# Patient Record
Sex: Female | Born: 1985 | Race: Black or African American | Hispanic: No | Marital: Single | Smoking: Never smoker
Health system: Southern US, Community
[De-identification: ages and names within clinical notes are randomized; demographics above are authoritative.]

## PROBLEM LIST (undated history)

## (undated) ENCOUNTER — Emergency Department (HOSPITAL_COMMUNITY): Admission: EM | Discharge status: Absent without leave | Payer: Medicare Other

## (undated) DIAGNOSIS — G9340 Encephalopathy, unspecified: Secondary | ICD-10-CM

## (undated) DIAGNOSIS — G809 Cerebral palsy, unspecified: Secondary | ICD-10-CM

## (undated) DIAGNOSIS — F79 Unspecified intellectual disabilities: Secondary | ICD-10-CM

## (undated) DIAGNOSIS — M419 Scoliosis, unspecified: Secondary | ICD-10-CM

## (undated) DIAGNOSIS — R6251 Failure to thrive (child): Secondary | ICD-10-CM

## (undated) DIAGNOSIS — R1310 Dysphagia, unspecified: Secondary | ICD-10-CM

## (undated) DIAGNOSIS — G8250 Quadriplegia, unspecified: Secondary | ICD-10-CM

## (undated) DIAGNOSIS — M810 Age-related osteoporosis without current pathological fracture: Secondary | ICD-10-CM

## (undated) DIAGNOSIS — K5900 Constipation, unspecified: Secondary | ICD-10-CM

## (undated) DIAGNOSIS — R569 Unspecified convulsions: Secondary | ICD-10-CM

## (undated) DIAGNOSIS — M8580 Other specified disorders of bone density and structure, unspecified site: Secondary | ICD-10-CM

## (undated) DIAGNOSIS — G9349 Other encephalopathy: Secondary | ICD-10-CM

## (undated) DIAGNOSIS — G40909 Epilepsy, unspecified, not intractable, without status epilepticus: Secondary | ICD-10-CM

## (undated) HISTORY — PX: BACK SURGERY: SHX140

## (undated) HISTORY — PX: OTHER SURGICAL HISTORY: SHX169

---
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
## 2006-06-02 ENCOUNTER — Ambulatory Visit (HOSPITAL_COMMUNITY): Admission: RE | Admit: 2006-06-02 | Discharge: 2006-06-02 | Payer: Self-pay | Admitting: Family Medicine

## 2006-08-01 ENCOUNTER — Encounter (HOSPITAL_BASED_OUTPATIENT_CLINIC_OR_DEPARTMENT_OTHER): Admission: RE | Admit: 2006-08-01 | Discharge: 2006-10-02 | Payer: Self-pay | Admitting: Surgery

## 2006-12-31 ENCOUNTER — Encounter: Admission: RE | Admit: 2006-12-31 | Discharge: 2006-12-31 | Payer: Self-pay | Admitting: Family Medicine

## 2007-01-19 ENCOUNTER — Ambulatory Visit (HOSPITAL_COMMUNITY): Admission: RE | Admit: 2007-01-19 | Discharge: 2007-01-19 | Payer: Self-pay | Admitting: Family Medicine

## 2007-07-08 ENCOUNTER — Encounter: Admission: RE | Admit: 2007-07-08 | Discharge: 2007-07-08 | Payer: Self-pay | Admitting: Family Medicine

## 2010-05-29 NOTE — Assessment & Plan Note (Signed)
[facility]   NAMECHARISSA, KNOWLES             ACCOUNT NO.:  [id number]   MEDICAL RECORD NO.:  [id number]      DATE OF BIRTH:  [birth year]   PHYSICIAN:  Theresia Majors. Tanda Rockers, M.D. VISIT DATE:  09/16/2006                                   OFFICE VISIT   SUBJECTIVE:  Ms. Stawicki is a 25 year old lady who we have followed for a  pressure ulcer on the posterior aspect of the right lower extremity.  In  the interim, she has been treated with topical Iodosorb.  There has been  no drainage, no fever, no malodor and no discoloration.  She returns  with her caretaker for follow-up.   Blood pressure 96/68, respirations 16, pulse rate 101, temperature 97.7.  Inspection of the wound shows that there is a healthy eschar covering  the wound.  There is no exudate.  There is no liquefaction.  There is no  evidence of associated infection or ischemia.   ASSESSMENT:  Resolved wound.   PLAN:  We have instructed the caretaker to continue antiseptic soap  washes daily, with anticipation that the eschar should separate  spontaneously.   PLAN:  We are discharging the patient.  We will follow up on a p.r.n.  basis, with anticipation that this wound should continue an  uncomplicated course toward healing.      Harold A. Tanda Rockers, M.D.  Electronically Signed     HAN/MEDQ  D:  09/16/2006  T:  09/16/2006  Job:  [id number]

## 2010-05-29 NOTE — Assessment & Plan Note (Signed)
[facility]   NAMEAMYLEE, LODATO             ACCOUNT NO.:  [id number]   MEDICAL RECORD NO.:  [id number]            DATE OF BIRTH:   PHYSICIAN:  Theresia Majors. Tanda Rockers, M.D. VISIT DATE:  08/19/2006                                   OFFICE VISIT   SUBJECTIVE:  Ms. Boerner is a 25 year old lady who we are following for  stage II pressure ulcer involving the right posterior leg.  In the  interim, we have treated her with offloading, local antiseptic soap  washing and topical Iodosorb.  She returns for followup.  There has been  no excessive drainage, malodor, pain or fever.  She is accompanied by  the medical attendant.   OBJECTIVE:  Blood pressure is 106/67, respirations 18, pulse rate 92,  temperature 97.  Inspection of the wound shows a dry, healthy-appearing  eschar, with scant drainage.  There is no inflammation.  No evidence of  secondary infection.  There is no vascular compromise.   ASSESSMENT:  Clinical improvement.   PLAN:  We will continue local care with antiseptic soap and topical  Iodosorb Gel.  We will reevaluate the patient in 1 month.  We anticipate  that this wound should be completely resolved in the interim and the  facility will likely cancel this appointment.  We have explained this  opinion and assessment to the attendant in terms that they seem to  understand.  We have given an opportunity to ask questions.  They  indicate that they would be compliant and expressed gratitude for having  been seen in the clinic.      Harold A. Tanda Rockers, M.D.  Electronically Signed     HAN/MEDQ  D:  08/19/2006  T:  08/19/2006  Job:  [id number]

## 2010-05-29 NOTE — Assessment & Plan Note (Signed)
[facility]   NAMEKADEJA, GRANADA             ACCOUNT NO.:  [id number]   MEDICAL RECORD NO.:  [id number]      DATE OF BIRTH:  [birth year]   PHYSICIAN:  Theresia Majors. Tanda Rockers, M.D. VISIT DATE:  08/05/2006                                   OFFICE VISIT   SUBJECTIVE/REASON FOR CONSULTATION:  The patient is a 25 year old,  retarded female referred by Dr. Dione Housekeeper for evaluation of a  nonhealing ulcer on the right leg.   ASSESSMENT:  Stage 2 pressure ulcer.   RECOMMENDATIONS:  A thorough debridement was performed in the wound  clinic in the static mixture of anesthesia and a outer sorb gel was  placed.  We have given written instructions for the wound be cleansed  daily with antiseptic soap, washed, thoroughly dried, and reapplication  of Iodosorb.  We will reevaluate the patient in 2 weeks to assess a  response to therapy.   SUBJECTIVE:  Emely Fahy  is a 25 year old who has recently been  transferred to the [facility]  in [place].  Upon arrival at the institution she had an ulceration  over the posterior area of the right lower extremity.  Wound care was  initiated with local cleansing, but in spite of these efforts, necrosis  and drainage persists.  She has had some mild hyperemia, but there is  been no fever.  There has been no malodor.  She has been on a course of  Levaquin.   PAST MEDICAL HISTORY:  Remarkable for her congenital retardation with  spastic quadriparesis, encephalopathy, seizure disorder and scoliosis.  She has had no major surgical procedures.   ALLERGIES:  She is not allergic to medication.   CURRENT MEDICATION LIST INCLUDES:  1. Carbatrol 600 mg b.i.d.  2. Senokot 1 tablet b.i.d.  3. Lactulose 25 mL b.i.d.  4. Depakote 500 mg t.i.d.  5. Baclofen 20 mg t.i.d.   FAMILY HISTORY:  The patient is nonverbal but the accompanying records  indicate that her father is Maribell Demeo of  [employer] in [place].   REVIEW OF SYSTEMS:  Not obtainable.   PHYSICAL EXAM:  GENERAL:  She is an obviously retarded female with  scoliosis and in a fetal position.  She is constantly moaning, but does  not appear to be responding to any noxious stimuli.  VITAL SIGNS:  Her blood pressure is 93/44, respirations 18, pulse rate  90, temperature is 98.5  HEENT:  Exam is remarkable for hyperostosis, drooling, and nystagmus.  NECK:  Trachea is midline.  Thyroid is nonpalpable.  LUNGS:  Clear.  The heart sounds are normal.  ABDOMEN:  Soft.  EXTREMITY EXAM:  Remarkable for palpable pulses at the dorsalis pedis  and the posterior tibia bilateral.  There is no alopecia there is a well  circumscribed ulceration measuring approximately 0.8 cm in diameter with  a liquefying eschar.  There is no malodor.  There is no cellulitis or  abscess formation.  Under EMLA block a full-thickness debridement was  performed.  There appears to be granulating tissue once the necrotic  tissue has been removed.  Hemorrhage was controlled with direct  pressure.  The wound was irrigated, Iodosorb was placed on the wound;  and a covering was  placed, a 4x4 and a fish net.   DISCUSSION:  The patient is accompanied by a caretaker.  We have  explained the wound care orders in terms that they seem to understand.  We have offered an opportunity to ask questions.  They seem to  understand and indicate that they will be proficient and duties for  carrying out the wound care, orders.  We will reevaluate the patient in  2 weeks to assess her response to therapy.      Harold A. Tanda Rockers, M.D.  Electronically Signed     HAN/MEDQ  D:  08/05/2006  T:  08/06/2006  Job:  [id number]   cc:   [id number] [facility]   [place] [address]. Dione Housekeeper

## 2011-03-13 ENCOUNTER — Encounter (HOSPITAL_COMMUNITY): Payer: Self-pay | Admitting: Emergency Medicine

## 2011-03-13 ENCOUNTER — Other Ambulatory Visit: Payer: Self-pay

## 2011-03-13 ENCOUNTER — Inpatient Hospital Stay (HOSPITAL_COMMUNITY)
Admission: EM | Admit: 2011-03-13 | Discharge: 2011-03-18 | DRG: 640 | Disposition: A | Discharge status: Home / Self Care | Payer: Medicare Other | Attending: Internal Medicine | Admitting: Internal Medicine

## 2011-03-13 ENCOUNTER — Emergency Department (HOSPITAL_COMMUNITY): Payer: Medicare Other

## 2011-03-13 DIAGNOSIS — R1310 Dysphagia, unspecified: Secondary | ICD-10-CM | POA: Diagnosis present

## 2011-03-13 DIAGNOSIS — R1319 Other dysphagia: Secondary | ICD-10-CM | POA: Diagnosis present

## 2011-03-13 DIAGNOSIS — R630 Anorexia: Secondary | ICD-10-CM | POA: Diagnosis present

## 2011-03-13 DIAGNOSIS — F79 Unspecified intellectual disabilities: Secondary | ICD-10-CM | POA: Diagnosis present

## 2011-03-13 DIAGNOSIS — E860 Dehydration: Secondary | ICD-10-CM

## 2011-03-13 DIAGNOSIS — N390 Urinary tract infection, site not specified: Secondary | ICD-10-CM

## 2011-03-13 DIAGNOSIS — D72829 Elevated white blood cell count, unspecified: Secondary | ICD-10-CM | POA: Diagnosis present

## 2011-03-13 DIAGNOSIS — A498 Other bacterial infections of unspecified site: Secondary | ICD-10-CM | POA: Diagnosis present

## 2011-03-13 DIAGNOSIS — G808 Other cerebral palsy: Secondary | ICD-10-CM | POA: Diagnosis present

## 2011-03-13 DIAGNOSIS — E870 Hyperosmolality and hypernatremia: Principal | ICD-10-CM | POA: Diagnosis present

## 2011-03-13 DIAGNOSIS — G8250 Quadriplegia, unspecified: Secondary | ICD-10-CM | POA: Diagnosis present

## 2011-03-13 DIAGNOSIS — Z681 Body mass index (BMI) 19 or less, adult: Secondary | ICD-10-CM

## 2011-03-13 DIAGNOSIS — R569 Unspecified convulsions: Secondary | ICD-10-CM | POA: Diagnosis present

## 2011-03-13 HISTORY — DX: Scoliosis, unspecified: M41.9

## 2011-03-13 HISTORY — DX: Unspecified convulsions: R56.9

## 2011-03-13 HISTORY — DX: Quadriplegia, unspecified: G82.50

## 2011-03-13 HISTORY — DX: Encephalopathy, unspecified: G93.40

## 2011-03-13 HISTORY — DX: Unspecified intellectual disabilities: F79

## 2011-03-13 LAB — BASIC METABOLIC PANEL
GFR calc Af Amer: >90 mL/min (ref >90)
GFR calc non Af Amer: >90 mL/min (ref >90)
Potassium: 3.5 mEq/L (ref 3.5–5.1)
Sodium: 151 mEq/L — ABNORMAL HIGH (ref 135–145)

## 2011-03-13 LAB — DIFFERENTIAL
Basophils Absolute: 0 10*3/uL (ref 0.0–0.1)
Basophils Relative: 0 % (ref 0–1)
Monocytes Relative: 23 % — ABNORMAL HIGH (ref 3–12)
Neutro Abs: 6 10*3/uL (ref 1.7–7.7)
Neutrophils Relative %: 53 % (ref 43–77)

## 2011-03-13 LAB — CBC
Hemoglobin: 14.6 g/dL (ref 12.0–15.0)
MCHC: 32.8 g/dL (ref 30.0–36.0)
Platelets: 234 10*3/uL (ref 150–400)
RDW: 13.8 % (ref 11.5–15.5)

## 2011-03-13 LAB — LACTIC ACID, PLASMA: Lactic Acid, Venous: 1.6 mmol/L (ref 0.5–2.2)

## 2011-03-13 MED ORDER — SODIUM CHLORIDE 0.9 % IV BOLUS (SEPSIS)
1000.0000 mL | Freq: Once | INTRAVENOUS | Status: AC
  Administered 2011-03-13: 1000 mL via INTRAVENOUS

## 2011-03-13 MED ORDER — WHITE PETROLATUM GEL
Status: AC
  Filled 2011-03-13: qty 5

## 2011-03-13 NOTE — ED Notes (Signed)
Patient transported to X-ray 

## 2011-03-13 NOTE — ED Provider Notes (Signed)
History     CSN: [id number]  Arrival date & time 03/13/11  1847   First MD Initiated Contact with Patient 03/13/11 1942      Chief Complaint  Patient presents with  . Weakness    (Consider location/radiation/quality/duration/timing/severity/associated sxs/prior treatment) HPI Brought in by her guardian with chief complaint of tachycardia and weakness not taking fluids and food it is normally does.  Patient has a chronic neurological condition similar to cerebral palsy which she requires constant medical attention and is wheelchair-bound.  She has also had some congestion and cough over the last few days.  No documented fever.  No nausea vomiting.  Past medical history includes spinal retardation spastic quadriplegic paresis and seizure disorder. Past Medical History  Diagnosis Date  . Mental retardation   . Spastic quadriparesis   . Seizures   . Scoliosis   . Encephalopathy   . Dysphagia   . Pressure ulcer 2008    right lower extremity    Past Surgical History  Procedure Date  . Hardware in back   . Back surgery     History reviewed. No pertinent family history.  History  Substance Use Topics  . Smoking status: Never Smoker   . Smokeless tobacco: Never Used  . Alcohol Use: No    OB History    Grav Para Term Preterm Abortions TAB SAB Ect Mult Living                  Review of Systems  Unable to perform ROS: Other    Allergies  Review of patient's allergies indicates no known allergies.  Home Medications   Current Outpatient Rx  Name Route Sig Dispense Refill  . BACLOFEN 20 MG PO TABS Oral Take 20 mg by mouth 3 (three) times daily.    Marland Kitchen BENZOYL PEROXIDE 10 % EX GEL Topical Apply 1 application topically 2 (two) times daily.    Marland Kitchen BISACODYL 10 MG RE SUPP Rectal Place 10 mg rectally every other day.    Marland Kitchen CALCIUM CARBONATE ANTACID 500 MG PO CHEW Oral Chew 2 tablets by mouth 2 (two) times daily.    Marland Kitchen CARBAMAZEPINE ER (ANTIPSYCH) 300 MG PO CP12 Oral Take 600 mg  by mouth 2 (two) times daily. May open capsule. Do not crush.    . CHLORHEXIDINE GLUCONATE 0.12 % MT SOLN Mouth/Throat Use as directed 10 mLs in the mouth or throat daily. Use 10ml to rinse or swab mouth daily with brushing.    Marland Kitchen DIAZEPAM 10 MG RE GEL Rectal Place 10 mg rectally once. For seizure lasting longer that 5 minutes . Call MD if not effective after 10 minutes.    . IBUPROFEN 600 MG PO TABS Oral Take 600 mg by mouth every 8 (eight) hours as needed. PAIN    . POLYETHYLENE GLYCOL 3350 PO PACK Oral Take 17 g by mouth daily.    . SCOPOLAMINE BASE 1.5 MG TD PT72 Transdermal Place 1 patch onto the skin every 3 (three) days. Behind ear.    . SENNA 8.6 MG PO TABS Oral Take 1 tablet by mouth 2 (two) times daily.     . EUCERIN EX CREA Topical Apply 1 application topically as needed. DRY SKIN    . FLEET ENEMA RE Rectal Place 1 Units rectally every three (3) days as needed. If no bowel movement.    Marland Kitchen TRIAZOLAM 0.25 MG PO TABS Oral Take 0.25 mg by mouth as needed. 1 tablet by mouth prior to dental appointment.  Do not administer until directed.    Marland Kitchen VALPROIC ACID 250 MG/5ML PO SYRP Oral Take 500 mg by mouth 3 (three) times daily.    Marland Kitchen VITAMIN D (ERGOCALCIFEROL) 50000 UNITS PO CAPS Oral Take 50,000 Units by mouth every 7 (seven) days. Open 1 capsule and squeeze contents into pudding, yogurt or applesauce once a week on Monday.    . ENSURE PUDDING PO PUDG Oral Take 1 Container by mouth 3 (three) times daily with meals.    Marland Kitchen FOOD THICKENER (THICKENUP CLEAR) Oral Take 120 g by mouth as needed.    . SULFAMETHOXAZOLE-TRIMETHOPRIM 200-40 MG/5ML PO SUSP Oral Take 20 mLs by mouth 2 (two) times daily. 100 mL     BP 123/86  Pulse 79  Temp(Src) 97.8 F (36.6 C) (Axillary)  Resp 15  Ht 5' (1.524 m)  Wt 88 lb 2.9 oz (40 kg)  BMI 17.22 kg/m2  SpO2 99%  Physical Exam  Nursing note and vitals reviewed. Constitutional: She appears well-developed and well-nourished. No distress.  HENT:  Head: Normocephalic  and atraumatic.  Mouth/Throat: Mucous membranes are dry.  Eyes: Pupils are equal, round, and reactive to light.  Neck: Normal range of motion.  Cardiovascular: Intact distal pulses.  Tachycardia present.   Pulmonary/Chest: Effort normal and breath sounds normal. No respiratory distress. She has no wheezes.  Abdominal: Normal appearance. She exhibits no distension.  Musculoskeletal: She exhibits no edema.       Chronic lower extremity contractures  Neurological: She is alert. No cranial nerve deficit.  Skin: Skin is warm and dry. No rash noted.    ED Course  Procedures (including critical care time) Scheduled Meds:   Continuous Infusions:   PRN Meds:.    Labs Reviewed  CBC - Abnormal; Notable for the following:    WBC 11.4 (*)    RBC 5.46 (*)    All other components within normal limits  DIFFERENTIAL - Abnormal; Notable for the following:    Monocytes Relative 23 (*)    Monocytes Absolute 2.6 (*)    All other components within normal limits  BASIC METABOLIC PANEL - Abnormal; Notable for the following:    Sodium 151 (*)    All other components within normal limits  URINALYSIS, ROUTINE W REFLEX MICROSCOPIC - Abnormal; Notable for the following:    APPearance TURBID (*)    Hgb urine dipstick LARGE (*)    All other components within normal limits  VALPROIC ACID LEVEL - Abnormal; Notable for the following:    Valproic Acid Lvl 48.6 (*)    All other components within normal limits  BASIC METABOLIC PANEL - Abnormal; Notable for the following:    Sodium 150 (*)    Chloride 116 (*)    Calcium 8.3 (*)    All other components within normal limits  CBC - Abnormal; Notable for the following:    WBC 17.4 (*)    All other components within normal limits  URINE MICROSCOPIC-ADD ON - Abnormal; Notable for the following:    Bacteria, UA MANY (*)    All other components within normal limits  BASIC METABOLIC PANEL - Abnormal; Notable for the following:    BUN 4 (*)    Creatinine, Ser  0.42 (*)    All other components within normal limits  BASIC METABOLIC PANEL - Abnormal; Notable for the following:    BUN 3 (*)    Creatinine, Ser 0.44 (*)    Calcium 8.2 (*)    All other components within  normal limits  CBC - Abnormal; Notable for the following:    HCT 35.7 (*)    All other components within normal limits  LACTIC ACID, PLASMA  URINE CULTURE  D-DIMER, QUANTITATIVE  MRSA PCR SCREENING  LAB REPORT - SCANNED   No results found.   1. Dehydration       MDM          Nelia Shi, MD 03/20/11 2239

## 2011-03-13 NOTE — ED Notes (Signed)
Unsuccessful when attempted to cath patient. No urine return.

## 2011-03-14 ENCOUNTER — Encounter (HOSPITAL_COMMUNITY): Payer: Self-pay | Admitting: Family Medicine

## 2011-03-14 ENCOUNTER — Emergency Department (HOSPITAL_COMMUNITY): Payer: Medicare Other

## 2011-03-14 DIAGNOSIS — E870 Hyperosmolality and hypernatremia: Secondary | ICD-10-CM | POA: Diagnosis present

## 2011-03-14 DIAGNOSIS — G8250 Quadriplegia, unspecified: Secondary | ICD-10-CM | POA: Diagnosis present

## 2011-03-14 DIAGNOSIS — R1319 Other dysphagia: Secondary | ICD-10-CM | POA: Diagnosis present

## 2011-03-14 DIAGNOSIS — R569 Unspecified convulsions: Secondary | ICD-10-CM | POA: Diagnosis present

## 2011-03-14 LAB — CBC
HCT: 40.3 % (ref 36.0–46.0)
Hemoglobin: 13.3 g/dL (ref 12.0–15.0)
MCH: 26.9 pg (ref 26.0–34.0)
MCHC: 33 g/dL (ref 30.0–36.0)
RDW: 13.9 % (ref 11.5–15.5)

## 2011-03-14 LAB — URINALYSIS, ROUTINE W REFLEX MICROSCOPIC
Nitrite: NEGATIVE
Protein, ur: 30 mg/dL
Specific Gravity, Urine: 1.01 (ref 1.005–1.030)
Urobilinogen, UA: 0.2 mg/dL (ref 0.0–1.0)

## 2011-03-14 LAB — URINE MICROSCOPIC-ADD ON

## 2011-03-14 LAB — BASIC METABOLIC PANEL
BUN: 12 mg/dL (ref 6–23)
Creatinine, Ser: 0.55 mg/dL (ref 0.50–1.10)
GFR calc non Af Amer: >90 mL/min (ref >90)
Glucose, Bld: 79 mg/dL (ref 70–99)
Potassium: 4.2 mEq/L (ref 3.5–5.1)

## 2011-03-14 LAB — VALPROIC ACID LEVEL: Valproic Acid Lvl: 48.6 ug/mL — ABNORMAL LOW (ref 50.0–100.0)

## 2011-03-14 MED ORDER — IOHEXOL 300 MG/ML  SOLN
80.0000 mL | Freq: Once | INTRAMUSCULAR | Status: AC | PRN
  Administered 2011-03-14: 80 mL via INTRAVENOUS

## 2011-03-14 MED ORDER — IBUPROFEN 600 MG PO TABS
600.0000 mg | ORAL_TABLET | Freq: Three times a day (TID) | ORAL | Status: DC | PRN
  Administered 2011-03-14 (×2): 600 mg via ORAL
  Filled 2011-03-14 (×2): qty 1

## 2011-03-14 MED ORDER — POTASSIUM CHLORIDE IN NACL 20-0.9 MEQ/L-% IV SOLN
INTRAVENOUS | Status: DC
  Administered 2011-03-14: 04:00:00 via INTRAVENOUS
  Filled 2011-03-14 (×4): qty 1000

## 2011-03-14 MED ORDER — POTASSIUM CHLORIDE IN NACL 20-0.9 MEQ/L-% IV SOLN
INTRAVENOUS | Status: AC
  Administered 2011-03-14 – 2011-03-15 (×2): via INTRAVENOUS
  Filled 2011-03-14 (×2): qty 1000

## 2011-03-14 MED ORDER — POTASSIUM CHLORIDE IN NACL 20-0.9 MEQ/L-% IV SOLN
INTRAVENOUS | Status: AC
  Administered 2011-03-15: 17:00:00 via INTRAVENOUS
  Filled 2011-03-14 (×2): qty 1000

## 2011-03-14 MED ORDER — BACLOFEN 20 MG PO TABS
20.0000 mg | ORAL_TABLET | Freq: Three times a day (TID) | ORAL | Status: DC
  Administered 2011-03-14 – 2011-03-18 (×9): 20 mg via ORAL
  Filled 2011-03-14 (×17): qty 1

## 2011-03-14 MED ORDER — SCOPOLAMINE 1 MG/3DAYS TD PT72
1.0000 | MEDICATED_PATCH | TRANSDERMAL | Status: DC
  Administered 2011-03-14 – 2011-03-17 (×2): 1.5 mg via TRANSDERMAL
  Filled 2011-03-14 (×3): qty 1

## 2011-03-14 MED ORDER — VALPROIC ACID 250 MG/5ML PO SYRP
500.0000 mg | ORAL_SOLUTION | Freq: Three times a day (TID) | ORAL | Status: DC
  Administered 2011-03-14 – 2011-03-16 (×5): 500 mg via ORAL
  Filled 2011-03-14 (×12): qty 10

## 2011-03-14 MED ORDER — SODIUM CHLORIDE 0.45 % IV BOLUS
500.0000 mL | Freq: Once | INTRAVENOUS | Status: AC
  Administered 2011-03-14: 500 mL via INTRAVENOUS

## 2011-03-14 MED ORDER — SENNA 8.6 MG PO TABS
1.0000 | ORAL_TABLET | Freq: Two times a day (BID) | ORAL | Status: DC
Start: 2011-03-14 — End: 2011-03-18
  Administered 2011-03-14 – 2011-03-18 (×5): 8.6 mg via ORAL
  Filled 2011-03-14 (×5): qty 1

## 2011-03-14 MED ORDER — ENOXAPARIN SODIUM 40 MG/0.4ML ~~LOC~~ SOLN
40.0000 mg | SUBCUTANEOUS | Status: DC
  Administered 2011-03-14 – 2011-03-15 (×2): 40 mg via SUBCUTANEOUS
  Filled 2011-03-14 (×3): qty 0.4

## 2011-03-14 MED ORDER — BISACODYL 10 MG RE SUPP
10.0000 mg | RECTAL | Status: DC
  Administered 2011-03-14 – 2011-03-18 (×3): 10 mg via RECTAL
  Filled 2011-03-14 (×3): qty 1

## 2011-03-14 MED ORDER — CARBAMAZEPINE ER 200 MG PO CP12
600.0000 mg | ORAL_CAPSULE | Freq: Two times a day (BID) | ORAL | Status: DC
  Administered 2011-03-14 (×2): 600 mg via ORAL
  Filled 2011-03-14: qty 3
  Filled 2011-03-14: qty 2
  Filled 2011-03-14: qty 3
  Filled 2011-03-14: qty 2

## 2011-03-14 MED ORDER — DIAZEPAM 10 MG RE GEL
10.0000 mg | Freq: Once | RECTAL | Status: AC
  Administered 2011-03-14: 10 mg via RECTAL
  Filled 2011-03-14: qty 10

## 2011-03-14 MED ORDER — POLYETHYLENE GLYCOL 3350 17 G PO PACK
17.0000 g | PACK | Freq: Every day | ORAL | Status: DC
  Administered 2011-03-14 – 2011-03-18 (×4): 17 g via ORAL
  Filled 2011-03-14 (×5): qty 1

## 2011-03-14 NOTE — ED Notes (Signed)
Called 4W to have urology nurse come attempt Foley insertion.

## 2011-03-14 NOTE — ED Notes (Signed)
Pt had a bowel movement and was cleaned by rn and tech. Pt still yelling when no one is in room.

## 2011-03-14 NOTE — ED Notes (Signed)
Pt alert at her baseline. Respirations even and unlabored, bilateral symmetrical rise and fall of chest. Skin warm and dry. In no acute distress. Pt does not talk.

## 2011-03-14 NOTE — ED Notes (Signed)
Guardian called and is coming to sit with patient.

## 2011-03-14 NOTE — ED Notes (Signed)
Pt makes noise when a staff member in not in the room, as soon as a staff member comes into the room. rn and tech have repositioned pt, turned lights on, turned them off, turned on the tv, but when staff walk out of room pt starts yelling again.

## 2011-03-14 NOTE — ED Notes (Signed)
Patient has urinated moderate amount moderate amount of urine. No urine noted in Foley drainage bag. Dr. Joneen Roach notified.

## 2011-03-14 NOTE — ED Notes (Signed)
Foley catheter not draining urine. Patient has voided large amount of urine. Foley removed. New foley reinserted. No urine returned on 2nd attempt. Bladder scan shows 33ml. Foley left in place to assess for urine output.

## 2011-03-14 NOTE — ED Notes (Signed)
Pt peed on herself. catheter is not working. Will alert md.

## 2011-03-14 NOTE — ED Notes (Signed)
Patient transported to CT 

## 2011-03-14 NOTE — ED Notes (Signed)
Again, Foley noted to be not draining urine. Patient has small amount in bed. Foley catheter removed. Pericare care performed and linens changed. Foley re-inserted by T. Doster, RN with no urine returned.

## 2011-03-14 NOTE — ED Notes (Signed)
Pt volunteer at bedside talking and sitting with pt. We are providing pt with applejuicfe.

## 2011-03-14 NOTE — H&P (Signed)
PCP:   No primary provider on file.   Chief Complaint:  Cough and decreased level of responsiveness  HPI: This is a 26 year old female, she has some mental retardation and cerebral palsy. She is a resident of group home, completely nonverbal. She is usually awake and happy. Over the past few days, she's become listless and coughing. She's not been eating, she cried all weekend. She's been scratching herself and biting herself. She was brought to the ER for evaluation. In the ER the patient is dehydrated with a sodium of 151. It is difficult getting any further information because of the patient's nonverbal baseline. In the ER the patient was persistently tachycardic. After 2 L of fluid resuscitation. The ears as been unsuccessful in getting a UA, even after Foley is in place. The hospitalist service has been called for evaluation. Patient has a wet coarse sounding cough.  Review of Systems:  Unable to obtain due to patient's mentation  Past Medical History: Past Medical History  Diagnosis Date  . Mental retardation   . Spastic quadriparesis   . Seizures   . Scoliosis   . Encephalopathy   . Dysphagia    Past Surgical History  Procedure Date  . Hardware in back     Medications: Prior to Admission medications   Medication Sig Start Date End Date Taking? Authorizing Provider  baclofen (LIORESAL) 20 MG tablet Take 20 mg by mouth 3 (three) times daily.   Yes Historical Provider, MD  benzoyl peroxide 10 % gel Apply 1 application topically 2 (two) times daily.   Yes Historical Provider, MD  bisacodyl (DULCOLAX) 10 MG suppository Place 10 mg rectally every other day.   Yes Historical Provider, MD  calcium carbonate (TUMS - DOSED IN MG ELEMENTAL CALCIUM) 500 MG chewable tablet Chew 2 tablets by mouth 2 (two) times daily.   Yes Historical Provider, MD  Carbamazepine, Antipsychotic, (EQUETRO) 300 MG CP12 Take 600 mg by mouth 2 (two) times daily. May open capsule. Do not crush.   Yes Historical  Provider, MD  chlorhexidine (PERIDEX) 0.12 % solution Use as directed 10 mLs in the mouth or throat daily. Use 10ml to rinse or swab mouth daily with brushing.   Yes Historical Provider, MD  diazepam (DIASTAT ACUDIAL) 10 MG GEL Place 10 mg rectally once. For seizure lasting longer that 5 minutes . Call MD if not effective after 10 minutes.   Yes Historical Provider, MD  ibuprofen (ADVIL,MOTRIN) 600 MG tablet Take 600 mg by mouth every 8 (eight) hours as needed. PAIN   Yes Historical Provider, MD  polyethylene glycol (MIRALAX / GLYCOLAX) packet Take 17 g by mouth daily.   Yes Historical Provider, MD  scopolamine (TRANSDERM-SCOP) 1.5 MG Place 1 patch onto the skin every 3 (three) days. Behind ear.   Yes Historical Provider, MD  senna (SENOKOT) 8.6 MG TABS Take 1 tablet by mouth 2 (two) times daily.    Yes Historical Provider, MD  Skin Protectants, Misc. (EUCERIN) cream Apply 1 application topically as needed. DRY SKIN   Yes Historical Provider, MD  Sodium Phosphates (FLEET ENEMA RE) Place 1 Units rectally every three (3) days as needed. If no bowel movement.   Yes Historical Provider, MD  triazolam (HALCION) 0.25 MG tablet Take 0.25 mg by mouth as needed. 1 tablet by mouth prior to dental appointment.   Do not administer until directed.   Yes Historical Provider, MD  Valproic Acid (DEPAKENE) 250 MG/5ML SYRP syrup Take 500 mg by mouth 3 (three)  times daily.   Yes Historical Provider, MD  Vitamin D, Ergocalciferol, (DRISDOL) 50000 UNITS CAPS Take 50,000 Units by mouth every 7 (seven) days. Open 1 capsule and squeeze contents into pudding, yogurt or applesauce once a week on Monday.   Yes Historical Provider, MD    Allergies:  No Known Allergies  Social History:  reports that she has never smoked. She does not have any smokeless tobacco history on file. She reports that she does not drink alcohol or use illicit drugs.  Family History: History reviewed. No pertinent family history.  Physical  Exam: Filed Vitals:   03/13/11 1848 03/13/11 1857 03/14/11 0048  BP:  130/98 113/70  Pulse:  118 124  Temp:  97.9 F (36.6 C) 100.1 F (37.8 C)  TempSrc:  Axillary Rectal  Resp:  24 22  SpO2: 93% 100% 96%    General:  Alert , no acute distress Eyes: PERRLA, pink conjunctiva, no scleral icterus ENT: dry crusted oral mucosa, neck supple, no thyromegaly Lungs: clear to ascultation, no wheeze, no crackles, no use of accessory muscles Cardiovascular: regular rate and rhythm, no regurgitation, no gallops, no murmurs. No carotid bruits, no JVD Abdomen: soft, positive BS, non-tender, non-distended, no organomegaly, not an acute abdomen GU: not examined Neuro: CN II - XII unable to assess  Musculoskeletal: Contracted lower extremities but no edema Skin: no rash, no subcutaneous crepitation, no decubitus    Labs on Admission:   [facility] 03/13/11 2045  NA 151*  K 3.5  CL 111  CO2 32  GLUCOSE 99  BUN 15  CREATININE 0.63  CALCIUM 9.5  MG --  PHOS --   No results found for this basename: AST:2,ALT:2,ALKPHOS:2,BILITOT:2,PROT:2,ALBUMIN:2 in the last 72 hours No results found for this basename: LIPASE:2,AMYLASE:2 in the last 72 hours  Basename 03/13/11 2045  WBC 11.4*  NEUTROABS 6.0  HGB 14.6  HCT 44.5  MCV 81.5  PLT 234   No results found for this basename: CKTOTAL:3,CKMB:3,CKMBINDEX:3,TROPONINI:3 in the last 72 hours No components found with this basename: POCBNP:3 No results found for this basename: DDIMER:2 in the last 72 hours No results found for this basename: HGBA1C:2 in the last 72 hours No results found for this basename: CHOL:2,HDL:2,LDLCALC:2,TRIG:2,CHOLHDL:2,LDLDIRECT:2 in the last 72 hours No results found for this basename: TSH,T4TOTAL,FREET3,T3FREE,THYROIDAB in the last 72 hours No results found for this basename: VITAMINB12:2,FOLATE:2,FERRITIN:2,TIBC:2,IRON:2,RETICCTPCT:2 in the last 72 hours  Micro Results: No results found for this or any previous visit  (from the past 240 hour(s)).   Radiological Exams on Admission: Dg Chest 2 View  03/13/2011  *RADIOLOGY REPORT*  Clinical Data: 26 year old female with chest congestion and cough.  CHEST - 2 VIEW  Comparison: 07/08/2007.  Findings: Semi upright AP and lateral views of the chest.  Chronic posterior spinal hardware.  Low lung volumes.  Cardiac size and mediastinal contours are within normal limits.  The patient's arms are not raised on the lateral view limiting its utility.  No pneumothorax, pulmonary edema, pleural effusion, or definite airspace opacity.  IMPRESSION: No definite acute cardiopulmonary abnormality.  Original Report Authenticated By: Harley Hallmark, M.D.    Assessment/Plan Present on Admission:  .Acute hypernatremia  Anorexia Admit to telemetry Patient not eating, possibly due to infection, UA ordered still unable to obtain Will order blood cultures. Foley is in place  Tachycardia -persistent Ordered a d-dimer and CT chest a PE protocol, a d-dimer elevated Spastic quadriplegia Dysphagia Seizures  Resume home medications, check valproic acid level, rule out toxicity  Full  code DVT prophylaxis Team 1/Dr. Sammuel Cooper, Jacqualin Shirkey 03/14/2011, 1:36 AM

## 2011-03-14 NOTE — Progress Notes (Signed)
confirmed with [facility] RN, Neita Carp, pcp who generally sees pt at [facility] facility is Dione Housekeeper

## 2011-03-14 NOTE — Progress Notes (Signed)
Nursing with difficulty obtaining urine due to incorrect cath placement. Patient urinating around catheter. Nursing from urology floor to coma and attempt foley cath placement

## 2011-03-14 NOTE — ED Notes (Signed)
No urine return on foley insert. Will monitor, when urine is produced sample will be collected.

## 2011-03-15 LAB — BASIC METABOLIC PANEL
CO2: 23 mEq/L (ref 19–32)
Glucose, Bld: 77 mg/dL (ref 70–99)
Potassium: 3.9 mEq/L (ref 3.5–5.1)
Sodium: 142 mEq/L (ref 135–145)

## 2011-03-15 LAB — MRSA PCR SCREENING: MRSA by PCR: NEGATIVE

## 2011-03-15 MED ORDER — MORPHINE SULFATE 2 MG/ML IJ SOLN
1.0000 mg | Freq: Four times a day (QID) | INTRAMUSCULAR | Status: DC | PRN
  Administered 2011-03-15: 1 mg via INTRAVENOUS
  Filled 2011-03-15: qty 1

## 2011-03-15 MED ORDER — CARBAMAZEPINE 200 MG PO TABS
400.0000 mg | ORAL_TABLET | Freq: Three times a day (TID) | ORAL | Status: DC
  Administered 2011-03-15 – 2011-03-18 (×8): 400 mg via ORAL
  Filled 2011-03-15 (×12): qty 2

## 2011-03-15 MED ORDER — LORAZEPAM 2 MG/ML IJ SOLN
1.0000 mg | Freq: Four times a day (QID) | INTRAMUSCULAR | Status: DC | PRN
  Administered 2011-03-15 – 2011-03-18 (×6): 1 mg via INTRAVENOUS
  Filled 2011-03-15 (×6): qty 1

## 2011-03-15 MED ORDER — ENSURE PUDDING PO PUDG
1.0000 | Freq: Three times a day (TID) | ORAL | Status: DC
  Administered 2011-03-15 – 2011-03-17 (×6): 1 via ORAL
  Filled 2011-03-15 (×10): qty 1

## 2011-03-15 NOTE — Progress Notes (Signed)
Pt's Foley not draining any urine. Pt having incontinent episodes around Foley. Attempted to insert second catheter, unable to properly place and pt started having bleeding upon inserted. Both Foleys d/ced.  Daphine Deutscher, Miranda Fairgrove

## 2011-03-15 NOTE — Plan of Care (Signed)
Problem: Phase I Progression Outcomes Goal: Initial discharge plan identified Outcome: Completed/Met Date Met:  03/15/11 Pt's guardian plans for her to return to group home

## 2011-03-15 NOTE — Progress Notes (Signed)
INITIAL ADULT NUTRITION ASSESSMENT Date: 03/15/2011   Time: 2:04 PM Reason for Assessment: consult  ASSESSMENT: Female 26 y.o.  Dx: cough, decreased level of responsiveness  Hx:  Past Medical History  Diagnosis Date  . Mental retardation   . Spastic quadriparesis   . Seizures   . Scoliosis   . Encephalopathy   . Dysphagia   . Pressure ulcer 2008    right lower extremity   Past Surgical History  Procedure Date  . Hardware in back   . Back surgery     Related Meds:  Scheduled Meds:   . baclofen  20 mg Oral TID  . bisacodyl  10 mg Rectal QODAY  . carbamazepine  400 mg Oral TID  . enoxaparin  40 mg Subcutaneous Q24H  . polyethylene glycol  17 g Oral Daily  . scopolamine  1 patch Transdermal Q72H  . senna  1 tablet Oral BID  . Valproic Acid  500 mg Oral TID  . DISCONTD: carbamazepine  600 mg Oral BID   Continuous Infusions:   . 0.9 % NaCl with KCl 20 mEq / L 100 mL/hr at 03/15/11 0511  . 0.9 % NaCl with KCl 20 mEq / L 75 mL/hr at 03/15/11 0851  . DISCONTD: 0.9 % NaCl with KCl 20 mEq / L 75 mL/hr at 03/14/11 0339   PRN Meds:.ibuprofen, LORazepam, morphine injection   Ht: 5' (152.4 cm) (per family)  Wt: 88 lb 2.9 oz (40 kg)  Ideal Wt: 100 lbs % Ideal Wt: 88%  Usual Wt: unable to assess % Usual Wt:   Body mass index is 17.22 kg/(m^2).  Food/Nutrition Related Hx: anorexia PTA  Labs:  CMP     Component Value Date/Time   NA 142 03/15/2011 0814   K 3.9 03/15/2011 0814   CL 111 03/15/2011 0814   CO2 23 03/15/2011 0814   GLUCOSE 77 03/15/2011 0814   BUN 4* 03/15/2011 0814   CREATININE 0.42* 03/15/2011 0814   CALCIUM 8.7 03/15/2011 0814   GFRNONAA >90 03/15/2011 0814   GFRAA >90 03/15/2011 0814    Intake: 20-40% Output: 1 BM today  Intake/Output Summary (Last 24 hours) at 03/15/11 1414 Last data filed at 03/15/11 1300  Gross per 24 hour  Intake 1316.67 ml  Output      0 ml  Net 1316.67 ml     Diet Order: Dysphagia 1, honey thick liquids  Supplements/Tube  Feeding:  IVF:    0.9 % NaCl with KCl 20 mEq / L Last Rate: 100 mL/hr at 03/15/11 0511  0.9 % NaCl with KCl 20 mEq / L Last Rate: 75 mL/hr at 03/15/11 0851  DISCONTD: 0.9 % NaCl with KCl 20 mEq / L Last Rate: 75 mL/hr at 03/14/11 0339    Estimated Nutritional Needs:   Kcal: 1200-1300 kcal Protein: 48-56g Fluid: ~1.5 L/day  Pt admitted with decreased level of consciousness.  Family reported deceased appetite and intake PTA.  PO has ben 20-40% of meals since admission.  Pt restless- unsure of activity level at baseline which could contribute to increased energy expenditure and difficulty gaining wt.  No family at bedside to discuss nutrition hx and pt's baseline.  No wt hx available from group home.  NUTRITION DIAGNOSIS: -Inadequate oral intake (NI-2.1).  Status: Ongoing  RELATED TO: anorexia  AS EVIDENCE BY: family reported on admission  MONITORING/EVALUATION(Goals): 1.  Food/Beverage; pt eating per her usual. 2.  Wt/wt change; deter loss  EDUCATION NEEDS: -No education needs identified at  this time  INTERVENTION: 1. Supplements; pt not on any nutrition supplements at group home.  Will order Ensure pudding to be given with medications. Pt typically takes meds with yogurt- may substitute if pt prefers.  Dietitian #: [phone]  DOCUMENTATION CODES Per approved criteria  -Underweight    Sheila Costa 03/15/2011, 2:04 PM

## 2011-03-15 NOTE — Progress Notes (Signed)
Pt still restless, thrashing around in bed, banging body against side rails knocking off padding, taking telemetry box off every 5 minutes and throwing it in floor, and screaming. Pt not responding to Pain medication, darkened room, or soothing music. HR in 130's. MD notified, new orders received.  Daphine Deutscher, Miranda [place]

## 2011-03-15 NOTE — Progress Notes (Signed)
CSW spoke with [facility] @ [facility] (ph#: [phone]) re: discharge planning. CSW was anticipating discharge over the weekend, though [facility] Loser stated they would not be able to take patient back over the weekend due to lack of admission nurse and pharmacy. MD aware.   Unice Bailey, LCSWA [phone]

## 2011-03-15 NOTE — Progress Notes (Signed)
Subjective: In bed, seems somewhat agitated. Not verbally responsive.  Objective: Vital signs in last 24 hours: Temp:  [97.6 F (36.4 C)-98.6 F (37 C)] 98.2 F (36.8 C) (03/01 1331) Pulse Rate:  [74-118] 107  (03/01 1331) Resp:  [18-20] 18  (03/01 1331) BP: (110-145)/(73-93) 125/85 mmHg (03/01 1331) SpO2:  [92 %-99 %] 99 % (03/01 1331) Weight:  [40 kg (88 lb 2.9 oz)] 40 kg (88 lb 2.9 oz) (02/28 1724) Weight change:  Last BM Date: 03/15/11  Intake/Output from previous day: 02/28 0701 - 03/01 0700 In: 1196.7 [P.O.:120; I.V.:1076.7] Out: -  Total I/O In: 120 [P.O.:120] Out: -    Physical Exam: General: Alert, awake, oriented x3, in no acute distress. HEENT: No bruits, no goiter. Heart: Regular rate and rhythm, without murmurs, rubs, gallops. Lungs: Bilateral ronchi Abdomen: Soft, nontender, nondistended, positive bowel sounds. Extremities: No clubbing cyanosis or edema with positive pedal pulses. Neuro: Grossly intact, nonfocal.    Lab Results: Basic Metabolic Panel:  Basename 03/15/11 0814 03/14/11 0520  NA 142 150*  K 3.9 4.2  CL 111 116*  CO2 23 25  GLUCOSE 77 79  BUN 4* 12  CREATININE 0.42* 0.55  CALCIUM 8.7 8.3*  MG -- --  PHOS -- --   CBC:  Basename 03/14/11 0520 03/13/11 2045  WBC 17.4* 11.4*  NEUTROABS -- 6.0  HGB 13.3 14.6  HCT 40.3 44.5  MCV 81.6 81.5  PLT 184 234   D-Dimer:  Basename 03/14/11 0219  DDIMER <0.22   Urinalysis:  Basename 03/14/11 1119  COLORURINE YELLOW  LABSPEC 1.010  PHURINE 7.5  GLUCOSEU NEGATIVE  HGBUR LARGE*  BILIRUBINUR NEGATIVE  KETONESUR NEGATIVE  PROTEINUR 30  UROBILINOGEN 0.2  NITRITE NEGATIVE  LEUKOCYTESUR NEGATIVE    Recent Results (from the past 240 hour(s))  MRSA PCR SCREENING     Status: Normal   Collection Time   03/14/11  8:37 PM      Component Value Range Status Comment   MRSA by PCR NEGATIVE  NEGATIVE  Final     Studies/Results: Dg Chest 2 View  03/13/2011  *RADIOLOGY REPORT*   Clinical Data: 26 year old female with chest congestion and cough.  CHEST - 2 VIEW  Comparison: 07/08/2007.  Findings: Semi upright AP and lateral views of the chest.  Chronic posterior spinal hardware.  Low lung volumes.  Cardiac size and mediastinal contours are within normal limits.  The patient's arms are not raised on the lateral view limiting its utility.  No pneumothorax, pulmonary edema, pleural effusion, or definite airspace opacity.  IMPRESSION: No definite acute cardiopulmonary abnormality.  Original Report Authenticated By: Harley Hallmark, M.D.   Ct Angio Chest W/cm &/or Wo Cm  03/14/2011  *RADIOLOGY REPORT*  Clinical Data: Weakness, cough and crying; tachycardia. Leukocytosis.  CT ANGIOGRAPHY CHEST  Technique:  Multidetector CT imaging of the chest using the standard protocol during bolus administration of intravenous contrast. Multiplanar reconstructed images including MIPs were obtained and reviewed to evaluate the vascular anatomy.  Contrast: 80mL OMNIPAQUE IOHEXOL 300 MG/ML IJ SOLN  Comparison: Chest radiograph performed 03/13/2011  Findings: There is no evidence of significant pulmonary embolus.  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  Minimal surrounding atelectasis is noted.  Minimal left basilar atelectasis is also seen.  There is no evidence of significant focal consolidation, pleural effusion or pneumothorax.  The mediastinum is unremarkable in appearance.  No mediastinal lymphadenopathy is seen.  No pericardial effusion is identified.  The great vessels are grossly unremarkable in appearance.  No axillary lymphadenopathy is seen.  The visualized portions of the thyroid gland are unremarkable in appearance.  The visualized portions of the liver and spleen are unremarkable. The course of the esophagus is difficult to fully characterize, but no focal mass is seen.  There is asymmetric prominence of the right sternocleidomastoid  muscle; this is thought to be chronic in nature, without a focal mass.  No acute osseous abnormalities are seen.  Thoracolumbar spinal fusion rods are partially imaged.  IMPRESSION:  1.  No evidence of significant pulmonary embolus. 2.  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 3.  Minimal bibasilar atelectasis noted. 4.  Asymmetric prominence of the right sternocleidomastoid muscle is thought to reflect chronic hypertrophy, without a focal mass.  Original Report Authenticated By: Tonia Ghent, M.D.    Medications: Scheduled Meds:   . baclofen  20 mg Oral TID  . bisacodyl  10 mg Rectal QODAY  . carbamazepine  400 mg Oral TID  . enoxaparin  40 mg Subcutaneous Q24H  . polyethylene glycol  17 g Oral Daily  . scopolamine  1 patch Transdermal Q72H  . senna  1 tablet Oral BID  . Valproic Acid  500 mg Oral TID  . DISCONTD: carbamazepine  600 mg Oral BID   Continuous Infusions:   . 0.9 % NaCl with KCl 20 mEq / L 100 mL/hr at 03/15/11 0511  . 0.9 % NaCl with KCl 20 mEq / L 75 mL/hr at 03/15/11 0851  . DISCONTD: 0.9 % NaCl with KCl 20 mEq / L 75 mL/hr at 03/14/11 0339   PRN Meds:.ibuprofen, LORazepam, morphine injection  Assessment/Plan:  Active Problems:  Acute hypernatremia  Spastic quadriplegia  Seizure  Dysphagia   #1 Hypernatremia: Improved with IVF. Was definitely hypovolemic on admission and significantly dehydrated. Continue IVF for now.  #2 Leukocytosis: Source unclear. Has an equivocal U/A, will await culture data before treating.   LOS: 2 days   [facility] Pager: [phone] 03/15/2011, 2:21 PM

## 2011-03-15 NOTE — Progress Notes (Signed)
Speech Language/Pathology SLP Cancellation Note [phone]  Treatment cancelled today due to order for SLE received.  SLP paged MD to get clarification on order, BSE versus SLE.  Note pt on puree/honey thick diet at group home, as well as here at [facility].  Chest CT 2/28 and CXR negative for pulmonary infection.  Spoke to nurse who worked with pt last night, who verbalized pt coughing, but not associated with po intake.  SLP to follow up next date for clarification.    Chales Abrahams 03/15/2011, 3:01 PM [phone]

## 2011-03-15 NOTE — Progress Notes (Signed)
Pt's HR in 120/130s and pt thrashing around in bed and moaning and screaming at times, unrelieved by ibuprofen. MD notified, new orders received for pain medication, will continue to monitor patient.  Daphine Deutscher, Miranda [place]

## 2011-03-16 LAB — CBC
HCT: 35.7 % — ABNORMAL LOW (ref 36.0–46.0)
Hemoglobin: 12 g/dL (ref 12.0–15.0)
MCH: 26.8 pg (ref 26.0–34.0)
MCHC: 33.6 g/dL (ref 30.0–36.0)
RBC: 4.47 MIL/uL (ref 3.87–5.11)

## 2011-03-16 LAB — BASIC METABOLIC PANEL
BUN: 3 mg/dL — ABNORMAL LOW (ref 6–23)
Chloride: 111 mEq/L (ref 96–112)
GFR calc Af Amer: >90 mL/min (ref >90)
Glucose, Bld: 79 mg/dL (ref 70–99)
Potassium: 3.7 mEq/L (ref 3.5–5.1)
Sodium: 141 mEq/L (ref 135–145)

## 2011-03-16 LAB — URINE CULTURE

## 2011-03-16 MED ORDER — ENOXAPARIN SODIUM 30 MG/0.3ML ~~LOC~~ SOLN
30.0000 mg | SUBCUTANEOUS | Status: DC
  Administered 2011-03-16 – 2011-03-18 (×3): 30 mg via SUBCUTANEOUS
  Filled 2011-03-16 (×3): qty 0.3

## 2011-03-16 MED ORDER — STARCH (THICKENING) PO POWD
ORAL | Status: DC | PRN
  Filled 2011-03-16: qty 227

## 2011-03-16 MED ORDER — FOOD THICKENER (THICKENUP CLEAR)
ORAL | Status: DC | PRN
  Filled 2011-03-16: qty 120

## 2011-03-16 MED ORDER — DEXTROSE 5 % IV SOLN
1.0000 g | Freq: Every day | INTRAVENOUS | Status: DC
  Administered 2011-03-16 – 2011-03-17 (×2): 1 g via INTRAVENOUS
  Filled 2011-03-16 (×4): qty 10

## 2011-03-16 NOTE — Plan of Care (Signed)
Pt observed upon initial rounds-alert; however, not comprehensible to person; place & time; not able to follow simple commands. Pt has  hx mental retardation; quadraplegic.  Pt very active in bed; moves all limbs w/ limitations.  Repositioned to bed; side rails up(padded for safety).  Telemetry box @ bedside; unable to keep on patient appropriately, due to constant movement/activity in bed. Incontinent large volume of urine-peri care given. IV 0.9NS for IV antibiotic flush only; NSL to LFA; NSL to L anteicubital-flushed & patent.  Will continue to monitor.AWl

## 2011-03-16 NOTE — Progress Notes (Signed)
Subjective: Quieter today.  Objective: Vital signs in last 24 hours: Temp:  [98.2 F (36.8 C)-98.7 F (37.1 C)] 98.7 F (37.1 C) (03/02 0515) Pulse Rate:  [101-127] 101  (03/02 0515) Resp:  [18] 18  (03/02 0515) BP: (125-133)/(79-85) 133/81 mmHg (03/02 0515) SpO2:  [97 %-99 %] 97 % (03/02 0515) Weight change:  Last BM Date: 03/15/11  Intake/Output from previous day: 03/01 0701 - 03/02 0700 In: 581.3 [P.O.:120; I.V.:461.3] Out: -      Physical Exam: General: Sleeping, spastic quadriplegia. HEENT: No bruits, no goiter. Heart: Regular rate and rhythm, without murmurs, rubs, gallops. Lungs: Bilateral ronchi Abdomen: Soft, nontender, nondistended, positive bowel sounds. Extremities: No clubbing cyanosis or edema with positive pedal pulses.     Lab Results: Basic Metabolic Panel:  Basename 03/16/11 0455 03/15/11 0814  NA 141 142  K 3.7 3.9  CL 111 111  CO2 21 23  GLUCOSE 79 77  BUN 3* 4*  CREATININE 0.44* 0.42*  CALCIUM 8.2* 8.7  MG -- --  PHOS -- --   CBC:  Basename 03/16/11 0455 03/14/11 0520 03/13/11 2045  WBC 8.4 17.4* --  NEUTROABS -- -- 6.0  HGB 12.0 13.3 --  HCT 35.7* 40.3 --  MCV 79.9 81.6 --  PLT 187 184 --   D-Dimer:  Basename 03/14/11 0219  DDIMER <0.22   Urinalysis:  Basename 03/14/11 1119  COLORURINE YELLOW  LABSPEC 1.010  PHURINE 7.5  GLUCOSEU NEGATIVE  HGBUR LARGE*  BILIRUBINUR NEGATIVE  KETONESUR NEGATIVE  PROTEINUR 30  UROBILINOGEN 0.2  NITRITE NEGATIVE  LEUKOCYTESUR NEGATIVE    Recent Results (from the past 240 hour(s))  URINE CULTURE     Status: Normal   Collection Time   03/14/11 11:19 AM      Component Value Range Status Comment   Specimen Description URINE, CATHETERIZED   Final    Special Requests NONE   Final    Culture  Setup Time [id number]   Final    Colony Count >=100,000 COLONIES/ML   Final    Culture ESCHERICHIA COLI   Final    Report Status 03/16/2011 FINAL   Final    Organism ID, Bacteria ESCHERICHIA  COLI   Final   MRSA PCR SCREENING     Status: Normal   Collection Time   03/14/11  8:37 PM      Component Value Range Status Comment   MRSA by PCR NEGATIVE  NEGATIVE  Final     Studies/Results: No results found.  Medications: Scheduled Meds:    . baclofen  20 mg Oral TID  . bisacodyl  10 mg Rectal QODAY  . carbamazepine  400 mg Oral TID  . cefTRIAXone (ROCEPHIN)  IV  1 g Intravenous Daily  . enoxaparin  30 mg Subcutaneous Q24H  . feeding supplement  1 Container Oral TID WC  . polyethylene glycol  17 g Oral Daily  . scopolamine  1 patch Transdermal Q72H  . senna  1 tablet Oral BID  . Valproic Acid  500 mg Oral TID  . DISCONTD: enoxaparin  40 mg Subcutaneous Q24H   Continuous Infusions:    . 0.9 % NaCl with KCl 20 mEq / L 75 mL/hr at 03/15/11 1653   PRN Meds:.ibuprofen, LORazepam, morphine injection  Assessment/Plan:  Active Problems:  Acute hypernatremia  Spastic quadriplegia  Seizure  Dysphagia   #1 Hypernatremia: Resolved with IVF. Was definitely hypovolemic on admission and significantly dehydrated. Continue IVF for now.  #2 Leukocytosis:Improving.  #3 E Coli UTI:  On rocephin. Can switch to macrobid/bactrim at time of DC given sensitivities. Would treat for 7 days. Today is day 1/7.  #4 Dispo: Back to group home on Monday.    LOS: 3 days   HERNANDEZ ACOSTA,Demichael Traum [facility] Pager: [phone] 03/16/2011, 1:10 PM

## 2011-03-16 NOTE — Progress Notes (Signed)
Tele box number 2 replaced on patient around 2015 03/16/11. Patient sleeping, resting comfortably. Will continue to watch patient for signs of agitation throughout the night and continue to assess.

## 2011-03-16 NOTE — Progress Notes (Signed)
Pt continues to be very active in bed; observed very agitated. Ativan 1mg  IVP given for decreased agitation.  Telemetry box remains not in place due to pt activity and removal of box.  Dr. Ardyth Harps called regarding telemetry box not in position-awaiting response.  Endorsement given for follow-up. AW

## 2011-03-16 NOTE — Progress Notes (Signed)
ST Cancellation Note 1008 _X__ Treatment cancelled today, SLP spoke to MD and order for swallow evaluation cancelled as pt on Dys1/Honey diet at Group home, CXR negative.   Pt with chronic dysphagia.    MD please reorder if desire.   Thanks!   Signature:  Donavan Burnet, MS [facility] SLP     [phone]

## 2011-03-17 MED ORDER — DEXTROSE 5 % IV SOLN
250.0000 mg | Freq: Three times a day (TID) | INTRAVENOUS | Status: DC
  Administered 2011-03-17 – 2011-03-18 (×5): 250 mg via INTRAVENOUS
  Filled 2011-03-17 (×9): qty 2.5

## 2011-03-17 NOTE — Progress Notes (Signed)
At 0430 Patient has not voided all shift. Bladder scan showed 208cc of urine in bladder. Of note patient has had low intake during shift since she has been asleep. NP on call paged and informed. Telephone order to in and out cath patient x1 received. Patient tolerated procedure very well with an output of 175cc of clear amber urine with some mild white sediment. Patient went back to sleep afterwards. Will continue to monitor patient. Will pass on developments to day shift RN.

## 2011-03-17 NOTE — Progress Notes (Signed)
Can somewhat arouse patient but she will not follow commands. Patient has been asleep all night shift thus far. Deemed unsafe to try to administered PO medications. NP on call informed. Although some meds are not available IV NP is looking into converting over those that can be administered IV. Will continue to monitor patient and assess LOC. Patient is still on telemetry.

## 2011-03-17 NOTE — Progress Notes (Signed)
Subjective: Slept well last night. Had large BM today.  Objective: Vital signs in last 24 hours: Temp:  [98.2 F (36.8 C)-98.8 F (37.1 C)] 98.8 F (37.1 C) (03/03 0526) Pulse Rate:  [78-98] 78  (03/03 0526) Resp:  [14-18] 14  (03/03 0526) BP: (107-135)/(74-77) 107/75 mmHg (03/03 0526) SpO2:  [98 %-99 %] 99 % (03/03 0526) Weight change:  Last BM Date: 03/15/11  Intake/Output from previous day: 03/02 0701 - 03/03 0700 In: 60 [P.O.:60] Out: 175 [Urine:175]     Physical Exam: General: awake, spastic quadriplegia. HEENT: No bruits, no goiter. Heart: Regular rate and rhythm, without murmurs, rubs, gallops. Lungs: Bilateral ronchi Abdomen: Soft, nontender, nondistended, positive bowel sounds. Extremities: No clubbing cyanosis or edema with positive pedal pulses.     Lab Results: Basic Metabolic Panel:  Basename 03/16/11 0455 03/15/11 0814  NA 141 142  K 3.7 3.9  CL 111 111  CO2 21 23  GLUCOSE 79 77  BUN 3* 4*  CREATININE 0.44* 0.42*  CALCIUM 8.2* 8.7  MG -- --  PHOS -- --   CBC:  Basename 03/16/11 0455  WBC 8.4  NEUTROABS --  HGB 12.0  HCT 35.7*  MCV 79.9  PLT 187   Urinalysis:  Basename 03/14/11 1119  COLORURINE YELLOW  LABSPEC 1.010  PHURINE 7.5  GLUCOSEU NEGATIVE  HGBUR LARGE*  BILIRUBINUR NEGATIVE  KETONESUR NEGATIVE  PROTEINUR 30  UROBILINOGEN 0.2  NITRITE NEGATIVE  LEUKOCYTESUR NEGATIVE    Recent Results (from the past 240 hour(s))  URINE CULTURE     Status: Normal   Collection Time   03/14/11 11:19 AM      Component Value Range Status Comment   Specimen Description URINE, CATHETERIZED   Final    Special Requests NONE   Final    Culture  Setup Time [id number]   Final    Colony Count >=100,000 COLONIES/ML   Final    Culture ESCHERICHIA COLI   Final    Report Status 03/16/2011 FINAL   Final    Organism ID, Bacteria ESCHERICHIA COLI   Final   MRSA PCR SCREENING     Status: Normal   Collection Time   03/14/11  8:37 PM   Component Value Range Status Comment   MRSA by PCR NEGATIVE  NEGATIVE  Final     Studies/Results: No results found.  Medications: Scheduled Meds:    . baclofen  20 mg Oral TID  . bisacodyl  10 mg Rectal QODAY  . carbamazepine  400 mg Oral TID  . cefTRIAXone (ROCEPHIN)  IV  1 g Intravenous Daily  . enoxaparin  30 mg Subcutaneous Q24H  . feeding supplement  1 Container Oral TID WC  . polyethylene glycol  17 g Oral Daily  . scopolamine  1 patch Transdermal Q72H  . senna  1 tablet Oral BID  . valproate sodium  250 mg Intravenous Q8H  . DISCONTD: Valproic Acid  500 mg Oral TID   Continuous Infusions:   PRN Meds:.food thickener, ibuprofen, LORazepam, morphine injection, DISCONTD: food thickener  Assessment/Plan:  Active Problems:  Acute hypernatremia  Spastic quadriplegia  Seizure  Dysphagia   #1 Hypernatremia: Resolved with IVF. Was definitely hypovolemic on admission and significantly dehydrated. Continue IVF for now.  #2 Leukocytosis:Improving.  #3 E Coli UTI: On rocephin. Can switch to macrobid/bactrim at time of DC given sensitivities. Would treat for 7 days. Today is day 2/7.  #4 Dispo: Back to group home on Monday.    LOS: 4 days  Chaya Jan [facility] Pager: [phone] 03/17/2011, 9:53 AM

## 2011-03-18 DIAGNOSIS — N390 Urinary tract infection, site not specified: Secondary | ICD-10-CM

## 2011-03-18 DIAGNOSIS — E870 Hyperosmolality and hypernatremia: Secondary | ICD-10-CM | POA: Diagnosis present

## 2011-03-18 MED ORDER — FOOD THICKENER (THICKENUP CLEAR): 1.0000 | ORAL | Status: DC | PRN

## 2011-03-18 MED ORDER — SULFAMETHOXAZOLE-TRIMETHOPRIM 200-40 MG/5ML PO SUSP: 20.0000 mL | Freq: Two times a day (BID) | ORAL | Status: AC

## 2011-03-18 MED ORDER — ENSURE PUDDING PO PUDG: 1.0000 | Freq: Three times a day (TID) | ORAL | Status: DC

## 2011-03-18 MED ORDER — SULFAMETHOXAZOLE-TRIMETHOPRIM 200-40 MG/5ML PO SUSP
20.0000 mL | Freq: Two times a day (BID) | ORAL | Status: DC
  Administered 2011-03-18: 20 mL via ORAL
  Filled 2011-03-18 (×2): qty 20

## 2011-03-18 NOTE — Progress Notes (Signed)
UR complete 

## 2011-03-18 NOTE — Discharge Instructions (Addendum)
Dehydration, Adult Dehydration is when you lose more fluids from the body than you take in. Vital organs like the kidneys, brain, and heart cannot function without a proper amount of fluids and salt. Any loss of fluids from the body can cause dehydration.  CAUSES   Vomiting.   Diarrhea.   Excessive sweating.   Excessive urine output.   Fever.  SYMPTOMS  Mild dehydration  Thirst.   Dry lips.   Slightly dry mouth.  Moderate dehydration  Very dry mouth.   Sunken eyes.   Skin does not bounce back quickly when lightly pinched and released.   Dark urine and decreased urine production.   Decreased tear production.   Headache.  Severe dehydration  Very dry mouth.   Extreme thirst.   Rapid, weak pulse (more than 100 beats per minute at rest).   Cold hands and feet.   Not able to sweat in spite of heat and temperature.   Rapid breathing.   Blue lips.   Confusion and lethargy.   Difficulty being awakened.   Minimal urine production.   No tears.  DIAGNOSIS  Your caregiver will diagnose dehydration based on your symptoms and your exam. Blood and urine tests will help confirm the diagnosis. The diagnostic evaluation should also identify the cause of dehydration. TREATMENT  Treatment of mild or moderate dehydration can often be done at home by increasing the amount of fluids that you drink. It is best to drink small amounts of fluid more often. Drinking too much at one time can make vomiting worse. Refer to the home care instructions below. Severe dehydration needs to be treated at the hospital where you will probably be given intravenous (IV) fluids that contain water and electrolytes. HOME CARE INSTRUCTIONS   Ask your caregiver about specific rehydration instructions.   Drink enough fluids to keep your urine clear or pale yellow.   Drink small amounts frequently if you have nausea and vomiting.   Eat as you normally do.   Avoid:   Foods or drinks high in  sugar.   Carbonated drinks.   Juice.   Extremely hot or cold fluids.   Drinks with caffeine.   Fatty, greasy foods.   Alcohol.   Tobacco.   Overeating.   Gelatin desserts.   Wash your hands well to avoid spreading bacteria and viruses.   Only take over-the-counter or prescription medicines for pain, discomfort, or fever as directed by your caregiver.   Ask your caregiver if you should continue all prescribed and over-the-counter medicines.   Keep all follow-up appointments with your caregiver.  SEEK MEDICAL CARE IF:  You have abdominal pain and it increases or stays in one area (localizes).   You have a rash, stiff neck, or severe headache.   You are irritable, sleepy, or difficult to awaken.   You are weak, dizzy, or extremely thirsty.  SEEK IMMEDIATE MEDICAL CARE IF:   You are unable to keep fluids down or you get worse despite treatment.   You have frequent episodes of vomiting or diarrhea.   You have blood or green matter (bile) in your vomit.   You have blood in your stool or your stool looks Zakaiya Lares and tarry.   You have not urinated in 6 to 8 hours, or you have only urinated a small amount of very dark urine.   You have a fever.   You faint.  MAKE SURE YOU:   Understand these instructions.   Will watch your condition.     Will get help right away if you are not doing well or get worse.  Document Released: 12/31/2004 Document Revised: 12/20/2010 Document Reviewed: 08/20/2010 Exit[employer] Patient Information 2012 [employer]. 

## 2011-03-18 NOTE — Progress Notes (Signed)
Patient set to discharge back to [facility] today. CSW spoke with Bjorn Loser (ph#: [phone]) and faxed discharge summary to facility (fax#: [phone]) and copied chart. Per [facility] Loser, they are sending transport out to pick patient up via wheelchair van.   Unice Bailey, LCSWA [phone]

## 2011-03-18 NOTE — Discharge Summary (Signed)
Agree with DC Summary by Toya Smothers, NP. She was found to have a UTI and Hypernatremia. Will complete a course of bactrim for her UTI. Ready for discharge back to group home today.

## 2011-03-18 NOTE — Discharge Summary (Addendum)
Physician Discharge Summary  Patient ID: Sheila Costa MRN: [id number] DOB/AGE: December 27, 1985 27 y.o.  Admit date: 03/13/2011 Discharge date: 03/18/2011  Primary Care Physician:  Buckner Malta, MD, MD   Discharge Diagnoses:    Active Problems:  Spastic quadriplegia  Seizure  Dysphagia  UTI (lower urinary tract infection)  Hypernatremia    Medication List  As of 03/18/2011 10:51 AM   TAKE these medications         baclofen 20 MG tablet   Commonly known as: LIORESAL   Take 20 mg by mouth 3 (three) times daily.      benzoyl peroxide 10 % gel   Apply 1 application topically 2 (two) times daily.      bisacodyl 10 MG suppository   Commonly known as: DULCOLAX   Place 10 mg rectally every other day.      calcium carbonate 500 MG chewable tablet   Commonly known as: TUMS - dosed in mg elemental calcium   Chew 2 tablets by mouth 2 (two) times daily.      chlorhexidine 0.12 % solution   Commonly known as: PERIDEX   Use as directed 10 mLs in the mouth or throat daily. Use 10ml to rinse or swab mouth daily with brushing.      diazepam 10 MG Gel   Commonly known as: DIASTAT ACUDIAL   Place 10 mg rectally once. For seizure lasting longer that 5 minutes . Call MD if not effective after 10 minutes.      EQUETRO 300 MG Cp12   Generic drug: Carbamazepine (Antipsychotic)   Take 600 mg by mouth 2 (two) times daily. May open capsule. Do not crush.      eucerin cream   Apply 1 application topically as needed. DRY SKIN      feeding supplement Pudg   Take 1 Container by mouth 3 (three) times daily with meals.      FLEET ENEMA RE   Place 1 Units rectally every three (3) days as needed. If no bowel movement.      food thickener Powd   Commonly known as: RESOURCE THICKENUP CLEAR   Take 120 g by mouth as needed.      ibuprofen 600 MG tablet   Commonly known as: ADVIL,MOTRIN   Take 600 mg by mouth every 8 (eight) hours as needed. PAIN      polyethylene glycol packet   Commonly  known as: MIRALAX / GLYCOLAX   Take 17 g by mouth daily.      scopolamine 1.5 MG   Commonly known as: TRANSDERM-SCOP   Place 1 patch onto the skin every 3 (three) days. Behind ear.      senna 8.6 MG Tabs   Commonly known as: SENOKOT   Take 1 tablet by mouth 2 (two) times daily.      sulfamethoxazole-trimethoprim 200-40 MG/5ML suspension   Commonly known as: BACTRIM,SEPTRA   Take 20 mLs by mouth 2 (two) times daily.      triazolam 0.25 MG tablet   Commonly known as: HALCION   Take 0.25 mg by mouth as needed. 1 tablet by mouth prior to dental appointment.   Do not administer until directed.      Valproic Acid 250 MG/5ML Syrp syrup   Commonly known as: DEPAKENE   Take 500 mg by mouth 3 (three) times daily.      Vitamin D (Ergocalciferol) 50000 UNITS Caps   Commonly known as: DRISDOL   Take 50,000 Units by mouth every  7 (seven) days. Open 1 capsule and squeeze contents into pudding, yogurt or applesauce once a week on Monday.             Disposition and Follow-up: Pt is medically stable and ready for discharge to group home. Follow up with PCP in 2 weeks  Consults:  None   Physical exam  General: awake, alert, taking meds,  spastic quadriplegia.  HEENT: No bruits, no goiter. Mucus membranes mouth moist/pink Heart: Regular rate and rhythm, without murmurs, rubs, gallops.  Lungs: Normal effort. Breath sounds with mild bilateral ronchi No wheeze Abdomen: Soft, nontender, nondistended, positive bowel sounds.  Extremities: No clubbing cyanosis or edema with positive pedal pulses.   Significant Diagnostic Studies:  Dg Chest 2 View  03/13/2011  *RADIOLOGY REPORT*  Clinical Data: 26 year old female with chest congestion and cough.  CHEST - 2 VIEW  Comparison: 07/08/2007.  Findings: Semi upright AP and lateral views of the chest.  Chronic posterior spinal hardware.  Low lung volumes.  Cardiac size and mediastinal contours are within normal limits.  The patient's arms are not  raised on the lateral view limiting its utility.  No pneumothorax, pulmonary edema, pleural effusion, or definite airspace opacity.  IMPRESSION: No definite acute cardiopulmonary abnormality.  Original Report Authenticated By: Harley Hallmark, M.D.   Ct Angio Chest W/cm &/or Wo Cm  03/14/2011  *RADIOLOGY REPORT*  Clinical Data: Weakness, cough and crying; tachycardia. Leukocytosis.  CT ANGIOGRAPHY CHEST  Technique:  Multidetector CT imaging of the chest using the standard protocol during bolus administration of intravenous contrast. Multiplanar reconstructed images including MIPs were obtained and reviewed to evaluate the vascular anatomy.  Contrast: 80mL OMNIPAQUE IOHEXOL 300 MG/ML IJ SOLN  Comparison: Chest radiograph performed 03/13/2011  Findings: There is no evidence of significant pulmonary embolus.  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  Minimal surrounding atelectasis is noted.  Minimal left basilar atelectasis is also seen.  There is no evidence of significant focal consolidation, pleural effusion or pneumothorax.  The mediastinum is unremarkable in appearance.  No mediastinal lymphadenopathy is seen.  No pericardial effusion is identified. The great vessels are grossly unremarkable in appearance.  No axillary lymphadenopathy is seen.  The visualized portions of the thyroid gland are unremarkable in appearance.  The visualized portions of the liver and spleen are unremarkable. The course of the esophagus is difficult to fully characterize, but no focal mass is seen.  There is asymmetric prominence of the right sternocleidomastoid muscle; this is thought to be chronic in nature, without a focal mass.  No acute osseous abnormalities are seen.  Thoracolumbar spinal fusion rods are partially imaged.  IMPRESSION:  1.  No evidence of significant pulmonary embolus. 2.  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 3.  Minimal bibasilar atelectasis noted. 4.  Asymmetric prominence of the right sternocleidomastoid muscle is thought to reflect chronic hypertrophy, without a focal mass.  Original Report Authenticated By: Tonia Ghent, M.D.    Labs Reviewed  CBC - Abnormal; Notable for the following:    WBC 11.4 (*)    RBC 5.46 (*)    All other components within normal limits  DIFFERENTIAL - Abnormal; Notable for the following:    Monocytes Relative 23 (*)    Monocytes Absolute 2.6 (*)    All other components within normal limits  BASIC  METABOLIC PANEL - Abnormal; Notable for the following:    Sodium 151 (*)    All other components within normal limits  URINALYSIS, ROUTINE W REFLEX MICROSCOPIC - Abnormal; Notable for the following:    APPearance TURBID (*)    Hgb urine dipstick LARGE (*)    All other components within normal limits  VALPROIC ACID LEVEL - Abnormal; Notable for the following:    Valproic Acid Lvl 48.6 (*)    All other components within normal limits  BASIC METABOLIC PANEL - Abnormal; Notable for the following:    Sodium 150 (*)    Chloride 116 (*)    Calcium 8.3 (*)    All other components within normal limits  CBC - Abnormal; Notable for the following:    WBC 17.4 (*)    All other components within normal limits  URINE MICROSCOPIC-ADD ON - Abnormal; Notable for the following:    Bacteria, UA MANY (*)    All other components within normal limits  BASIC METABOLIC PANEL - Abnormal; Notable for the following:    BUN 4 (*)    Creatinine, Ser 0.42 (*)    All other components within normal limits  BASIC METABOLIC PANEL - Abnormal; Notable for the following:    BUN 3 (*)    Creatinine, Ser 0.44 (*)    Calcium 8.2 (*)    All other components within normal limits  CBC - Abnormal; Notable for the following:    HCT 35.7 (*)    All other components within normal limits    LACTIC ACID, PLASMA  URINE CULTURE  D-DIMER, QUANTITATIVE  MRSA PCR SCREENING  VALPROIC ACID LEVEL        Dg Chest 2 View  03/13/2011  *RADIOLOGY REPORT*  Clinical Data: 26 year old female with chest congestion and cough.  CHEST - 2 VIEW  Comparison: 07/08/2007.  Findings: Semi upright AP and lateral views of the chest.  Chronic posterior spinal hardware.  Low lung volumes.  Cardiac size and mediastinal contours are within normal limits.  The patient's arms are not raised on the lateral view limiting its utility.  No pneumothorax, pulmonary edema, pleural effusion, or definite airspace opacity.  IMPRESSION: No definite acute cardiopulmonary abnormality.  Original Report Authenticated By: Harley Hallmark, M.D.   Ct Angio Chest W/cm &/or Wo Cm  03/14/2011  *RADIOLOGY REPORT*  Clinical Data: Weakness, cough and crying; tachycardia. Leukocytosis.  CT ANGIOGRAPHY CHEST  Technique:  Multidetector CT imaging of the chest using the standard protocol during bolus administration of intravenous contrast. Multiplanar reconstructed images including MIPs were obtained and reviewed to evaluate the vascular anatomy.  Contrast: 80mL OMNIPAQUE IOHEXOL 300 MG/ML IJ SOLN  Comparison: Chest radiograph performed 03/13/2011  Findings: There is no evidence of significant pulmonary embolus.  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  Minimal surrounding atelectasis is noted.  Minimal left basilar atelectasis is also seen.  There is no evidence of significant focal consolidation, pleural effusion or pneumothorax.  The mediastinum is unremarkable in appearance.  No mediastinal lymphadenopathy is seen.  No pericardial effusion is identified. The great vessels are grossly unremarkable in appearance.  No axillary lymphadenopathy is seen.  The visualized portions of the thyroid gland are unremarkable in appearance.  The visualized portions of the liver and spleen are  unremarkable. The course of the esophagus is difficult to fully characterize, but no focal mass is seen.  There is asymmetric prominence of the right sternocleidomastoid  muscle; this is thought to be chronic in nature, without a focal mass.  No acute osseous abnormalities are seen.  Thoracolumbar spinal fusion rods are partially imaged.  IMPRESSION:  1.  No evidence of significant pulmonary embolus. 2.  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 3.  Minimal bibasilar atelectasis noted. 4.  Asymmetric prominence of the right sternocleidomastoid muscle is thought to reflect chronic hypertrophy, without a focal mass.  Original Report Authenticated By: Tonia Ghent, M.D.       Brief H and P: For complete details please refer to admission H and P, but in brief   This is a 26 year old female, she has some mental retardation and cerebral palsy. She is a resident of [facility] group home, completely nonverbal presented to [facility] ED 03/14/11 with cc cough and decreased level of responsiveness.  Facility reports that she is usually awake and happy. Over the past few days, she's become listless and coughing according to facility records. She's not been eating, she cried all weekend. She's been scratching herself and biting herself. She was brought to the ER for evaluation. In the ER the patient was dehydrated with a sodium of 151.  In the ER the patient was persistently tachycardic even  after 2 L of fluid resuscitation. The hospitalist service was called for evaluation. Patient has also presented with a  Course sounding cough. She was admitted to tele for further eval/treatment   Hospital Course:   Active Problems:  Spastic quadriplegia  Seizure  Dysphagia  #1 Hypernatremia: Patient was  definitely hypovolemic on admission and significantly dehydrated.She was  supported with IV fluids and responded positively. By 03/16/11 sodium 141.   #2 Leukocytosis: Source initially unclear.  Chest xray and CT chest and u/a as above. Pt found to have ecoli UTI and rocephin iv started. At time of discharge pt receiving Bactrim po until 03/22/11 for total of 7 day treatment.   #3 E Coli UTI: On rocephin then po bactrim. Will  treat for 7 days. Last day is 03/22/11.  Time spent on Discharge: 35 min  Signed: Gwenyth Bender 03/18/2011, 10:51 AM

## 2011-05-22 ENCOUNTER — Telehealth: Payer: Self-pay | Admitting: *Deleted

## 2011-05-22 NOTE — Telephone Encounter (Signed)
Joyce [facility] from [facility] called regarding if pt should be seen again. Pt never has pap done, last OV 2008. Please see paper chart.

## 2011-05-27 NOTE — Telephone Encounter (Signed)
I'm not sure that a Pap smear needs to be done but I do think that she needs to be seen and have a general exam.

## 2011-05-27 NOTE — Telephone Encounter (Signed)
Spoke with kay regarding the below note, she will make OV for pt.

## 2011-08-07 ENCOUNTER — Encounter: Payer: Self-pay | Admitting: Gynecology

## 2011-08-13 ENCOUNTER — Ambulatory Visit (INDEPENDENT_AMBULATORY_CARE_PROVIDER_SITE_OTHER): Payer: Medicare Other | Admitting: Gynecology

## 2011-08-13 ENCOUNTER — Encounter: Payer: Self-pay | Admitting: Gynecology

## 2011-08-13 VITALS — Ht 64.0 in | Wt 91.0 lb

## 2011-08-13 DIAGNOSIS — N926 Irregular menstruation, unspecified: Secondary | ICD-10-CM

## 2011-08-13 DIAGNOSIS — G8250 Quadriplegia, unspecified: Secondary | ICD-10-CM

## 2011-08-13 DIAGNOSIS — F79 Unspecified intellectual disabilities: Secondary | ICD-10-CM

## 2011-08-13 NOTE — Patient Instructions (Signed)
Per dictated note

## 2011-08-13 NOTE — Progress Notes (Signed)
26 year old G0 referral from Dr. Ronnie Derby at the [facility] care center with a history of spastic quadriparesis, seizure disorder, encephalopathy, scoliosis and profound mental retardation. Patient is having irregular menses as she always has had and on menstrual calendar has been basically having a period approximately 4 times a year. She was evaluated several years ago for similar complaints and had normal TSH FSH prolactin levels and an ultrasound which showed an endometrial echo of 2.6 mm. I had discussed the issues of endometrial hyperplasia protection and the options to include expectant, follow with ultrasounds, intermittent progesterone withdrawals, Depo-Provera. She has not done any of these since then in 2008. She's otherwise without complaints from her caregivers.  Exam with Sherrilyn Rist and attendant caregivers assistance Gen. Appearance profound mental retardation with spastic quadriplegia HEENT with. Oral crusting areas suspicious for impetigo Chest exam is normal Breasts without masses retractions discharge adenopathy Abdomen soft nontender without masses guarding rebound or organomegaly Pelvic external normal, digital with virginal status limiting exam grossly normal. Rectal exam normal.  Assessment and plan: 26 year old with above issues, virginal with irregular menses, lifelong. Evaluation 2008 showed normal hormone levels and negative ultrasound to include an endometrial echo of 2.6 mm. Her bleeding pattern has not changed since that evaluation.  Discussed with attendants6 issues of irregular menses. Suspect secondary to primary mental state versus possible medication side effect. On prior evaluation appeared to be somewhat hypoestrogenic with thin endometrium. Options now would be observation, intermittent progesterone withdrawal, Depo-Provera, ultrasound surveillance. My recommendation would be intermittent progesterone withdrawal if without spontaneous menses every 3 months with  medroxyprogesterone 10 mg x15 days to assess for withdrawal bleed. This will allow for endometrial shedding and prevention of hyperplasia. My concern on ultrasound surveillance would be the return of a questionable scan that would then raise the discussion for sampling which would require anesthesia and the risk versus benefit issues given her current situation. I think as long as she has a predictable withdrawal bleed or no bleeding with the progesterone challenge every 3 months  I would continue to follow expectantly. No Pap smear done, patient virginal and given the circumstances would require general anesthesia. I did discuss the perioral changes suggestive of impetigo and my suggestion for antibiotic cream and they're going to discuss this with the nurse at the facility.

## 2011-08-28 ENCOUNTER — Encounter: Payer: Self-pay | Admitting: Gynecology

## 2012-06-21 ENCOUNTER — Emergency Department (HOSPITAL_COMMUNITY): Payer: Medicare Other

## 2012-06-21 ENCOUNTER — Emergency Department (HOSPITAL_COMMUNITY)
Admission: EM | Admit: 2012-06-21 | Discharge: 2012-06-22 | Disposition: A | Discharge status: Home / Self Care | Payer: Medicare Other | Attending: Emergency Medicine | Admitting: Emergency Medicine

## 2012-06-21 ENCOUNTER — Encounter (HOSPITAL_COMMUNITY): Payer: Self-pay | Admitting: *Deleted

## 2012-06-21 DIAGNOSIS — E872 Acidosis, unspecified: Secondary | ICD-10-CM | POA: Insufficient documentation

## 2012-06-21 DIAGNOSIS — K5900 Constipation, unspecified: Secondary | ICD-10-CM | POA: Insufficient documentation

## 2012-06-21 DIAGNOSIS — Z8669 Personal history of other diseases of the nervous system and sense organs: Secondary | ICD-10-CM | POA: Insufficient documentation

## 2012-06-21 DIAGNOSIS — Z3202 Encounter for pregnancy test, result negative: Secondary | ICD-10-CM | POA: Insufficient documentation

## 2012-06-21 DIAGNOSIS — Z79899 Other long term (current) drug therapy: Secondary | ICD-10-CM | POA: Insufficient documentation

## 2012-06-21 DIAGNOSIS — R112 Nausea with vomiting, unspecified: Secondary | ICD-10-CM | POA: Insufficient documentation

## 2012-06-21 DIAGNOSIS — Z8739 Personal history of other diseases of the musculoskeletal system and connective tissue: Secondary | ICD-10-CM | POA: Insufficient documentation

## 2012-06-21 DIAGNOSIS — F79 Unspecified intellectual disabilities: Secondary | ICD-10-CM | POA: Insufficient documentation

## 2012-06-21 HISTORY — DX: Constipation, unspecified: K59.00

## 2012-06-21 LAB — CBC WITH DIFFERENTIAL/PLATELET
Eosinophils Absolute: 0 10*3/uL (ref 0.0–0.7)
Eosinophils Relative: 0 % (ref 0–5)
HCT: 37.5 % (ref 36.0–46.0)
Lymphocytes Relative: 22 % (ref 12–46)
Lymphs Abs: 1.1 10*3/uL (ref 0.7–4.0)
MCH: 25.4 pg — ABNORMAL LOW (ref 26.0–34.0)
MCV: 77.5 fL — ABNORMAL LOW (ref 78.0–100.0)
Monocytes Absolute: 0.5 10*3/uL (ref 0.1–1.0)
Platelets: 218 10*3/uL (ref 150–400)
RBC: 4.84 MIL/uL (ref 3.87–5.11)

## 2012-06-21 LAB — COMPREHENSIVE METABOLIC PANEL
ALT: 11 U/L (ref 0–35)
BUN: 10 mg/dL (ref 6–23)
CO2: 25 mEq/L (ref 19–32)
Calcium: 9.6 mg/dL (ref 8.4–10.5)
Creatinine, Ser: 0.33 mg/dL — ABNORMAL LOW (ref 0.50–1.10)
GFR calc Af Amer: >90 mL/min (ref >90)
GFR calc non Af Amer: >90 mL/min (ref >90)
Glucose, Bld: 126 mg/dL — ABNORMAL HIGH (ref 70–99)
Sodium: 138 mEq/L (ref 135–145)
Total Protein: 7.5 g/dL (ref 6.0–8.3)

## 2012-06-21 LAB — URINALYSIS, ROUTINE W REFLEX MICROSCOPIC
Bilirubin Urine: NEGATIVE
Leukocytes, UA: NEGATIVE
Nitrite: NEGATIVE
Specific Gravity, Urine: 1.029 (ref 1.005–1.030)
Urobilinogen, UA: 0.2 mg/dL (ref 0.0–1.0)
pH: 6.5 (ref 5.0–8.0)

## 2012-06-21 MED ORDER — SODIUM CHLORIDE 0.9 % IV BOLUS (SEPSIS): 1000.0000 mL | Freq: Once | INTRAVENOUS | Status: DC

## 2012-06-21 MED ORDER — SODIUM CHLORIDE 0.9 % IV BOLUS (SEPSIS)
1000.0000 mL | Freq: Once | INTRAVENOUS | Status: AC
  Administered 2012-06-21: 1000 mL via INTRAVENOUS

## 2012-06-21 NOTE — ED Notes (Signed)
Pt is a quadriplegic who lives in a group home. Pt's care giver has stated that she has not eaten since breakfast and that all she has had was liquid nourishment.  Pt began vomiting after the dinner hour,  Nurse at the facility palpated her stomach which was tender.  Facility doctor was advised and told the Nurse to send her to the ED due to the potential for aspiration.

## 2012-06-21 NOTE — ED Notes (Signed)
Pt from [facility], staff reports pt has not been eating and having N/V. Pt has MR and is non verbal

## 2012-06-22 MED ORDER — ONDANSETRON 4 MG PO TBDP: 4.0000 mg | ORAL_TABLET | Freq: Three times a day (TID) | ORAL | Status: DC | PRN

## 2012-06-22 NOTE — ED Provider Notes (Signed)
History     CSN: [id number]  Arrival date & time 06/21/12  1956   First MD Initiated Contact with Patient 06/21/12 2124      Chief Complaint  Patient presents with  . Nausea  . Emesis    (Consider location/radiation/quality/duration/timing/severity/associated sxs/prior treatment) HPI Patient presents from a group home with a caregiver.  There is a limited history of present illness secondary to the patient's multiple medical problems, including MR, CP, nonverbal status. Level V caveat The caregiver states that the past day the patient has had nausea with vomiting, with decreased appetite.  No reports of fever, behavioral changes, diarrhea.  Past Medical History  Diagnosis Date  . Mental retardation   . Spastic quadriparesis   . Seizures   . Scoliosis   . Encephalopathy   . Dysphagia   . Pressure ulcer 2008    right lower extremity  . Constipation     Past Surgical History  Procedure Laterality Date  . Hardware in back    . Back surgery      No family history on file.  History  Substance Use Topics  . Smoking status: Never Smoker   . Smokeless tobacco: Never Used  . Alcohol Use: No    OB History   Grav Para Term Preterm Abortions TAB SAB Ect Mult Living   0               Review of Systems  Unable to perform ROS: Patient nonverbal    Allergies  Review of patient's allergies indicates no known allergies.  Home Medications   Current Outpatient Rx  Name  Route  Sig  Dispense  Refill  . baclofen (LIORESAL) 20 MG tablet   Oral   Take 20 mg by mouth 3 (three) times daily.         . benzoyl peroxide 10 % gel   Topical   Apply 1 application topically 2 (two) times daily.         . calcium carbonate (TUMS - DOSED IN MG ELEMENTAL CALCIUM) 500 MG chewable tablet   Oral   Chew 2 tablets by mouth daily.          . Carbamazepine, Antipsychotic, (EQUETRO) 300 MG CP12   Oral   Take 600 mg by mouth 2 (two) times daily. May open capsule. Do not  crush.         . chlorhexidine (PERIDEX) 0.12 % solution   Mouth/Throat   Use as directed 10 mLs in the mouth or throat daily. Use 10ml to rinse or swab mouth daily with brushing.         . cholecalciferol (VITAMIN D) 1000 UNITS tablet   Oral   Take 2,000 Units by mouth daily.         . feeding supplement (RESOURCE BREEZE) LIQD   Oral   Take 120 mLs by mouth 3 (three) times daily between meals.         . polyethylene glycol (MIRALAX / GLYCOLAX) packet   Oral   Take 17 g by mouth daily.         Marland Kitchen scopolamine (TRANSDERM-SCOP) 1.5 MG   Transdermal   Place 1 patch onto the skin every 3 (three) days. Behind ear.         . senna (SENOKOT) 8.6 MG TABS   Oral   Take 1 tablet by mouth 2 (two) times daily.          . Skin Protectants, Misc. (EUCERIN) cream  Topical   Apply 1 application topically as needed. DRY SKIN         . Valproic Acid (DEPAKENE) 250 MG/5ML SYRP syrup   Oral   Take 500 mg by mouth 3 (three) times daily.         . ondansetron (ZOFRAN ODT) 4 MG disintegrating tablet   Oral   Take 1 tablet (4 mg total) by mouth every 8 (eight) hours as needed for nausea.   10 tablet   0   . Sodium Phosphates (FLEET ENEMA RE)   Rectal   Place 1 Units rectally every three (3) days as needed. If no bowel movement.         . triazolam (HALCION) 0.25 MG tablet   Oral   Take 0.25 mg by mouth as needed. 1 tablet by mouth prior to dental appointment.   Do not administer until directed.           BP 113/94  Pulse 86  Resp 22  SpO2 98%  Physical Exam  Nursing note and vitals reviewed. Constitutional:   Young female with contracture consistent with CP, nonverbal, seemingly comfortable  HENT:  Head: Normocephalic and atraumatic.  Eyes: Conjunctivae are normal. Pupils are equal, round, and reactive to light. Right eye exhibits no discharge. Left eye exhibits no discharge.  Neck: No tracheal deviation present.  Cardiovascular: Normal rate and regular  rhythm.   No murmur heard. Pulmonary/Chest: Effort normal. No stridor. No respiratory distress.  Abdominal: Soft. Normal appearance. There is no hepatosplenomegaly. There is generalized tenderness. There is no rigidity, no rebound and no guarding.  Musculoskeletal:  No gross deformities  Neurological:  Patient is awake, but noninteractive, nonverbal.  She moves no extremities significantly, but does move spontaneously, minimally throughout.    ED Course  Procedures (including critical care time)  Labs Reviewed  CBC WITH DIFFERENTIAL - Abnormal; Notable for the following:    MCV 77.5 (*)    MCH 25.4 (*)    All other components within normal limits  COMPREHENSIVE METABOLIC PANEL - Abnormal; Notable for the following:    Glucose, Bld 126 (*)    Creatinine, Ser 0.33 (*)    Albumin 3.4 (*)    Alkaline Phosphatase 36 (*)    Total Bilirubin 0.1 (*)    All other components within normal limits  URINALYSIS, ROUTINE W REFLEX MICROSCOPIC - Abnormal; Notable for the following:    Color, Urine AMBER (*)    All other components within normal limits  CG4 I-STAT (LACTIC ACID) - Abnormal; Notable for the following:    Lactic Acid, Venous 3.52 (*)    All other components within normal limits  POCT PREGNANCY, URINE   Dg Abd Acute W/chest  06/21/2012   *RADIOLOGY REPORT*  Clinical Data: Abdominal pain, nausea, and vomiting.  Quadriplegic patient.  ACUTE ABDOMEN SERIES (ABDOMEN 2 VIEW & CHEST 1 VIEW)  Comparison: Chest 03/13/2011  Findings: Shallow inspiration.  Normal heart size and pulmonary vascularity.  Mild increased density in the lung bases likely due to atelectasis.  No focal consolidation.  No blunting of costophrenic angles.  No pneumothorax.  Posterior rod, screw, and cerclage wire fixation of the thoracolumbar sacral spine and pelvis.  Stool filled colon with prominent stool in the rectum.  No small or large bowel distension.  No free intra-abdominal air.  No radiopaque stones.  IMPRESSION:  Shallow inspiration with atelectasis in the lung bases.  Stool filled colon.  No evidence of obstruction.   Original Report Authenticated  By: Burman Nieves, M.D.     1. Nausea and vomiting   2. Lactic acidosis     Update: On repeat exam the patient appears calm, comfortable.  Caregiver states the patient appears better.  MDM  This young female returns from her group home with concerns of nausea and vomiting with anorexia.  On exam she is afebrile, seemingly in no distress, though there is mild tenderness to palpation about her abdomen.  She is not peritoneal, and her labs are largely reassuring aside from mild lactic acidosis.  This is consistent with dehydration, and given the absence of fever or leukocytosis, there is low suspicion for acute intra-abdominal pathology.  The patient's presentation may be due to viral illness, though there is likely some dehydration as well.  She received IV fluids, and with the caregivers endorsement of an improvement, she is discharged in stable condition with explicit return precautions, follow up instructions.       Gerhard Munch, MD 06/22/12 [phone]

## 2013-05-29 ENCOUNTER — Emergency Department (HOSPITAL_COMMUNITY)
Admission: EM | Admit: 2013-05-29 | Discharge: 2013-05-29 | Disposition: A | Discharge status: Home / Self Care | Payer: Medicare Other | Attending: Emergency Medicine | Admitting: Emergency Medicine

## 2013-05-29 ENCOUNTER — Encounter (HOSPITAL_COMMUNITY): Payer: Self-pay | Admitting: Emergency Medicine

## 2013-05-29 DIAGNOSIS — Z8719 Personal history of other diseases of the digestive system: Secondary | ICD-10-CM | POA: Insufficient documentation

## 2013-05-29 DIAGNOSIS — Z8739 Personal history of other diseases of the musculoskeletal system and connective tissue: Secondary | ICD-10-CM | POA: Insufficient documentation

## 2013-05-29 DIAGNOSIS — G40909 Epilepsy, unspecified, not intractable, without status epilepticus: Secondary | ICD-10-CM | POA: Insufficient documentation

## 2013-05-29 DIAGNOSIS — Z872 Personal history of diseases of the skin and subcutaneous tissue: Secondary | ICD-10-CM | POA: Insufficient documentation

## 2013-05-29 DIAGNOSIS — R630 Anorexia: Secondary | ICD-10-CM | POA: Insufficient documentation

## 2013-05-29 DIAGNOSIS — Z3202 Encounter for pregnancy test, result negative: Secondary | ICD-10-CM | POA: Insufficient documentation

## 2013-05-29 DIAGNOSIS — Z79899 Other long term (current) drug therapy: Secondary | ICD-10-CM | POA: Insufficient documentation

## 2013-05-29 DIAGNOSIS — N390 Urinary tract infection, site not specified: Secondary | ICD-10-CM | POA: Insufficient documentation

## 2013-05-29 DIAGNOSIS — Z8659 Personal history of other mental and behavioral disorders: Secondary | ICD-10-CM | POA: Insufficient documentation

## 2013-05-29 LAB — COMPREHENSIVE METABOLIC PANEL
ALK PHOS: 46 U/L (ref 39–117)
ALT: 7 U/L (ref 0–35)
AST: 14 U/L (ref 0–37)
Albumin: 4 g/dL (ref 3.5–5.2)
BUN: 10 mg/dL (ref 6–23)
CO2: 25 meq/L (ref 19–32)
Calcium: 9.8 mg/dL (ref 8.4–10.5)
Chloride: 103 mEq/L (ref 96–112)
Creatinine, Ser: 0.45 mg/dL — ABNORMAL LOW (ref 0.50–1.10)
GLUCOSE: 96 mg/dL (ref 70–99)
POTASSIUM: 4 meq/L (ref 3.7–5.3)
SODIUM: 143 meq/L (ref 137–147)
Total Bilirubin: 0.2 mg/dL — ABNORMAL LOW (ref 0.3–1.2)
Total Protein: 7.9 g/dL (ref 6.0–8.3)

## 2013-05-29 LAB — LACTIC ACID, PLASMA: Lactic Acid, Venous: 1.4 mmol/L (ref 0.5–2.2)

## 2013-05-29 LAB — CBC WITH DIFFERENTIAL/PLATELET
Basophils Absolute: 0 10*3/uL (ref 0.0–0.1)
Basophils Relative: 0 % (ref 0–1)
Eosinophils Absolute: 0 10*3/uL (ref 0.0–0.7)
Eosinophils Relative: 0 % (ref 0–5)
HCT: 41 % (ref 36.0–46.0)
Hemoglobin: 13.7 g/dL (ref 12.0–15.0)
LYMPHS ABS: 2.9 10*3/uL (ref 0.7–4.0)
LYMPHS PCT: 46 % (ref 12–46)
MCH: 25.6 pg — ABNORMAL LOW (ref 26.0–34.0)
MCHC: 33.4 g/dL (ref 30.0–36.0)
MCV: 76.5 fL — ABNORMAL LOW (ref 78.0–100.0)
Monocytes Absolute: 1.2 10*3/uL — ABNORMAL HIGH (ref 0.1–1.0)
Monocytes Relative: 20 % — ABNORMAL HIGH (ref 3–12)
NEUTROS ABS: 2.2 10*3/uL (ref 1.7–7.7)
NEUTROS PCT: 34 % — AB (ref 43–77)
PLATELETS: 215 10*3/uL (ref 150–400)
RBC: 5.36 MIL/uL — AB (ref 3.87–5.11)
RDW: 13.6 % (ref 11.5–15.5)
WBC: 6.3 10*3/uL (ref 4.0–10.5)

## 2013-05-29 LAB — URINE MICROSCOPIC-ADD ON

## 2013-05-29 LAB — URINALYSIS, ROUTINE W REFLEX MICROSCOPIC
BILIRUBIN URINE: NEGATIVE
Glucose, UA: NEGATIVE mg/dL
KETONES UR: 15 mg/dL — AB
NITRITE: NEGATIVE
PH: 6.5 (ref 5.0–8.0)
Protein, ur: >300 mg/dL — AB
Specific Gravity, Urine: 1.01 (ref 1.005–1.030)
UROBILINOGEN UA: 1 mg/dL (ref 0.0–1.0)

## 2013-05-29 LAB — LIPASE, BLOOD: Lipase: 20 U/L (ref 11–59)

## 2013-05-29 MED ORDER — SODIUM CHLORIDE 0.9 % IV BOLUS (SEPSIS)
1000.0000 mL | Freq: Once | INTRAVENOUS | Status: AC
  Administered 2013-05-29: 1000 mL via INTRAVENOUS

## 2013-05-29 MED ORDER — CIPROFLOXACIN HCL 500 MG PO TABS: 500.0000 mg | ORAL_TABLET | Freq: Two times a day (BID) | ORAL | Status: DC

## 2013-05-29 MED ORDER — DEXTROSE 5 % IV SOLN
1.0000 g | Freq: Once | INTRAVENOUS | Status: AC
  Administered 2013-05-29: 1 g via INTRAVENOUS
  Filled 2013-05-29: qty 10

## 2013-05-29 NOTE — ED Notes (Signed)
Initial contact-pt nonverbal but caregiver at bedside. VSS. Confirmed that patient hasn't been eating or drinking like normal. Caregiver says "she has been acting different with limited eye gazing and she is making different sounds than normal." no other complaints at this time.

## 2013-05-29 NOTE — ED Notes (Signed)
Attempted in and out cath x2. Thin, white liquid returned. Unsuccessful attempts. Will re-attempt shortly.

## 2013-05-29 NOTE — ED Notes (Signed)
Inconclusive result for POC urine due to sample. Jeraldine L[place]sLockwood EDP made aware.

## 2013-05-29 NOTE — ED Notes (Signed)
Pt from [facility] center.  Pt mentally/physically handicapped.  Caregiver states that she has not been eating or drinking since yesterday.  States that she easily gets low sodium so they wanted to make sure that her electrolytes were ok.

## 2013-05-29 NOTE — ED Notes (Signed)
Bed: WA03 Expected date:  Expected time:  Means of arrival:  Comments: EMS sore throat

## 2013-05-29 NOTE — ED Provider Notes (Signed)
CSN: [id number]     Arrival date & time 05/29/13  1636 History   First MD Initiated Contact with Patient 05/29/13 1645     Chief Complaint  Patient presents with  . Failure To Thrive      HPI  LEVEL V CAVEAT  This patient with severe mental retardation, encephalopathy presents from her care facility with her caregiver to 2 concerns of anorexia, possible dehydration.  The patient is noncommunicative, history of present illness is according to the caregiver who has been with the patient only today. Today the patient has been less willing to eat, though she tolerates all foods and medications No report of new vomiting, diarrhea, fever, chills. Patient does not seem to be complaining of pain.   Past Medical History  Diagnosis Date  . Mental retardation   . Spastic quadriparesis   . Seizures   . Scoliosis   . Encephalopathy   . Dysphagia   . Pressure ulcer 2008    right lower extremity  . Constipation    Past Surgical History  Procedure Laterality Date  . Hardware in back    . Back surgery     No family history on file. History  Substance Use Topics  . Smoking status: Never Smoker   . Smokeless tobacco: Never Used  . Alcohol Use: No   OB History   Grav Para Term Preterm Abortions TAB SAB Ect Mult Living   0              Review of Systems  Unable to perform ROS: Patient nonverbal      Allergies  Review of patient's allergies indicates no known allergies.  Home Medications   Prior to Admission medications   Medication Sig Start Date End Date Taking? Authorizing Provider  baclofen (LIORESAL) 20 MG tablet Take 20 mg by mouth 3 (three) times daily.   Yes Historical Provider, MD  calcium carbonate (TUMS - DOSED IN MG ELEMENTAL CALCIUM) 500 MG chewable tablet Chew 1 tablet by mouth daily.    Yes Historical Provider, MD  Carbamazepine, Antipsychotic, (EQUETRO) 300 MG CP12 Take 600 mg by mouth 2 (two) times daily. May open capsule. Do not crush.   Yes Historical  Provider, MD  cholecalciferol (VITAMIN D) 1000 UNITS tablet Take 2,000 Units by mouth daily.   Yes Historical Provider, MD  ibuprofen (ADVIL,MOTRIN) 600 MG tablet Take 600 mg by mouth every 8 (eight) hours as needed for mild pain.   Yes Historical Provider, MD  magnesium oxide (MAG-OX) 400 MG tablet Take 400 mg by mouth 3 (three) times daily.   Yes Historical Provider, MD  ondansetron (ZOFRAN ODT) 4 MG disintegrating tablet Take 1 tablet (4 mg total) by mouth every 8 (eight) hours as needed for nausea. 06/22/12  Yes Gerhard Munchobert Donell Sliwinski, MD  polyethylene glycol [facility](MIRALAX / GLYCOLAX) packet Take 17 g by mouth 2 (two) times daily. Mix with prune juice.   Yes Historical Provider, MD  Probiotic Product (RISA-BID PROBIOTIC PO) Take 1 capsule by mouth 2 (two) times daily.   Yes Historical Provider, MD  scopolamine (TRANSDERM-SCOP) 1.5 MG Place 1 patch onto the skin every 3 (three) days. Behind ear.   Yes Historical Provider, MD  senna (SENOKOT) 8.6 MG TABS Take 1 tablet by mouth 2 (two) times daily.    Yes Historical Provider, MD  Skin Protectants, Misc. (LIP BALM EX) Apply 1 application topically 3 (three) times daily as needed (chapped lips.).   Yes Historical Provider, MD  Sodium Phosphates (FLEET  ENEMA RE) Place 1 Units rectally every three (3) days as needed. If no bowel movement.   Yes Historical Provider, MD  sorbitol 70 % solution Take 10 mLs by mouth every evening.   Yes Historical Provider, MD  Valproic Acid (DEPAKENE) 250 MG/5ML SYRP syrup Take 500 mg by mouth 3 (three) times daily.   Yes Historical Provider, MD   BP 125/102  Pulse 120  Temp(Src) 98.1 F (36.7 C) (Oral)  Resp 16  SpO2 100% Physical Exam  Nursing note and vitals reviewed. Constitutional: She appears ill. No distress.  Frail young F w bilateral contractions  HENT:  Head: Normocephalic and atraumatic.  Eyes: Pupils are equal, round, and reactive to light. Right eye exhibits no discharge. Left eye exhibits no discharge.  Does not  track, PERRL  Neck: No thyromegaly present.  Cardiovascular: Normal rate and intact distal pulses.   No murmur heard. Pulmonary/Chest: Effort normal. No respiratory distress.  Abdominal: She exhibits no distension.  Neurological:   Moves all be spontaneously, though she has bilateral contractions, noted to be at baseline. Patient is essentially nonverbal, though she occasionally groans. No facial asymmetry.   Skin: Skin is warm and dry.  Psychiatric: Cognition and memory are impaired. She is noncommunicative.    ED Course  Procedures (including critical care time) Labs Review Labs Reviewed  CBC WITH DIFFERENTIAL - Abnormal; Notable for the following:    RBC 5.36 (*)    MCV 76.5 (*)    MCH 25.6 (*)    Neutrophils Relative % 34 (*)    Monocytes Relative 20 (*)    Monocytes Absolute 1.2 (*)    All other components within normal limits  COMPREHENSIVE METABOLIC PANEL - Abnormal; Notable for the following:    Creatinine, Ser 0.45 (*)    Total Bilirubin 0.2 (*)    All other components within normal limits  URINALYSIS, ROUTINE W REFLEX MICROSCOPIC - Abnormal; Notable for the following:    Color, Urine STRAW (*)    APPearance TURBID (*)    Hgb urine dipstick LARGE (*)    Ketones, ur 15 (*)    Protein, ur >300 (*)    Leukocytes, UA LARGE (*)    All other components within normal limits  URINE MICROSCOPIC-ADD ON - Abnormal; Notable for the following:    Squamous Epithelial / LPF MANY (*)    Bacteria, UA MANY (*)    All other components within normal limits  LIPASE, BLOOD  LACTIC ACID, PLASMA  POC URINE PREG, ED     9:24 PM Operative exam the patient appears in a similar condition, no distress, no discomfort. I discussed the patient's findings of urinary tract infection with the caregiver.  Patient will initiate antibiotics here, be discharged to her facility.   MDM   This patient with multiple medical problems, including severe retardation presents from her facility with  concerns of anorexia, change in behavior.  Patient is afebrile, awake, alert, in no distress.  Patient's evaluation here demonstrates urinary tract infection.  Patient was started on antibiotics, and discharged back to her monitored facility for further care.    Gerhard Munchobert Fareeha Evon, MD 05/29/13 2126

## 2014-01-17 ENCOUNTER — Encounter (HOSPITAL_COMMUNITY): Payer: Self-pay | Admitting: Emergency Medicine

## 2014-01-17 ENCOUNTER — Inpatient Hospital Stay (HOSPITAL_COMMUNITY)
Admission: EM | Admit: 2014-01-17 | Discharge: 2014-01-21 | DRG: 391 | Payer: Medicare Other | Attending: Internal Medicine | Admitting: Internal Medicine

## 2014-01-17 ENCOUNTER — Emergency Department (HOSPITAL_COMMUNITY): Payer: Medicare Other

## 2014-01-17 DIAGNOSIS — G8250 Quadriplegia, unspecified: Secondary | ICD-10-CM | POA: Diagnosis present

## 2014-01-17 DIAGNOSIS — E872 Acidosis: Secondary | ICD-10-CM | POA: Diagnosis present

## 2014-01-17 DIAGNOSIS — K529 Noninfective gastroenteritis and colitis, unspecified: Principal | ICD-10-CM | POA: Diagnosis present

## 2014-01-17 DIAGNOSIS — R1084 Generalized abdominal pain: Secondary | ICD-10-CM

## 2014-01-17 DIAGNOSIS — M810 Age-related osteoporosis without current pathological fracture: Secondary | ICD-10-CM | POA: Diagnosis present

## 2014-01-17 DIAGNOSIS — R109 Unspecified abdominal pain: Secondary | ICD-10-CM

## 2014-01-17 DIAGNOSIS — N390 Urinary tract infection, site not specified: Secondary | ICD-10-CM | POA: Diagnosis present

## 2014-01-17 DIAGNOSIS — R1110 Vomiting, unspecified: Secondary | ICD-10-CM

## 2014-01-17 DIAGNOSIS — G40909 Epilepsy, unspecified, not intractable, without status epilepticus: Secondary | ICD-10-CM | POA: Diagnosis present

## 2014-01-17 DIAGNOSIS — K5900 Constipation, unspecified: Secondary | ICD-10-CM

## 2014-01-17 DIAGNOSIS — M419 Scoliosis, unspecified: Secondary | ICD-10-CM | POA: Diagnosis present

## 2014-01-17 DIAGNOSIS — M8580 Other specified disorders of bone density and structure, unspecified site: Secondary | ICD-10-CM | POA: Diagnosis present

## 2014-01-17 DIAGNOSIS — F72 Severe intellectual disabilities: Secondary | ICD-10-CM | POA: Diagnosis present

## 2014-01-17 DIAGNOSIS — Z7901 Long term (current) use of anticoagulants: Secondary | ICD-10-CM

## 2014-01-17 DIAGNOSIS — F79 Unspecified intellectual disabilities: Secondary | ICD-10-CM

## 2014-01-17 HISTORY — DX: Dysphagia, unspecified: R13.10

## 2014-01-17 HISTORY — DX: Other specified disorders of bone density and structure, unspecified site: M85.80

## 2014-01-17 HISTORY — DX: Other encephalopathy: G93.49

## 2014-01-17 HISTORY — DX: Epilepsy, unspecified, not intractable, without status epilepticus: G40.909

## 2014-01-17 HISTORY — DX: Age-related osteoporosis without current pathological fracture: M81.0

## 2014-01-17 LAB — URINALYSIS, ROUTINE W REFLEX MICROSCOPIC
Bilirubin Urine: NEGATIVE
Glucose, UA: NEGATIVE mg/dL
Ketones, ur: NEGATIVE mg/dL
Leukocytes, UA: NEGATIVE
Nitrite: NEGATIVE
Protein, ur: NEGATIVE mg/dL
SPECIFIC GRAVITY, URINE: 1.025 (ref 1.005–1.030)
Urobilinogen, UA: 0.2 mg/dL (ref 0.0–1.0)
pH: 6.5 (ref 5.0–8.0)

## 2014-01-17 LAB — COMPREHENSIVE METABOLIC PANEL
ALK PHOS: 45 U/L (ref 39–117)
ALT: 11 U/L (ref 0–35)
AST: 28 U/L (ref 0–37)
Albumin: 4 g/dL (ref 3.5–5.2)
Anion gap: 7 (ref 5–15)
BILIRUBIN TOTAL: 0.5 mg/dL (ref 0.3–1.2)
BUN: 15 mg/dL (ref 6–23)
CHLORIDE: 111 meq/L (ref 96–112)
CO2: 25 mmol/L (ref 19–32)
Calcium: 9.3 mg/dL (ref 8.4–10.5)
Creatinine, Ser: 0.48 mg/dL — ABNORMAL LOW (ref 0.50–1.10)
GLUCOSE: 145 mg/dL — AB (ref 70–99)
POTASSIUM: 3.6 mmol/L (ref 3.5–5.1)
SODIUM: 143 mmol/L (ref 135–145)
Total Protein: 7.6 g/dL (ref 6.0–8.3)

## 2014-01-17 LAB — CBC WITH DIFFERENTIAL/PLATELET
BASOS PCT: 0 % (ref 0–1)
Basophils Absolute: 0 10*3/uL (ref 0.0–0.1)
EOS PCT: 0 % (ref 0–5)
Eosinophils Absolute: 0 10*3/uL (ref 0.0–0.7)
HCT: 38.8 % (ref 36.0–46.0)
Hemoglobin: 12.9 g/dL (ref 12.0–15.0)
Lymphocytes Relative: 11 % — ABNORMAL LOW (ref 12–46)
Lymphs Abs: 1.1 10*3/uL (ref 0.7–4.0)
MCH: 26.8 pg (ref 26.0–34.0)
MCHC: 33.2 g/dL (ref 30.0–36.0)
MCV: 80.7 fL (ref 78.0–100.0)
Monocytes Absolute: 1 10*3/uL (ref 0.1–1.0)
Monocytes Relative: 10 % (ref 3–12)
NEUTROS ABS: 7.8 10*3/uL — AB (ref 1.7–7.7)
NEUTROS PCT: 79 % — AB (ref 43–77)
Platelets: 216 10*3/uL (ref 150–400)
RBC: 4.81 MIL/uL (ref 3.87–5.11)
RDW: 14.1 % (ref 11.5–15.5)
WBC: 9.9 10*3/uL (ref 4.0–10.5)

## 2014-01-17 LAB — URINE MICROSCOPIC-ADD ON

## 2014-01-17 LAB — I-STAT CG4 LACTIC ACID, ED
LACTIC ACID, VENOUS: 4.47 mmol/L — AB (ref 0.5–2.2)
LACTIC ACID, VENOUS: 2.16 mmol/L (ref 0.5–2.2)

## 2014-01-17 LAB — LIPASE, BLOOD: Lipase: 26 U/L (ref 11–59)

## 2014-01-17 LAB — POC URINE PREG, ED: Preg Test, Ur: NEGATIVE

## 2014-01-17 MED ORDER — SODIUM CHLORIDE 0.9 % IV SOLN: Freq: Once | INTRAVENOUS | Status: AC

## 2014-01-17 MED ORDER — HEPARIN SODIUM (PORCINE) 5000 UNIT/ML IJ SOLN
5000.0000 [IU] | Freq: Three times a day (TID) | INTRAMUSCULAR | Status: DC
  Administered 2014-01-18 – 2014-01-21 (×11): 5000 [IU] via SUBCUTANEOUS
  Filled 2014-01-17 (×14): qty 1

## 2014-01-17 MED ORDER — SODIUM CHLORIDE 0.9 % IV BOLUS (SEPSIS)
1000.0000 mL | Freq: Once | INTRAVENOUS | Status: AC
  Administered 2014-01-17: 1000 mL via INTRAVENOUS

## 2014-01-17 MED ORDER — IOHEXOL 300 MG/ML  SOLN
50.0000 mL | Freq: Once | INTRAMUSCULAR | Status: AC | PRN
Start: 2014-01-17 — End: 2014-01-17
  Administered 2014-01-17: 50 mL via ORAL

## 2014-01-17 MED ORDER — ONDANSETRON HCL 4 MG PO TABS
4.0000 mg | ORAL_TABLET | Freq: Four times a day (QID) | ORAL | Status: DC
  Filled 2014-01-17 (×12): qty 1

## 2014-01-17 MED ORDER — SODIUM CHLORIDE 0.9 % IV SOLN
INTRAVENOUS | Status: DC
  Administered 2014-01-18 – 2014-01-21 (×4): via INTRAVENOUS

## 2014-01-17 MED ORDER — ONDANSETRON HCL 4 MG/2ML IJ SOLN
4.0000 mg | Freq: Four times a day (QID) | INTRAMUSCULAR | Status: DC
  Administered 2014-01-18 – 2014-01-20 (×10): 4 mg via INTRAVENOUS
  Filled 2014-01-17 (×10): qty 2

## 2014-01-17 MED ORDER — VALPROIC ACID 250 MG/5ML PO SYRP
500.0000 mg | ORAL_SOLUTION | Freq: Three times a day (TID) | ORAL | Status: DC
  Administered 2014-01-18 – 2014-01-21 (×11): 500 mg via ORAL
  Filled 2014-01-17 (×13): qty 10

## 2014-01-17 MED ORDER — IOHEXOL 300 MG/ML  SOLN
100.0000 mL | Freq: Once | INTRAMUSCULAR | Status: AC | PRN
  Administered 2014-01-17: 80 mL via INTRAVENOUS

## 2014-01-17 MED ORDER — SENNA 8.6 MG PO TABS
1.0000 | ORAL_TABLET | Freq: Two times a day (BID) | ORAL | Status: DC
  Administered 2014-01-18 – 2014-01-21 (×8): 8.6 mg via ORAL
  Filled 2014-01-17 (×8): qty 1

## 2014-01-17 MED ORDER — METRONIDAZOLE IN NACL 5-0.79 MG/ML-% IV SOLN
500.0000 mg | Freq: Three times a day (TID) | INTRAVENOUS | Status: DC
  Administered 2014-01-18 – 2014-01-20 (×8): 500 mg via INTRAVENOUS
  Filled 2014-01-17 (×9): qty 100

## 2014-01-17 MED ORDER — BACLOFEN 20 MG PO TABS
20.0000 mg | ORAL_TABLET | Freq: Three times a day (TID) | ORAL | Status: DC
  Administered 2014-01-18 – 2014-01-21 (×11): 20 mg via ORAL
  Filled 2014-01-17 (×13): qty 1

## 2014-01-17 MED ORDER — SCOPOLAMINE 1 MG/3DAYS TD PT72
1.0000 | MEDICATED_PATCH | TRANSDERMAL | Status: DC
  Administered 2014-01-20: 1.5 mg via TRANSDERMAL
  Filled 2014-01-17: qty 1

## 2014-01-17 MED ORDER — CARBAMAZEPINE ER 200 MG PO CP12
600.0000 mg | ORAL_CAPSULE | Freq: Two times a day (BID) | ORAL | Status: DC
  Administered 2014-01-17 – 2014-01-21 (×8): 600 mg via ORAL
  Filled 2014-01-17 (×9): qty 3

## 2014-01-17 MED ORDER — POLYETHYLENE GLYCOL 3350 17 G PO PACK
17.0000 g | PACK | Freq: Two times a day (BID) | ORAL | Status: DC
  Administered 2014-01-19 – 2014-01-21 (×3): 17 g via ORAL
  Filled 2014-01-17 (×9): qty 1

## 2014-01-17 MED ORDER — SODIUM CHLORIDE 0.9 % IV BOLUS (SEPSIS)
1000.0000 mL | Freq: Once | INTRAVENOUS | Status: AC
  Administered 2014-01-18: 1000 mL via INTRAVENOUS

## 2014-01-17 MED ORDER — CIPROFLOXACIN IN D5W 400 MG/200ML IV SOLN
400.0000 mg | Freq: Two times a day (BID) | INTRAVENOUS | Status: DC
  Administered 2014-01-18 – 2014-01-20 (×5): 400 mg via INTRAVENOUS
  Filled 2014-01-17 (×6): qty 200

## 2014-01-17 NOTE — ED Provider Notes (Signed)
CSN: [id number]     Arrival date & time 01/17/14  1743 History   First MD Initiated Contact with Patient 01/17/14 1750     Chief Complaint  Patient presents with  . Emesis  . Constipation     (Consider location/radiation/quality/duration/timing/severity/associated sxs/prior Treatment) HPI Sheila Costa is a 29 y.o. female with hx of MR, static encephalopathy with spastic quadriparesis, seizure disorder, constipation, presents to ED with complaint of vomiting. Pt non verbal and unable to provide any information. Information provided by EMS and facility worker who is here with pt. Apparently pt with frequent constipation. Pt has not had a bowel movement in several day. Today pt started vomiting. Pt received an enema, thinking she was impacted with no results. EMS was called. She received zofran.  Past Medical History  Diagnosis Date  . Mental retardation   . Spastic quadriparesis   . Seizure disorder   . Scoliosis   . Static encephalopathy   . Osteopenia   . Dysphagia   . Osteoporosis   . Constipation    No past surgical history on file. No family history on file. History  Substance Use Topics  . Smoking status: Not on file  . Smokeless tobacco: Not on file  . Alcohol Use: Not on file   OB History    No data available     Review of Systems  Unable to perform ROS: Patient nonverbal      Allergies  Review of patient's allergies indicates no known allergies.  Home Medications   Prior to Admission medications   Not on File   BP 130/78 mmHg  Pulse 85  Temp(Src) 97.7 F (36.5 C) (Oral)  Resp 18  SpO2 100% Physical Exam  Constitutional: She appears well-developed and well-nourished. No distress.  HENT:  Head: Normocephalic.  Eyes: Conjunctivae are normal.  Neck: Neck supple.  Cardiovascular: Normal rate, regular rhythm and normal heart sounds.   Pulmonary/Chest: Effort normal and breath sounds normal. No respiratory distress. She has no wheezes. She has no  rales.  Abdominal: Soft. Bowel sounds are normal. She exhibits no distension. There is no tenderness. There is no rebound.  Musculoskeletal: She exhibits no edema.  Neurological: She is alert.  Skin: Skin is warm and dry.  Nursing note and vitals reviewed.   ED Course  Procedures (including critical care time) Labs Review Labs Reviewed  CBC WITH DIFFERENTIAL - Abnormal; Notable for the following:    Neutrophils Relative % 79 (*)    Neutro Abs 7.8 (*)    Lymphocytes Relative 11 (*)    All other components within normal limits  COMPREHENSIVE METABOLIC PANEL - Abnormal; Notable for the following:    Glucose, Bld 145 (*)    Creatinine, Ser 0.48 (*)    All other components within normal limits  URINALYSIS, ROUTINE W REFLEX MICROSCOPIC - Abnormal; Notable for the following:    Color, Urine AMBER (*)    Hgb urine dipstick SMALL (*)    All other components within normal limits  URINE MICROSCOPIC-ADD ON - Abnormal; Notable for the following:    Bacteria, UA FEW (*)    All other components within normal limits  I-STAT CG4 LACTIC ACID, ED - Abnormal; Notable for the following:    Lactic Acid, Venous 4.47 (*)    All other components within normal limits  CULTURE, BLOOD (ROUTINE X 2)  CULTURE, BLOOD (ROUTINE X 2)  URINE CULTURE  LIPASE, BLOOD  CBC WITH DIFFERENTIAL  COMPREHENSIVE METABOLIC PANEL  POC URINE PREG,  ED  I-STAT CG4 LACTIC ACID, ED    Imaging Review Ct Abdomen Pelvis W Contrast  01/17/2014   CLINICAL DATA:  Vomiting and abdominal pain.  EXAM: CT ABDOMEN AND PELVIS WITH CONTRAST  TECHNIQUE: Multidetector CT imaging of the abdomen and pelvis was performed using the standard protocol following bolus administration of intravenous contrast.  CONTRAST:  50mL OMNIPAQUE IOHEXOL 300 MG/ML SOLN, 80mL OMNIPAQUE IOHEXOL 300 MG/ML SOLN  COMPARISON:  Radiographs earlier the same day.  FINDINGS: No pleural effusion or parenchymal consolidation in the lung bases. Minimal atelectasis in the  dependent left lower lobe. There is pectus deformity of the lower thoracic cage related to scoliotic curvature of the spine.  Streak artifact from spinal fusion hardware. There is some oral contrast within the stomach, no gastric distention is seen. Bowel evaluation is limited due to lack contrast and paucity of intra-abdominal fat. No small bowel dilatation. There are fluid-filled but normal caliber bowel loops in the right lower abdomen. Small volume of stool is seen in the ascending and transverse colon. Moderate stool is seen in the rectosigmoid colon. There is minimal soft tissue thickening in the region of the distal sigmoid colon and rectum the can be seen with chronic constipation. The cecum appears high riding, the appendix tentatively identified in the right upper quadrant of the abdomen and appears normal.  No focal hepatic lesion. The gallbladder is physiologically distended. Spleen and pancreas are unremarkable. The adrenal glands tentatively identified and normal. There is symmetric renal enhancement. No hydronephrosis. Retroperitoneum is obscured by streak artifact. There is no definite free air, free fluid, or intra-abdominal fluid collection.  Bladder is physiologically distended, question wall thickening involving the dome. Uterus appears normal in size. Neither ovary is discretely seen, no gross adnexal mass. No pelvic free fluid.  Posterior spinal fusion throughout the entire included spine. The bones appear under mineralized. No acute osseous abnormality.  IMPRESSION: 1. Fluid-filled small bowel loops in the right abdomen, may reflect enteritis. No bowel dilatation to suggest obstruction. Soft tissue thickening in the rectosigmoid colon, can be seen with chronic constipation, moderate volume of stool is seen in the sigmoid colon at this time. Detailed bowel evaluation is limited. 2. Question bladder wall thickening involving the dome. Correlation with urinalysis recommended to exclude urinary  tract infection. 3. Extensive posterior fusion throughout the spine.   Electronically Signed   By: Rubye Oaks M.D.   On: 01/17/2014 21:29   Dg Abd Acute W/chest  01/17/2014   CLINICAL DATA:  Vomiting.  EXAM: ACUTE ABDOMEN SERIES (ABDOMEN 2 VIEW & CHEST 1 VIEW)  COMPARISON:  None.  FINDINGS: Low lung volumes. Crowding of bronchovascular structures likely related to low lung volumes. No confluent airspace disease to suggest pneumonia. The cardiomediastinal contours are normal for technique.  There is no free intra-abdominal air. There is a generalized paucity of intra-abdominal bowel gas. No dilated bowel loops to suggest obstruction. Small volume of stool and air is seen in the rectosigmoid colon. There are no radiopaque calculi. There is extensive posterior spinal fusion throughout the entire thoracolumbar spine and sacrum.  IMPRESSION: 1. Paucity of intra-abdominal bowel gas. No dilated bowel loops to suggest obstruction. No free air. 2. Hypoventilatory chest.   Electronically Signed   By: Rubye Oaks M.D.   On: 01/17/2014 19:53     EKG Interpretation None      MDM   Final diagnoses:  Vomiting  Abdominal pain    Patient with no normal bowel movement for several days,  vomiting onset this morning. Patient is at baseline mental status wise. Temperature rectally 96.7, warm blankets placed. Vital signs are normal otherwise. We'll get lab work, urinalysis, blood cultures, septic workup given hypothermia.  Initial lactic acid is 4.47. Normal white blood cell count. CT abdomen and pelvis ordered. Patient has not had any more episodes of vomiting in ED. IV fluids are running.   Patient CT scan of the abdomen and pelvis showing enteritis and possible constipation. Given patient's elevated lactic acid, hypothermia, possibility of sepsis is not excluded. Will continue IV fluids, will admit for observation. Spoke with tried hospitalist who will see patient  Filed Vitals:   01/17/14 1744  01/17/14 1800 01/17/14 1857 01/17/14 2337  BP: 125/83 130/78  105/73  Pulse: 74 88  113  Temp: 97.7 F (36.5 C)  96.7 F (35.9 C)   TempSrc: Oral  Rectal   Resp: 16 18  16   Weight:    108 lb 14.5 oz (49.4 kg)  SpO2: 100% 100%  100%      Lottie Mussel, PA-C 01/18/14 0107  Toy Baker, MD 01/20/14 1325

## 2014-01-17 NOTE — ED Notes (Signed)
Patient transported to X-ray 

## 2014-01-17 NOTE — ED Notes (Signed)
Back from XRay

## 2014-01-17 NOTE — ED Notes (Signed)
Pt from [facility]. EMS called out for vomitting. Pt had 6 emesis since 1300 this afternoon. Unable to keep down any oral medications, including oral zofran. Reported last bowel movement on Saturday afternoon, EMS reported decreased bowel sounds. Care home reports mental status of an infant, and that she has been acting in her normal state and that they would be sending an aide here shortly.

## 2014-01-17 NOTE — H&P (Addendum)
Hospitalist Admission History and Physical  Patient name: Sheila Costa Medical record number: [id number] Date of birth: 08-31-1985 Age: 29 y.o. Gender: female  Primary Care Provider: No primary care provider on file.  Chief Complaint: enteritis  History of Present Illness:This is a 29 y.o. year old female with significant past medical history of moderate to severe MR, spastic quadriplegia, scoliosis, chronic constipation presenting with enteritis. Pt is resident of local group home. Per report, pt had multiple episodes of NBNB emesis at center. This has happened in the past and has been associated w/ severe constipation. Pt was given enema at center with no improvement in emesis. Pt was unable to hold down any medications.  Presented to ER w/ rectal temp 96.7 (unclear as to when enema was given). 97.7 on recheck. HR 70s-80s, resp 10s, BP 120s-130s, satting 100% on RA. CBC and BMET WNL. Lactate 4.47-->2 s/p IVFs. UA minimally indicative of infection. KUB w/ chest grossly benign. CT abd and pelvis Fluid-filled small bowel loops in the right abdomen, may reflect enteritis. No bowel dilatation to suggest obstruction. Soft tissue thickening in the rectosigmoid colon, can be seen with chronic constipation, moderate volume of stool is seen in the sigmoid colonQuestion bladder wall thickening involving the dome. Correlation with urinalysis recommended to exclude urinary tract infection.  Assessment and Plan: Sheila Costa is a 29 y.o. year old female presenting with enteritis   Active Problems:   Enteritis   1- Enteritis  -Place on cipro and flagyl for infectious  -no reports of diarrhea-hold on stool studies  -pan culture given ? Low temp (unclear if this may have been from enema-normal temp on recheck) -lactic acidosis resolved s/p IVFs -follow  -antiemetics -repeat CXR in am to r/o CAP given recurrent emesis   2- MR/spastic quadriplegia -at baseline currently  -cont home regimen as pt  can tolerate -follow closely  3- ? UTI  - UA minimally indicative of infection  -? UTI findings on CT -pt nonverbal  -urine culture -cipro should give adequate coverage in the interim.   FEN/GI: NPO for now  Prophylaxis: sub q heparin Disposition: pending further evaluation  Code Status:Full Code    Patient Active Problem List   Diagnosis Date Noted  . Enteritis 01/17/2014   Past Medical History: Past Medical History  Diagnosis Date  . Mental retardation   . Spastic quadriparesis   . Seizure disorder   . Scoliosis   . Static encephalopathy   . Osteopenia   . Dysphagia   . Osteoporosis   . Constipation     Past Surgical History: No past surgical history on file.  Social History: History   Social History  . Marital Status: Single    Spouse Name: N/A    Number of Children: N/A  . Years of Education: N/A   Social History Main Topics  . Smoking status: None  . Smokeless tobacco: None  . Alcohol Use: None  . Drug Use: None  . Sexual Activity: None   Other Topics Concern  . None   Social History Narrative  . None    Family History: No family history on file.  Allergies: No Known Allergies  Current Facility-Administered Medications  Medication Dose Route Frequency Provider Last Rate Last Dose  . 0.9 %  sodium chloride infusion   Intravenous Once Tatyana A Kirichenko, PA-C      . 0.9 %  sodium chloride infusion   Intravenous Continuous Doree Albee, MD      . baclofen (LIORESAL) tablet  20 mg  20 mg Oral TID Doree Albee, MD      . Carbamazepine [facility] [place]) CP12 600 mg  600 mg Oral BID Doree Albee, MD      . heparin injection 5,000 Units  5,000 Units Subcutaneous 3 times per day Doree Albee, MD      . Melene Muller ON 01/18/2014] ondansetron [facility]) tablet 4 mg  4 mg Oral 4 times per day Doree Albee, MD       Or  . Melene Muller ON 01/18/2014] ondansetron (ZOFRAN) injection 4 mg  4 mg Intravenous 4 times per day Doree Albee, MD      . polyethylene glycol  (MIRALAX / GLYCOLAX) packet 17 g  17 g Oral BID Doree Albee, MD      . scopolamine (TRANSDERM-SCOP) 1 MG/3DAYS 1.5 mg  1 patch Transdermal Q72H Doree Albee, MD      . [facility] [address]) tablet 8.6 mg  1 tablet Oral BID Doree Albee, MD      . sodium chloride 0.9 % bolus 1,000 mL  1,000 mL Intravenous Once Tatyana A Kirichenko, PA-C      . Valproic Acid (DEPAKENE) 250 MG/5ML syrup SYRP 500 mg  500 mg Oral TID Doree Albee, MD       Current Outpatient Prescriptions  Medication Sig Dispense Refill  . baclofen (LIORESAL) 20 MG tablet Take 20 mg by mouth 3 (three) times daily.    . calcium-vitamin D (OSCAL WITH D) 500-200 MG-UNIT per tablet Take 1 tablet by mouth.    . Carbamazepine (EQUETRO) 300 MG CP12 Take 600 mg by mouth 2 (two) times daily.    . cholecalciferol (VITAMIN D) 1000 UNITS tablet Take 2,000 Units by mouth daily.    Marland Kitchen ibuprofen (ADVIL,MOTRIN) 600 MG tablet Take 600 mg by mouth every 8 (eight) hours as needed for moderate pain.    Marland Kitchen loratadine (CLARITIN) 10 MG tablet Take 10 mg by mouth daily.    . magnesium oxide (MAG-OX) 400 MG tablet Take 400 mg by mouth 3 (three) times daily.    . ondansetron (ZOFRAN-ODT) 4 MG disintegrating tablet Take 4 mg by mouth every 8 (eight) hours as needed for nausea or vomiting.    . polyethylene glycol (MIRALAX / GLYCOLAX) packet Take 17 g by mouth 2 (two) times daily.    . Probiotic Product (RISA-BID PROBIOTIC PO) Take 1 capsule by mouth 2 (two) times daily.    Marland Kitchen scopolamine (TRANSDERM-SCOP) 1 MG/3DAYS Place 1 patch onto the skin every 3 (three) days.    Marland Kitchen senna (SENOKOT) 8.6 MG TABS tablet Take 1 tablet by mouth 2 (two) times daily.    . Skin Protectants, Misc. (LIP BALM EX) Apply 1 application topically as needed (dry lips).    . sodium phosphate (FLEET) enema Place 1 enema rectally once. follow package directions    . sorbitol 70 % solution Take 15 mLs by mouth at bedtime.    . Valproic Acid (DEPAKENE) 250 MG/5ML SYRP syrup Take 500 mg by mouth 3  (three) times daily.     Review Of Systems: 12 point ROS negative except as noted above in HPI.  Physical Exam: Filed Vitals:   01/17/14 1857  BP:   Pulse:   Temp: 96.7 F (35.9 C)  Resp:     General: nonverbal, no eye contact, unresponsive to commands  HEENT: PERRL, minimal EOM Heart: S1, S2 normal, no murmur, rub or gallop, regular rate and rhythm Lungs: clear to auscultation, no wheezes or rales and unlabored breathing Abdomen:  abdomen is soft without significant tenderness, masses, organomegaly or guarding Extremities: spastic extremities, 2+ peripheral pulses  Skin:no rashes Neurology: non verbal, unresponsive to commands, spastic   Labs and Imaging: Lab Results  Component Value Date/Time   NA 143 01/17/2014 06:24 PM   K 3.6 01/17/2014 06:24 PM   CL 111 01/17/2014 06:24 PM   CO2 25 01/17/2014 06:24 PM   BUN 15 01/17/2014 06:24 PM   CREATININE 0.48* 01/17/2014 06:24 PM   GLUCOSE 145* 01/17/2014 06:24 PM   Lab Results  Component Value Date   WBC 9.9 01/17/2014   HGB 12.9 01/17/2014   HCT 38.8 01/17/2014   MCV 80.7 01/17/2014   PLT 216 01/17/2014    Ct Abdomen Pelvis W Contrast  01/17/2014   CLINICAL DATA:  Vomiting and abdominal pain.  EXAM: CT ABDOMEN AND PELVIS WITH CONTRAST  TECHNIQUE: Multidetector CT imaging of the abdomen and pelvis was performed using the standard protocol following bolus administration of intravenous contrast.  CONTRAST:  50mL OMNIPAQUE IOHEXOL 300 MG/ML SOLN, 80mL OMNIPAQUE IOHEXOL 300 MG/ML SOLN  COMPARISON:  Radiographs earlier the same day.  FINDINGS: No pleural effusion or parenchymal consolidation in the lung bases. Minimal atelectasis in the dependent left lower lobe. There is pectus deformity of the lower thoracic cage related to scoliotic curvature of the spine.  Streak artifact from spinal fusion hardware. There is some oral contrast within the stomach, no gastric distention is seen. Bowel evaluation is limited due to lack contrast and  paucity of intra-abdominal fat. No small bowel dilatation. There are fluid-filled but normal caliber bowel loops in the right lower abdomen. Small volume of stool is seen in the ascending and transverse colon. Moderate stool is seen in the rectosigmoid colon. There is minimal soft tissue thickening in the region of the distal sigmoid colon and rectum the can be seen with chronic constipation. The cecum appears high riding, the appendix tentatively identified in the right upper quadrant of the abdomen and appears normal.  No focal hepatic lesion. The gallbladder is physiologically distended. Spleen and pancreas are unremarkable. The adrenal glands tentatively identified and normal. There is symmetric renal enhancement. No hydronephrosis. Retroperitoneum is obscured by streak artifact. There is no definite free air, free fluid, or intra-abdominal fluid collection.  Bladder is physiologically distended, question wall thickening involving the dome. Uterus appears normal in size. Neither ovary is discretely seen, no gross adnexal mass. No pelvic free fluid.  Posterior spinal fusion throughout the entire included spine. The bones appear under mineralized. No acute osseous abnormality.  IMPRESSION: 1. Fluid-filled small bowel loops in the right abdomen, may reflect enteritis. No bowel dilatation to suggest obstruction. Soft tissue thickening in the rectosigmoid colon, can be seen with chronic constipation, moderate volume of stool is seen in the sigmoid colon at this time. Detailed bowel evaluation is limited. 2. Question bladder wall thickening involving the dome. Correlation with urinalysis recommended to exclude urinary tract infection. 3. Extensive posterior fusion throughout the spine.   Electronically Signed   By: Rubye Oaks M.D.   On: 01/17/2014 21:29   Dg Abd Acute W/chest  01/17/2014   CLINICAL DATA:  Vomiting.  EXAM: ACUTE ABDOMEN SERIES (ABDOMEN 2 VIEW & CHEST 1 VIEW)  COMPARISON:  None.  FINDINGS: Low  lung volumes. Crowding of bronchovascular structures likely related to low lung volumes. No confluent airspace disease to suggest pneumonia. The cardiomediastinal contours are normal for technique.  There is no free intra-abdominal air. There is a generalized paucity of intra-abdominal  bowel gas. No dilated bowel loops to suggest obstruction. Small volume of stool and air is seen in the rectosigmoid colon. There are no radiopaque calculi. There is extensive posterior spinal fusion throughout the entire thoracolumbar spine and sacrum.  IMPRESSION: 1. Paucity of intra-abdominal bowel gas. No dilated bowel loops to suggest obstruction. No free air. 2. Hypoventilatory chest.   Electronically Signed   By: Rubye Oaks M.D.   On: 01/17/2014 19:53           Doree Albee MD  Pager: [phone]

## 2014-01-17 NOTE — ED Notes (Signed)
Windell Moment, nurse at [facility], on patient's condition.

## 2014-01-17 NOTE — ED Notes (Signed)
Patient transported to CT 

## 2014-01-17 NOTE — ED Notes (Signed)
Bed: WA17 Expected date:  Expected time:  Means of arrival:  Comments: EMS- emesis, not Triage appropriate

## 2014-01-18 ENCOUNTER — Observation Stay (HOSPITAL_COMMUNITY): Payer: Medicare Other

## 2014-01-18 DIAGNOSIS — F72 Severe intellectual disabilities: Secondary | ICD-10-CM | POA: Diagnosis present

## 2014-01-18 DIAGNOSIS — F79 Unspecified intellectual disabilities: Secondary | ICD-10-CM

## 2014-01-18 DIAGNOSIS — M8580 Other specified disorders of bone density and structure, unspecified site: Secondary | ICD-10-CM | POA: Diagnosis present

## 2014-01-18 DIAGNOSIS — G40909 Epilepsy, unspecified, not intractable, without status epilepticus: Secondary | ICD-10-CM | POA: Diagnosis present

## 2014-01-18 DIAGNOSIS — N390 Urinary tract infection, site not specified: Secondary | ICD-10-CM | POA: Diagnosis present

## 2014-01-18 DIAGNOSIS — M419 Scoliosis, unspecified: Secondary | ICD-10-CM | POA: Diagnosis present

## 2014-01-18 DIAGNOSIS — G8250 Quadriplegia, unspecified: Secondary | ICD-10-CM | POA: Diagnosis present

## 2014-01-18 DIAGNOSIS — M810 Age-related osteoporosis without current pathological fracture: Secondary | ICD-10-CM | POA: Diagnosis present

## 2014-01-18 DIAGNOSIS — Z7901 Long term (current) use of anticoagulants: Secondary | ICD-10-CM | POA: Diagnosis not present

## 2014-01-18 DIAGNOSIS — K529 Noninfective gastroenteritis and colitis, unspecified: Secondary | ICD-10-CM | POA: Diagnosis present

## 2014-01-18 DIAGNOSIS — K5909 Other constipation: Secondary | ICD-10-CM | POA: Diagnosis not present

## 2014-01-18 DIAGNOSIS — K5900 Constipation, unspecified: Secondary | ICD-10-CM | POA: Diagnosis present

## 2014-01-18 DIAGNOSIS — E872 Acidosis: Secondary | ICD-10-CM | POA: Diagnosis present

## 2014-01-18 LAB — CBC WITH DIFFERENTIAL/PLATELET
BASOS PCT: 0 % (ref 0–1)
Basophils Absolute: 0 10*3/uL (ref 0.0–0.1)
Eosinophils Absolute: 0 10*3/uL (ref 0.0–0.7)
Eosinophils Relative: 0 % (ref 0–5)
HEMATOCRIT: 34.8 % — AB (ref 36.0–46.0)
Hemoglobin: 11.1 g/dL — ABNORMAL LOW (ref 12.0–15.0)
Lymphocytes Relative: 35 % (ref 12–46)
Lymphs Abs: 3.1 10*3/uL (ref 0.7–4.0)
MCH: 25.8 pg — ABNORMAL LOW (ref 26.0–34.0)
MCHC: 31.9 g/dL (ref 30.0–36.0)
MCV: 80.9 fL (ref 78.0–100.0)
MONO ABS: 1.2 10*3/uL — AB (ref 0.1–1.0)
Monocytes Relative: 13 % — ABNORMAL HIGH (ref 3–12)
NEUTROS ABS: 4.6 10*3/uL (ref 1.7–7.7)
Neutrophils Relative %: 52 % (ref 43–77)
Platelets: 164 10*3/uL (ref 150–400)
RBC: 4.3 MIL/uL (ref 3.87–5.11)
RDW: 14.2 % (ref 11.5–15.5)
WBC: 9 10*3/uL (ref 4.0–10.5)

## 2014-01-18 LAB — COMPREHENSIVE METABOLIC PANEL
ALK PHOS: 37 U/L — AB (ref 39–117)
ALT: 8 U/L (ref 0–35)
ANION GAP: 6 (ref 5–15)
AST: 19 U/L (ref 0–37)
Albumin: 3.2 g/dL — ABNORMAL LOW (ref 3.5–5.2)
BUN: 7 mg/dL (ref 6–23)
CO2: 24 mmol/L (ref 19–32)
Calcium: 8 mg/dL — ABNORMAL LOW (ref 8.4–10.5)
Chloride: 111 mEq/L (ref 96–112)
Creatinine, Ser: 0.43 mg/dL — ABNORMAL LOW (ref 0.50–1.10)
GFR calc Af Amer: >90 mL/min (ref >90)
GFR calc non Af Amer: >90 mL/min (ref >90)
GLUCOSE: 67 mg/dL — AB (ref 70–99)
Potassium: 3.6 mmol/L (ref 3.5–5.1)
Sodium: 141 mmol/L (ref 135–145)
TOTAL PROTEIN: 6.1 g/dL (ref 6.0–8.3)
Total Bilirubin: 0.3 mg/dL (ref 0.3–1.2)

## 2014-01-18 MED ORDER — CETYLPYRIDINIUM CHLORIDE 0.05 % MT LIQD
7.0000 mL | Freq: Two times a day (BID) | OROMUCOSAL | Status: DC
Start: 2014-01-18 — End: 2014-01-21
  Administered 2014-01-18 – 2014-01-19 (×4): 7 mL via OROMUCOSAL

## 2014-01-18 MED ORDER — CHLORHEXIDINE GLUCONATE 0.12 % MT SOLN
15.0000 mL | Freq: Two times a day (BID) | OROMUCOSAL | Status: DC
  Administered 2014-01-18 – 2014-01-20 (×6): 15 mL via OROMUCOSAL
  Filled 2014-01-18 (×11): qty 15

## 2014-01-18 NOTE — Progress Notes (Signed)
[facility] PROGRESS NOTE  Amadea Keagy ZOX:[id number] DOB: 11-01-1985 DOA: 01/17/2014  PCP: No primary care provider on file.  Brief HPI: 29 year old African-American female with a history of moderate to severe mental retardation, spastic quadriplegia, scoliosis, who presented from her group home with vomiting. She was found to have enteritis on CT scan. She was admitted to the hospital  Past medical history:  Past Medical History  Diagnosis Date  . Mental retardation   . Spastic quadriparesis   . Seizure disorder   . Scoliosis   . Static encephalopathy   . Osteopenia   . Dysphagia   . Osteoporosis   . Constipation     Consultants: None  Procedures: None  Antibiotics: Cipro and Flagyl 1/4  Subjective: Patient nonverbal.  Objective: Vital Signs  Filed Vitals:   01/17/14 1857 01/17/14 2130 01/17/14 2337 01/18/14 0525  BP:   105/73 105/64  Pulse:   113 103  Temp: 96.7 F (35.9 C) 98.2 F (36.8 C)  97.4 F (36.3 C)  TempSrc: Rectal   Axillary  Resp:   16 16  Weight:   49.4 kg (108 lb 14.5 oz)   SpO2:   100% 94%    Intake/Output Summary (Last 24 hours) at 01/18/14 0843 Last data filed at 01/18/14 0736  Gross per 24 hour  Intake 1128.33 ml  Output      0 ml  Net 1128.33 ml   Filed Weights   01/17/14 2337  Weight: 49.4 kg (108 lb 14.5 oz)    General appearance: alert, distracted, no distress and slowed mentation Resp: clear to auscultation bilaterally Cardio: regular rate and rhythm, S1, S2 normal, no murmur, click, rub or gallop GI: soft, non-tender; bowel sounds normal; no masses,  no organomegaly Extremities: Contracted upper and lower extremities. Neurologic: He has a history of mental retardation. She is contracted and has spastic extremities.  Lab Results:  Basic Metabolic Panel:  Recent Labs Lab 01/17/14 1824 01/18/14 0435  NA 143 141  K 3.6 3.6  CL 111 111  CO2 25 24  GLUCOSE 145* 67*  BUN 15 7  CREATININE 0.48* 0.43*    CALCIUM 9.3 8.0*   Liver Function Tests:  Recent Labs Lab 01/17/14 1824 01/18/14 0435  AST 28 19  ALT 11 8  ALKPHOS 45 37*  BILITOT 0.5 0.3  PROT 7.6 6.1  ALBUMIN 4.0 3.2*    Recent Labs Lab 01/17/14 1824  LIPASE 26   CBC:  Recent Labs Lab 01/17/14 1824 01/18/14 0435  WBC 9.9 9.0  NEUTROABS 7.8* 4.6  HGB 12.9 11.1*  HCT 38.8 34.8*  MCV 80.7 80.9  PLT 216 164     Studies/Results: Ct Abdomen Pelvis W Contrast  01/17/2014   CLINICAL DATA:  Vomiting and abdominal pain.  EXAM: CT ABDOMEN AND PELVIS WITH CONTRAST  TECHNIQUE: Multidetector CT imaging of the abdomen and pelvis was performed using the standard protocol following bolus administration of intravenous contrast.  CONTRAST:  50mL OMNIPAQUE IOHEXOL 300 MG/ML SOLN, 80mL OMNIPAQUE IOHEXOL 300 MG/ML SOLN  COMPARISON:  Radiographs earlier the same day.  FINDINGS: No pleural effusion or parenchymal consolidation in the lung bases. Minimal atelectasis in the dependent left lower lobe. There is pectus deformity of the lower thoracic cage related to scoliotic curvature of the spine.  Streak artifact from spinal fusion hardware. There is some oral contrast within the stomach, no gastric distention is seen. Bowel evaluation is limited due to lack contrast and paucity of intra-abdominal fat. No small bowel  dilatation. There are fluid-filled but normal caliber bowel loops in the right lower abdomen. Small volume of stool is seen in the ascending and transverse colon. Moderate stool is seen in the rectosigmoid colon. There is minimal soft tissue thickening in the region of the distal sigmoid colon and rectum the can be seen with chronic constipation. The cecum appears high riding, the appendix tentatively identified in the right upper quadrant of the abdomen and appears normal.  No focal hepatic lesion. The gallbladder is physiologically distended. Spleen and pancreas are unremarkable. The adrenal glands tentatively identified and normal.  There is symmetric renal enhancement. No hydronephrosis. Retroperitoneum is obscured by streak artifact. There is no definite free air, free fluid, or intra-abdominal fluid collection.  Bladder is physiologically distended, question wall thickening involving the dome. Uterus appears normal in size. Neither ovary is discretely seen, no gross adnexal mass. No pelvic free fluid.  Posterior spinal fusion throughout the entire included spine. The bones appear under mineralized. No acute osseous abnormality.  IMPRESSION: 1. Fluid-filled small bowel loops in the right abdomen, may reflect enteritis. No bowel dilatation to suggest obstruction. Soft tissue thickening in the rectosigmoid colon, can be seen with chronic constipation, moderate volume of stool is seen in the sigmoid colon at this time. Detailed bowel evaluation is limited. 2. Question bladder wall thickening involving the dome. Correlation with urinalysis recommended to exclude urinary tract infection. 3. Extensive posterior fusion throughout the spine.   Electronically Signed   By: Rubye Oaks M.D.   On: 01/17/2014 21:29   Dg Abd Acute W/chest  01/17/2014   CLINICAL DATA:  Vomiting.  EXAM: ACUTE ABDOMEN SERIES (ABDOMEN 2 VIEW & CHEST 1 VIEW)  COMPARISON:  None.  FINDINGS: Low lung volumes. Crowding of bronchovascular structures likely related to low lung volumes. No confluent airspace disease to suggest pneumonia. The cardiomediastinal contours are normal for technique.  There is no free intra-abdominal air. There is a generalized paucity of intra-abdominal bowel gas. No dilated bowel loops to suggest obstruction. Small volume of stool and air is seen in the rectosigmoid colon. There are no radiopaque calculi. There is extensive posterior spinal fusion throughout the entire thoracolumbar spine and sacrum.  IMPRESSION: 1. Paucity of intra-abdominal bowel gas. No dilated bowel loops to suggest obstruction. No free air. 2. Hypoventilatory chest.    Electronically Signed   By: Rubye Oaks M.D.   On: 01/17/2014 19:53    Medications:  Scheduled: . antiseptic oral rinse  7 mL Mouth Rinse q12n4p  . baclofen  20 mg Oral TID  . carbamazepine  600 mg Oral BID  . chlorhexidine  15 mL Mouth Rinse BID  . ciprofloxacin  400 mg Intravenous Q12H  . heparin  5,000 Units Subcutaneous 3 times per day  . metronidazole  500 mg Intravenous Q8H  . ondansetron  4 mg Oral 4 times per day   Or  . ondansetron (ZOFRAN) IV  4 mg Intravenous 4 times per day  . polyethylene glycol  17 g Oral BID  . [START ON 01/20/2014] scopolamine  1 patch Transdermal Q72H  . senna  1 tablet Oral BID  . Valproic Acid  500 mg Oral TID   Continuous: . sodium chloride 100 mL/hr at 01/18/14 0019   PRN:  Assessment/Plan:  Active Problems:   Enteritis   Possible UTI (lower urinary tract infection)    Enteritis  This is based on CT scan. In presentation was nausea and vomiting. Has not been noted to have any diarrhea.  Continue with Cipro and Flagyl for now. Lactic acid improved. Other nonspecific findings noted on CT scan. No further workup at this time. Continue to follow clinically. Repeat chest x-ray does not show any infiltrates.  MR/spastic quadriplegia Appears to be at baseline currently. Continue home medication regimen  Questionable UTI with possible bladder wall thickening noted on CT UA was not very impressive. Await urine cultures. Antibiotics as above.  DVT Prophylaxis: Heparin    Code Status: Full code  Family Communication: No family at bedside  Disposition Plan: Not ready for discharge    LOS: 1 day   Mercy Health - West HospitalKRISHNAN,Clarissia Mckeen  [facility] Pager [phone] 01/18/2014, 8:43 AM  If 7PM-7AM, please contact night-coverage at [email or web address], password [email or web address]

## 2014-01-18 NOTE — Progress Notes (Signed)
Clinical Social Work Department BRIEF PSYCHOSOCIAL ASSESSMENT 01/18/2014  Patient:  Sheila Costa,Sheila Costa     Account Number:  [id number]     Admit date:  01/17/2014  Clinical Social Worker:  Maryln Manuel  Date/Time:  01/18/2014 01:10 PM  Referred by:  Physician  Date Referred:  01/18/2014 Referred for  Other - See comment   Other Referral:   Pt admitted from [place] type:  Other - See comment Other interview type:   Pt guardian, Lavinia Sharps    PSYCHOSOCIAL DATA Living Status:  FACILITY Admitted from facility:  [place] Level of care:  Group Home Primary support name:  Kennyth Lose [phone]/guardian Primary support relationship to patient:  NONE Degree of support available:   strong    CURRENT CONCERNS Current Concerns  Post-Acute Placement   Other Concerns:    SOCIAL WORK ASSESSMENT / PLAN CSW received referral that pt admitted from [place].    CSW met with pt guardian, Lavinia Sharps at bedside. CSW introduced self and explained role. Pt guardian discussed that pt is a resident at [facility] and plan is for pt to return when medically stable for discharge. Pt guardian discussed that she is going to have some lunch before going to work this afternoon, but can be reached via telephone. CSW expressed understanding.    CSW contacted [place]. CSW was provided RN for [employer] number [phone]) and CSW contacted [facility] Sales promotion account executive and spoke to [employer], [employer]. [facility] [employer] confirmed that pt can return when medically stable. [facility] Lavone Neri stated that pt will not need an FL2, but facility will need discharge summary.    CSW to continue to follow to assist with pt return to [facility] when medically stable for discharge.   Assessment/plan status:  Psychosocial Support/Ongoing Assessment of Needs Other assessment/ plan:   discharge planning   Information/referral to community resources:    Referral back to [place]    PATIENT'S/FAMILY'S RESPONSE TO PLAN OF CARE: Pt disoriented x 4. Pt with history of mental retardation/spastic quadriparesis. Pt gaurdian supportive and actively involved in pt care. Plan is for pt to return to [facility] when medically stable.    Alison Murray, MSW, [place] [facility] [phone]

## 2014-01-18 NOTE — Progress Notes (Signed)
UR completed 

## 2014-01-19 DIAGNOSIS — K5909 Other constipation: Secondary | ICD-10-CM

## 2014-01-19 LAB — CBC
HEMATOCRIT: 39.2 % (ref 36.0–46.0)
Hemoglobin: 12.6 g/dL (ref 12.0–15.0)
MCH: 26 pg (ref 26.0–34.0)
MCHC: 32.1 g/dL (ref 30.0–36.0)
MCV: 80.8 fL (ref 78.0–100.0)
PLATELETS: 162 10*3/uL (ref 150–400)
RBC: 4.85 MIL/uL (ref 3.87–5.11)
RDW: 13.9 % (ref 11.5–15.5)
WBC: 4.5 10*3/uL (ref 4.0–10.5)

## 2014-01-19 LAB — BASIC METABOLIC PANEL
ANION GAP: 7 (ref 5–15)
BUN: <5 mg/dL — ABNORMAL LOW (ref 6–23)
CALCIUM: 8.5 mg/dL (ref 8.4–10.5)
CO2: 28 mmol/L (ref 19–32)
Chloride: 106 mEq/L (ref 96–112)
Creatinine, Ser: 0.52 mg/dL (ref 0.50–1.10)
GFR calc non Af Amer: >90 mL/min (ref >90)
Glucose, Bld: 84 mg/dL (ref 70–99)
POTASSIUM: 3.7 mmol/L (ref 3.5–5.1)
SODIUM: 141 mmol/L (ref 135–145)

## 2014-01-19 MED ORDER — LINACLOTIDE 145 MCG PO CAPS
145.0000 ug | ORAL_CAPSULE | Freq: Every day | ORAL | Status: DC
  Administered 2014-01-19 – 2014-01-21 (×3): 145 ug via ORAL
  Filled 2014-01-19 (×3): qty 1

## 2014-01-19 MED ORDER — MAGNESIUM CITRATE PO SOLN: 1.0000 | Freq: Once | ORAL | Status: DC

## 2014-01-19 NOTE — Care Management Note (Signed)
CARE MANAGEMENT NOTE 01/19/2014  Patient:  Barro,Tabytha   Account Number:  [id number]  Date Initiated:  01/19/2014  Documentation initiated by:  Sandford CrazeLEMENTS,Briscoe Daniello  Subjective/Objective Assessment:   29 yo admitted with Enteritis     Action/Plan:   Pt from [facility]   Anticipated DC Date:  01/21/2014   Anticipated DC Plan:  GROUP HOME  In-house referral  Clinical Social Worker      DC Planning Services  CM consult      Choice offered to / List presented to:             Status of service:  In process, will continue to follow Medicare Important Message given?   (If response is "NO", the following Medicare IM given date fields will be blank) Date Medicare IM given:   Medicare IM given by:   Date Additional Medicare IM given:   Additional Medicare IM given by:    Discharge Disposition:    Per UR Regulation:  Reviewed for med. necessity/level of care/duration of stay  If discussed at Long Length of Stay Meetings, dates discussed:    Comments:  01/19/14 Sandford CrazeNora Tyri Elmore RN,BSN,NCM Chart reviewed and CM following for DC needs.

## 2014-01-19 NOTE — Progress Notes (Signed)
INITIAL NUTRITION ASSESSMENT  DOCUMENTATION CODES Per approved criteria  -Not Applicable   INTERVENTION: - Spoke with RN from group home for feeding tips.  - Recommend continuing pt's usual pureed diet with honey-thickened liquids. Pt must be fed.  - Pt must sit up close to 90 degrees to eat per RN from home.  - Pt unable to drink from straw, needs to be spoon-fed or the cup put up to her mouth.  - Once diet upgraded, may call group home and have someone sent over to feed her.  - Recommend Magic cup TID with meals, each supplement provides 290 kcal and 9 grams of protein cup - Recommend Ensure Pudding po TID, each supplement provides 170 kcal and 4 grams of protein - RD will continue to monitor.   NUTRITION DIAGNOSIS: Inadequate oral intake related to inability to eat as evidenced by NPO.   Goal: Pt to meet >/= 90% of their estimated nutrition needs   Monitor:  Weight trend, po intake, acceptance of supplements, labs  Reason for Assessment: MST  29 y.o. female  Admitting Dx: <principal problem not specified>  ASSESSMENT: This is a 29 y.o. year old female with significant past medical history of moderate to severe MR, spastic quadriplegia, scoliosis, chronic constipation presenting with enteritis. Pt is resident of local group home. Per report, pt had multiple episodes of NBNB emesis at center. This has happened in the past and has been associated w/ severe constipation.  - Pt currently NPO. Spoke with RN from group home to obtain nutritional history. Pt is normally on a pureed diet with honey-thickened liquids. RN said that someone can be sent over to feed her once her diet is advanced. Pt must be fed and cannot drink from a straw. She normally takes benecalorie and resource supplements. Will send nutritional supplements to add extra calories and protein once diet advanced. Pt also needs to sit up as straight as possible when fed. Relayed this information to RN. - Per RN, pt had a  large bowel movement today. She is non-verbal at baseline.   Labs: Na and K WNL BUN WNL Albumin low  Height: Ht Readings from Last 1 Encounters:  No data found for Ht    Weight: Wt Readings from Last 1 Encounters:  01/17/14 108 lb 14.5 oz (49.4 kg)    Wt Readings from Last 10 Encounters:  01/17/14 108 lb 14.5 oz (49.4 kg)    Usual Body Weight: 95-100 lbs  % Usual Body Weight: 108%  BMI:  There is no height on file to calculate BMI.  Estimated Nutritional Needs: Kcal: 1600-1800 Protein: 95-105 g Fluid: 1.8 L/day  Skin: intact  Diet Order: Diet NPO time specified  EDUCATION NEEDS: -No education needs identified at this time  No intake or output data in the 24 hours ending 01/19/14 1440  Last BM: 01/19/13   Labs:   Recent Labs Lab 01/17/14 1824 01/18/14 0435 01/19/14 0650  NA 143 141 141  K 3.6 3.6 3.7  CL 111 111 106  CO2 BUN 15 7 <5*  CREATININE 0.48* 0.43* 0.52  CALCIUM 9.3 8.0* 8.5  GLUCOSE 145* 67* 84    CBG (last 3)  No results for input(s): GLUCAP in the last 72 hours.  Scheduled Meds: . antiseptic oral rinse  7 mL Mouth Rinse q12n4p  . baclofen  20 mg Oral TID  . carbamazepine  600 mg Oral BID  . chlorhexidine  15 mL Mouth Rinse BID  .  ciprofloxacin  400 mg Intravenous Q12H  . heparin  5,000 Units Subcutaneous 3 times per day  . Linaclotide  145 mcg Oral Daily  . magnesium citrate  1 Bottle Oral Once  . metronidazole  500 mg Intravenous Q8H  . ondansetron  4 mg Oral 4 times per day   Or  . ondansetron (ZOFRAN) IV  4 mg Intravenous 4 times per day  . polyethylene glycol  17 g Oral BID  . [START ON 01/20/2014] scopolamine  1 patch Transdermal Q72H  . senna  1 tablet Oral BID  . Valproic Acid  500 mg Oral TID    Continuous Infusions: . sodium chloride 100 mL/hr at 01/18/14 0019    Past Medical History  Diagnosis Date  . Mental retardation   . Spastic quadriparesis   . Seizure disorder   . Scoliosis   . Static  encephalopathy   . Osteopenia   . Dysphagia   . Osteoporosis   . Constipation     History reviewed. No pertinent past surgical history.  Sheila KluverHaley Elisavet Buehrer MS, RD, LDN

## 2014-01-19 NOTE — Progress Notes (Signed)
[facility] PROGRESS NOTE  Sheila Costa ZOX:[id number] DOB: May 14, 1985 DOA: 01/17/2014 PCP: No primary care provider on file.  Assessment/Plan: 1. Constipation 1. Large amount of stool noted on imaging 2. Thus far 2 documented BM noted with linzess with soap suds enema 3. Cont to promote bm 2. Enteritis 1. On cipro and flagyl 3. 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 Stable 4. DVT prophylaxis 1. Heparin subQ  Code Status: Full Family Communication: Pt in room (indicate person spoken with, relationship, and if by phone, the number) Disposition Plan: Pending   Consultants:  none  Procedures:  none  Antibiotics:  cipro 1/4>>>  Flagyl 1/4>>>  HPI/Subjective: No acute events noted overnight. No BM since admission  Objective: Filed Vitals:   01/18/14 0525 01/18/14 1340 01/18/14 2212 01/19/14 1429  BP: 105/64 117/81 113/77 116/80  Pulse: 103 117 90 88  Temp: 97.4 F (36.3 C) 98.7 F (37.1 C) 98.2 F (36.8 C) 98.6 F (37 C)  TempSrc: Axillary Oral Oral Axillary  Resp: Weight:      SpO2: 94% 100% 99% 98%   No intake or output data in the 24 hours ending 01/19/14 1820 Filed Weights   01/17/14 2337  Weight: 49.4 kg (108 lb 14.5 oz)    Exam:   General:  Awake, in nad  Cardiovascular: regular, s1, s2  Respiratory: normal resp effort, no wheezing  Abdomen: mildly distended, decreased BS  Musculoskeletal: perfused, no clubbing   Data Reviewed: Basic Metabolic Panel:  Recent Labs Lab 01/17/14 1824 01/18/14 0435 01/19/14 0650  NA 143 141 141  K 3.6 3.6 3.7  CL 111 111 106  CO2 GLUCOSE 145* 67* 84  BUN 15 7 <5*  CREATININE 0.48* 0.43* 0.52  CALCIUM 9.3 8.0* 8.5   Liver Function Tests:  Recent Labs Lab 01/17/14 1824 01/18/14 0435  AST 28 19  ALT 11 8  ALKPHOS 45 37*  BILITOT 0.5 0.3  PROT 7.6 6.1  ALBUMIN 4.0 3.2*    Recent Labs Lab 01/17/14 1824  LIPASE 26   No results for input(s): AMMONIA in the last 168  hours. CBC:  Recent Labs Lab 01/17/14 1824 01/18/14 0435 01/19/14 0650  WBC 9.9 9.0 4.5  NEUTROABS 7.8* 4.6  --   HGB 12.9 11.1* 12.6  HCT 38.8 34.8* 39.2  MCV 80.7 80.9 80.8  PLT 216 164 162   Cardiac Enzymes: No results for input(s): CKTOTAL, CKMB, CKMBINDEX, TROPONINI in the last 168 hours. BNP (last 3 results) No results for input(s): PROBNP in the last 8760 hours. CBG: No results for input(s): GLUCAP in the last 168 hours.  Recent Results (from the past 240 hour(s))  Culture, Urine     Status: None (Preliminary result)   Collection Time: 01/17/14  6:35 PM  Result Value Ref Range Status   Specimen Description URINE, RANDOM  Final   Special Requests NONE  Final   Colony Count   Final    25,000 COLONIES/ML Performed at [facility]    Culture   Final    ESCHERICHIA COLI Performed at [facility]    Report Status PENDING  Incomplete  Blood culture (routine x 2)     Status: None (Preliminary result)   Collection Time: 01/17/14  7:09 PM  Result Value Ref Range Status   Specimen Description BLOOD LEFT HAND  Final   Special Requests BOTTLES DRAWN AEROBIC AND ANAEROBIC 3CC  Final   Culture   Final  BLOOD CULTURE RECEIVED NO GROWTH TO DATE CULTURE WILL BE HELD FOR 5 DAYS BEFORE ISSUING A FINAL NEGATIVE REPORT [facility]    Report Status PENDING  Incomplete  Blood culture (routine x 2)     Status: None (Preliminary result)   Collection Time: 01/17/14  7:10 PM  Result Value Ref Range Status   Specimen Description BLOOD RIGHT HAND  Final   Special Requests BOTTLES DRAWN AEROBIC ONLY 3CC  Final   Culture   Final           BLOOD CULTURE RECEIVED NO GROWTH TO DATE CULTURE WILL BE HELD FOR 5 DAYS BEFORE ISSUING A FINAL NEGATIVE REPORT Performed at [facility]    Report Status PENDING  Incomplete     Studies: Dg Chest 2 View  01/18/2014   CLINICAL DATA:  Enteritis.  EXAM: CHEST  2 VIEW  COMPARISON:  January 17, 2014.  FINDINGS: Hypoventilation of the lungs is noted. Status post extensive surgical fusion of the thoracic and lumbar spine. Left lung is clear. Increased opacity is noted in right lung base most consistent with subsegmental atelectasis given the previously described hypoventilation. No pneumothorax or significant pleural effusion is noted. Stable cardiomediastinal silhouette.  IMPRESSION: Hypoinflation of the lungs is noted with right lower lobe opacity most consistent with subsegmental atelectasis.   Electronically Signed   By: Roque LiasJames  Green M.D.   On: 01/18/2014 09:31   Ct Abdomen Pelvis W Contrast  01/17/2014   CLINICAL DATA:  Vomiting and abdominal pain.  EXAM: CT ABDOMEN AND PELVIS WITH CONTRAST  TECHNIQUE: Multidetector CT imaging of the abdomen and pelvis was performed using the standard protocol following bolus administration of intravenous contrast.  CONTRAST:  50mL OMNIPAQUE IOHEXOL 300 MG/ML SOLN, 80mL OMNIPAQUE IOHEXOL 300 MG/ML SOLN  COMPARISON:  Radiographs earlier the same day.  FINDINGS: No pleural effusion or parenchymal consolidation in the lung bases. Minimal atelectasis in the dependent left lower lobe. There is pectus deformity of the lower thoracic cage related to scoliotic curvature of the spine.  Streak artifact from spinal fusion hardware. There is some oral contrast within the stomach, no gastric distention is seen. Bowel evaluation is limited due to lack contrast and paucity of intra-abdominal fat. No small bowel dilatation. There are fluid-filled but normal caliber bowel loops in the right lower abdomen. Small volume of stool is seen in the ascending and transverse colon. Moderate stool is seen in the rectosigmoid colon. There is minimal soft tissue thickening in the region of the distal sigmoid colon and rectum the can be seen with chronic constipation. The cecum appears high riding, the appendix tentatively identified in the right upper quadrant of the abdomen and appears normal.   No focal hepatic lesion. The gallbladder is physiologically distended. Spleen and pancreas are unremarkable. The adrenal glands tentatively identified and normal. There is symmetric renal enhancement. No hydronephrosis. Retroperitoneum is obscured by streak artifact. There is no definite free air, free fluid, or intra-abdominal fluid collection.  Bladder is physiologically distended, question wall thickening involving the dome. Uterus appears normal in size. Neither ovary is discretely seen, no gross adnexal mass. No pelvic free fluid.  Posterior spinal fusion throughout the entire included spine. The bones appear under mineralized. No acute osseous abnormality.  IMPRESSION: 1. Fluid-filled small bowel loops in the right abdomen, may reflect enteritis. No bowel dilatation to suggest obstruction. Soft tissue thickening in the rectosigmoid colon, can be seen with chronic constipation, moderate volume of stool is seen  in the sigmoid colon at this time. Detailed bowel evaluation is limited. 2. Question bladder wall thickening involving the dome. Correlation with urinalysis recommended to exclude urinary tract infection. 3. Extensive posterior fusion throughout the spine.   Electronically Signed   By: Rubye Oaks M.D.   On: 01/17/2014 21:29   Dg Abd Acute W/chest  01/17/2014   CLINICAL DATA:  Vomiting.  EXAM: ACUTE ABDOMEN SERIES (ABDOMEN 2 VIEW & CHEST 1 VIEW)  COMPARISON:  None.  FINDINGS: Low lung volumes. Crowding of bronchovascular structures likely related to low lung volumes. No confluent airspace disease to suggest pneumonia. The cardiomediastinal contours are normal for technique.  There is no free intra-abdominal air. There is a generalized paucity of intra-abdominal bowel gas. No dilated bowel loops to suggest obstruction. Small volume of stool and air is seen in the rectosigmoid colon. There are no radiopaque calculi. There is extensive posterior spinal fusion throughout the entire thoracolumbar spine  and sacrum.  IMPRESSION: 1. Paucity of intra-abdominal bowel gas. No dilated bowel loops to suggest obstruction. No free air. 2. Hypoventilatory chest.   Electronically Signed   By: Rubye Oaks M.D.   On: 01/17/2014 19:53    Scheduled Meds: . antiseptic oral rinse  7 mL Mouth Rinse q12n4p  . baclofen  20 mg Oral TID  . carbamazepine  600 mg Oral BID  . chlorhexidine  15 mL Mouth Rinse BID  . ciprofloxacin  400 mg Intravenous Q12H  . heparin  5,000 Units Subcutaneous 3 times per day  . Linaclotide  145 mcg Oral Daily  . magnesium citrate  1 Bottle Oral Once  . metronidazole  500 mg Intravenous Q8H  . ondansetron  4 mg Oral 4 times per day   Or  . ondansetron (ZOFRAN) IV  4 mg Intravenous 4 times per day  . polyethylene glycol  17 g Oral BID  . [START ON 01/20/2014] scopolamine  1 patch Transdermal Q72H  . senna  1 tablet Oral BID  . Valproic Acid  500 mg Oral TID   Continuous Infusions: . sodium chloride 100 mL/hr at 01/18/14 0019    Active Problems:   Enteritis   Possible UTI (lower urinary tract infection)  Time spent:  Devine Klingel K  [facility] Pager [phone]. If 7PM-7AM, please contact night-coverage at [email or web address], password [facility] 01/19/2014, 6:20 PM  LOS: 2 days

## 2014-01-20 ENCOUNTER — Inpatient Hospital Stay (HOSPITAL_COMMUNITY): Payer: Medicare Other

## 2014-01-20 DIAGNOSIS — K5900 Constipation, unspecified: Secondary | ICD-10-CM

## 2014-01-20 DIAGNOSIS — F79 Unspecified intellectual disabilities: Secondary | ICD-10-CM

## 2014-01-20 LAB — URINE CULTURE: Colony Count: 25000

## 2014-01-20 MED ORDER — ONDANSETRON HCL 4 MG/2ML IJ SOLN: 4.0000 mg | Freq: Four times a day (QID) | INTRAMUSCULAR | Status: DC | PRN

## 2014-01-20 MED ORDER — SACCHAROMYCES BOULARDII 250 MG PO CAPS
250.0000 mg | ORAL_CAPSULE | Freq: Two times a day (BID) | ORAL | Status: DC
  Administered 2014-01-20 – 2014-01-21 (×3): 250 mg via ORAL
  Filled 2014-01-20 (×4): qty 1

## 2014-01-20 MED ORDER — ONDANSETRON HCL 4 MG PO TABS: 4.0000 mg | ORAL_TABLET | Freq: Four times a day (QID) | ORAL | Status: DC | PRN

## 2014-01-20 MED ORDER — METRONIDAZOLE 500 MG PO TABS
500.0000 mg | ORAL_TABLET | Freq: Three times a day (TID) | ORAL | Status: DC
  Administered 2014-01-20 – 2014-01-21 (×3): 500 mg via ORAL
  Filled 2014-01-20 (×6): qty 1

## 2014-01-20 MED ORDER — CIPROFLOXACIN HCL 500 MG PO TABS
500.0000 mg | ORAL_TABLET | Freq: Two times a day (BID) | ORAL | Status: DC
  Administered 2014-01-20 – 2014-01-21 (×2): 500 mg via ORAL
  Filled 2014-01-20 (×4): qty 1

## 2014-01-20 NOTE — Progress Notes (Signed)
[facility] PROGRESS NOTE  Sheila Costa NFA:[id number] DOB: [birth year] DOA: 01/17/2014 PCP: No primary care provider on file.  Assessment/Plan: 1. Constipation 1. Large amount of stool noted on imaging 2. Multiple large bowel movements with cathartics and linzess 3. Advance diet as tolerated 4. Cont to promote bm 2. Questionable Enteritis 1. On empiric cipro and flagyl 2. Pt without leukocytosis and is afebrile 3. Question if findings on imaging are more related to chronic constipation and NOT primary infection 3. 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 Stable 4. DVT prophylaxis 1. Heparin subQ 5. Bacturia  1. UA is not suggestive of active UTI 2. Afebrile 3. Will observe for now  Code Status: Full Family Communication: Pt in room Disposition Plan: Pending   Consultants:  none  Procedures:  none  Antibiotics:  cipro 1/4>>>  Flagyl 1/4>>>  HPI/Subjective: Large multiple BM noted overnight. No acute events noted  Objective: Filed Vitals:   01/19/14 1429 01/19/14 2143 01/20/14 0508 01/20/14 1420  BP: 116/80 125/88 104/54 122/80  Pulse: 88 80 62 86  Temp: 98.6 F (37 C) 98.8 F (37.1 C) 97.9 F (36.6 C) 98.2 F (36.8 C)  TempSrc: Axillary Axillary Axillary Axillary  Resp: Weight:      SpO2: 98% 93% 90% 94%    Intake/Output Summary (Last 24 hours) at 01/20/14 1603 Last data filed at 01/20/14 1222  Gross per 24 hour  Intake  771.5 ml  Output      0 ml  Net  771.5 ml   Filed Weights   01/17/14 2337  Weight: 49.4 kg (108 lb 14.5 oz)    Exam:   General:  Awake, in nad  Cardiovascular: regular, s1, s2  Respiratory: normal resp effort, no wheezing  Abdomen: mildly distended, decreased BS  Musculoskeletal: perfused, no clubbing   Data Reviewed: Basic Metabolic Panel:  Recent Labs Lab 01/17/14 1824 01/18/14 0435 01/19/14 0650  NA 143 141 141  K 3.6 3.6 3.7  CL 111 111 106  CO2 GLUCOSE 145* 67* 84  BUN 15 7 <5*   CREATININE 0.48* 0.43* 0.52  CALCIUM 9.3 8.0* 8.5   Liver Function Tests:  Recent Labs Lab 01/17/14 1824 01/18/14 0435  AST 28 19  ALT 11 8  ALKPHOS 45 37*  BILITOT 0.5 0.3  PROT 7.6 6.1  ALBUMIN 4.0 3.2*    Recent Labs Lab 01/17/14 1824  LIPASE 26   No results for input(s): AMMONIA in the last 168 hours. CBC:  Recent Labs Lab 01/17/14 1824 01/18/14 0435 01/19/14 0650  WBC 9.9 9.0 4.5  NEUTROABS 7.8* 4.6  --   HGB 12.9 11.1* 12.6  HCT 38.8 34.8* 39.2  MCV 80.7 80.9 80.8  PLT 216 164 162   Cardiac Enzymes: No results for input(s): CKTOTAL, CKMB, CKMBINDEX, TROPONINI in the last 168 hours. BNP (last 3 results) No results for input(s): PROBNP in the last 8760 hours. CBG: No results for input(s): GLUCAP in the last 168 hours.  Recent Results (from the past 240 hour(s))  Culture, Urine     Status: None   Collection Time: 01/17/14  6:35 PM  Result Value Ref Range Status   Specimen Description URINE, RANDOM  Final   Special Requests NONE  Final   Colony Count   Final    25,000 COLONIES/ML Performed at [facility]    Culture   Final    ESCHERICHIA COLI Performed at [facility]    Report Status 01/20/2014 FINAL  Final   Organism ID, Bacteria ESCHERICHIA COLI  Final      Susceptibility   Escherichia coli - MIC*    AMPICILLIN 8 SENSITIVE Sensitive     CEFAZOLIN <=4 SENSITIVE Sensitive     CEFTRIAXONE <=1 SENSITIVE Sensitive     CIPROFLOXACIN >=4 RESISTANT Resistant     GENTAMICIN <=1 SENSITIVE Sensitive     LEVOFLOXACIN >=8 RESISTANT Resistant     NITROFURANTOIN <=16 SENSITIVE Sensitive     TOBRAMYCIN <=1 SENSITIVE Sensitive     TRIMETH/SULFA <=20 SENSITIVE Sensitive     PIP/TAZO <=4 SENSITIVE Sensitive     * ESCHERICHIA COLI  Blood culture (routine x 2)     Status: None (Preliminary result)   Collection Time: 01/17/14  7:09 PM  Result Value Ref Range Status   Specimen Description BLOOD LEFT HAND  Final   Special Requests BOTTLES  DRAWN AEROBIC AND ANAEROBIC 3CC  Final   Culture   Final           BLOOD CULTURE RECEIVED NO GROWTH TO DATE CULTURE WILL BE HELD FOR 5 DAYS BEFORE ISSUING A FINAL NEGATIVE REPORT [facility]formed at [facility]    Report Status PENDING  Incomplete  Blood culture (routine x 2)     Status: None (Preliminary result)   Collection Time: 01/17/14  7:10 PM  Result Value Ref Range Status   Specimen Description BLOOD RIGHT HAND  Final   Special Requests BOTTLES DRAWN AEROBIC ONLY 3CC  Final   Culture   Final           BLOOD CULTURE RECEIVED NO GROWTH TO DATE CULTURE WILL BE HELD FOR 5 DAYS BEFORE ISSUING A FINAL NEGATIVE REPORT Performed at [facility]    Report Status PENDING  Incomplete     Studies: No results found.  Scheduled Meds: . antiseptic oral rinse  7 mL Mouth Rinse q12n4p  . baclofen  20 mg Oral TID  . carbamazepine  600 mg Oral BID  . chlorhexidine  15 mL Mouth Rinse BID  . ciprofloxacin  500 mg Oral BID  . heparin  5,000 Units Subcutaneous 3 times per day  . Linaclotide  145 mcg Oral Daily  . magnesium citrate  1 Bottle Oral Once  . metroNIDAZOLE  500 mg Oral 3 times per day  . polyethylene glycol  17 g Oral BID  . saccharomyces boulardii  250 mg Oral BID  . scopolamine  1 patch Transdermal Q72H  . senna  1 tablet Oral BID  . Valproic Acid  500 mg Oral TID   Continuous Infusions: . sodium chloride 100 mL/hr at 01/20/14 0542    Active Problems:   Enteritis   Possible UTI (lower urinary tract infection)  Time spent: 30min  CHIU, STEPHEN K  [facility] Pager [phone]. If 7PM-7AM, please contact night-coverage at [email or web address], password [email or web address] 01/20/2014, 4:03 PM  LOS: 3 days

## 2014-01-20 NOTE — Progress Notes (Signed)
CSW continuing to follow.  CSW spoke with MD and RN and pt diet is being advanced and pt transitioning over to oral antibiotic and anticipation is for discharge back [facility] tomorrow.  CSW updated [facility] RN, [place].   CSW to continue to follow.  Loletta SpecterSuzanna Jakari Jacot, MSW, LCSW Clinical Social Work [phone]

## 2014-01-21 ENCOUNTER — Encounter (HOSPITAL_COMMUNITY): Payer: Self-pay | Admitting: Emergency Medicine

## 2014-01-21 LAB — BASIC METABOLIC PANEL
Anion gap: 10 (ref 5–15)
CHLORIDE: 105 meq/L (ref 96–112)
CO2: 25 mmol/L (ref 19–32)
Calcium: 8.5 mg/dL (ref 8.4–10.5)
Creatinine, Ser: 0.45 mg/dL — ABNORMAL LOW (ref 0.50–1.10)
GFR calc Af Amer: >90 mL/min (ref >90)
Glucose, Bld: 93 mg/dL (ref 70–99)
POTASSIUM: 3 mmol/L — AB (ref 3.5–5.1)
SODIUM: 140 mmol/L (ref 135–145)

## 2014-01-21 LAB — CBC
HCT: 37.9 % (ref 36.0–46.0)
Hemoglobin: 12.6 g/dL (ref 12.0–15.0)
MCH: 26.1 pg (ref 26.0–34.0)
MCHC: 33.2 g/dL (ref 30.0–36.0)
MCV: 78.6 fL (ref 78.0–100.0)
PLATELETS: 185 10*3/uL (ref 150–400)
RBC: 4.82 MIL/uL (ref 3.87–5.11)
RDW: 13.3 % (ref 11.5–15.5)
WBC: 4.7 10*3/uL (ref 4.0–10.5)

## 2014-01-21 MED ORDER — ENSURE PUDDING PO PUDG
1.0000 | Freq: Three times a day (TID) | ORAL | Status: DC
  Filled 2014-01-21 (×3): qty 1

## 2014-01-21 MED ORDER — METRONIDAZOLE 500 MG PO TABS: 500.0000 mg | ORAL_TABLET | Freq: Three times a day (TID) | ORAL | Status: DC

## 2014-01-21 MED ORDER — POTASSIUM CHLORIDE CRYS ER 20 MEQ PO TBCR
40.0000 meq | EXTENDED_RELEASE_TABLET | Freq: Two times a day (BID) | ORAL | Status: DC
  Administered 2014-01-21: 40 meq via ORAL
  Filled 2014-01-21 (×2): qty 2

## 2014-01-21 MED ORDER — CIPROFLOXACIN HCL 500 MG PO TABS: 500.0000 mg | ORAL_TABLET | Freq: Two times a day (BID) | ORAL | Status: DC

## 2014-01-21 NOTE — Progress Notes (Addendum)
Pt for discharge back to [facility] today.   CSW faxed pt discharge information to [facility] and spoke with RN, Judeth CornfieldStephanie who will review discharge information and notify this CSW when [facility] can come to [facility] pt back to group home.   CSW contacted pt legal guardian, Selinda FlavinJackie Richardson via telephone and notified pt guardian that pt returning [facility] today. Pt guardian pleased that pt will be able to return today.  CSW provided pt discharge packet to pt RN and awaiting pt group home to notify CSW when transport will come to transport pt back to [facility].  Addendum 12:30 pm:  CSW received notification from [facility] that facility was sending someone to pick up pt to transport pt back to group home.   CSW notified RN and discharge packet provided at bedside.   No further social work needs identified at this time.  CSW signing off.   Loletta SpecterSuzanna Dastan Krider, MSW, LCSW Clinical Social Work [phone]

## 2014-01-21 NOTE — Discharge Summary (Signed)
Physician Discharge Summary  Sheila Costa BJY:[id number] DOB: [birth year] DOA: 01/17/2014  PCP: No primary care provider on file.  Admit date: 01/17/2014 Discharge date: 01/21/2014  Time spent: 30 minutes  Recommendations for Outpatient Follow-up:  1. Follow up with PCP in 1-2 weeks 2. Please ensure daily bowel movements 3. If need be, would consider addition of Amitiza or Linzess for treatment of chronic constipation  Discharge Diagnoses:  Active Problems:   Enteritis   Possible UTI (lower urinary tract infection)   Mental retardation   Constipated   Discharge Condition: improved  Diet recommendation: regular  Filed Weights   01/17/14 2337  Weight: 49.4 kg (108 lb 14.5 oz)    History of present illness:  Please see admit h and p from 01/17/14 for details. Briefly, pt presents with multiple bouts of emesis in the setting of chronic constipation. The patient was found to have imaging findings suggestive of enteritis. She was admitted for further work up.  Hospital Course:  1. Constipation 1. Large amount of stool noted on imaging 2. Multiple large bowel movements with cathartics and linzess 3. Successfully advanced diet 4. Would continue to promote bm 2. Questionable Enteritis 1. On empiric cipro and flagyl 2. Pt without leukocytosis and remained afebrile 3. Question if findings on imaging are more related to chronic constipation and NOT primary infection 4. Would complete empiric abx as per above 3. 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 Stable 4. DVT prophylaxis 1. Heparin subQ 5. Bacturia  1. UA is not suggestive of active UTI 2. Remains afebrile 3. Will observe for now  Consultations:  none  Discharge Exam: Filed Vitals:   01/20/14 0508 01/20/14 1420 01/20/14 2205 01/21/14 0524  BP: 104/54 122/80 137/68 106/64  Pulse: 62 86 84 76  Temp: 97.9 F (36.6 C) 98.2 F (36.8 C) 98.6 F (37 C) 98.5 F (36.9 C)  TempSrc: Axillary Axillary Axillary Axillary  Resp: Weight:      SpO2: 90% 94% 92% 94%    General: awake, in nad Cardiovascular: regular, s1, s2 Respiratory: normal resp effort, no wheezing  Discharge Instructions     Medication List    TAKE these medications        baclofen 20 MG tablet  Commonly known as:  LIORESAL  Take 20 mg by mouth 3 (three) times daily.     calcium-vitamin D 500-200 MG-UNIT per tablet  Commonly known as:  OSCAL WITH D  Take 1 tablet by mouth.     cholecalciferol 1000 UNITS tablet  Commonly known as:  VITAMIN D  Take 2,000 Units by mouth daily.     ciprofloxacin 500 MG tablet  Commonly known as:  CIPRO  Take 1 tablet (500 mg total) by mouth 2 (two) times daily.     EQUETRO 300 MG Cp12  Generic drug:  Carbamazepine  Take 600 mg by mouth 2 (two) times daily.     ibuprofen 600 MG tablet  Commonly known as:  ADVIL,MOTRIN  Take 600 mg by mouth every 8 (eight) hours as needed for moderate pain.     LIP BALM EX  Apply 1 application topically as needed (dry lips).     loratadine 10 MG tablet  Commonly known as:  CLARITIN  Take 10 mg by mouth daily.     magnesium oxide 400 MG tablet  Commonly known as:  MAG-OX  Take 400 mg by mouth 3 (three) times daily.     metroNIDAZOLE 500 MG tablet  Commonly known as:  FLAGYL  Take 1 tablet (500 mg total) by mouth every 8 (eight) hours.     ondansetron 4 MG disintegrating tablet  Commonly known as:  ZOFRAN-ODT  Take 4 mg by mouth every 8 (eight) hours as needed for nausea or vomiting.     polyethylene glycol packet  Commonly known as:  MIRALAX / GLYCOLAX  Take 17 g by mouth 2 (two) times daily.     RISA-BID PROBIOTIC PO  Take 1 capsule by mouth 2 (two) times daily.     scopolamine 1 MG/3DAYS  Commonly known as:  TRANSDERM-SCOP  Place 1 patch onto the skin every 3 (three) days.     senna 8.6 MG Tabs tablet  Commonly known as:  SENOKOT  Take 1 tablet by mouth 2 (two) times daily.     sodium phosphate enema  Commonly known as:  FLEET   Place 1 enema rectally once. follow package directions     sorbitol 70 % solution  Take 15 mLs by mouth at bedtime.     Valproic Acid 250 MG/5ML Syrp syrup  Commonly known as:  DEPAKENE  Take 500 mg by mouth 3 (three) times daily.       No Known Allergies Follow-up Information    Follow up with Follow up with PCP in 1-2 weeks.       The results of significant diagnostics from this hospitalization (including imaging, microbiology, ancillary and laboratory) are listed below for reference.    Significant Diagnostic Studies: Dg Chest 2 View  01/18/2014   CLINICAL DATA:  Enteritis.  EXAM: CHEST  2 VIEW  COMPARISON:  January 17, 2014.  FINDINGS: Hypoventilation of the lungs is noted. Status post extensive surgical fusion of the thoracic and lumbar spine. Left lung is clear. Increased opacity is noted in right lung base most consistent with subsegmental atelectasis given the previously described hypoventilation. No pneumothorax or significant pleural effusion is noted. Stable cardiomediastinal silhouette.  IMPRESSION: Hypoinflation of the lungs is noted with right lower lobe opacity most consistent with subsegmental atelectasis.   Electronically Signed   By: Roque LiasJames  Green M.D.   On: 01/18/2014 09:31   Ct Abdomen Pelvis W Contrast  01/17/2014   CLINICAL DATA:  Vomiting and abdominal pain.  EXAM: CT ABDOMEN AND PELVIS WITH CONTRAST  TECHNIQUE: Multidetector CT imaging of the abdomen and pelvis was performed using the standard protocol following bolus administration of intravenous contrast.  CONTRAST:  50mL OMNIPAQUE IOHEXOL 300 MG/ML SOLN, 80mL OMNIPAQUE IOHEXOL 300 MG/ML SOLN  COMPARISON:  Radiographs earlier the same day.  FINDINGS: No pleural effusion or parenchymal consolidation in the lung bases. Minimal atelectasis in the dependent left lower lobe. There is pectus deformity of the lower thoracic cage related to scoliotic curvature of the spine.  Streak artifact from spinal fusion hardware. There  is some oral contrast within the stomach, no gastric distention is seen. Bowel evaluation is limited due to lack contrast and paucity of intra-abdominal fat. No small bowel dilatation. There are fluid-filled but normal caliber bowel loops in the right lower abdomen. Small volume of stool is seen in the ascending and transverse colon. Moderate stool is seen in the rectosigmoid colon. There is minimal soft tissue thickening in the region of the distal sigmoid colon and rectum the can be seen with chronic constipation. The cecum appears high riding, the appendix tentatively identified in the right upper quadrant of the abdomen and appears normal.  No focal hepatic lesion. The gallbladder is physiologically distended. Spleen and pancreas  are unremarkable. The adrenal glands tentatively identified and normal. There is symmetric renal enhancement. No hydronephrosis. Retroperitoneum is obscured by streak artifact. There is no definite free air, free fluid, or intra-abdominal fluid collection.  Bladder is physiologically distended, question wall thickening involving the dome. Uterus appears normal in size. Neither ovary is discretely seen, no gross adnexal mass. No pelvic free fluid.  Posterior spinal fusion throughout the entire included spine. The bones appear under mineralized. No acute osseous abnormality.  IMPRESSION: 1. Fluid-filled small bowel loops in the right abdomen, may reflect enteritis. No bowel dilatation to suggest obstruction. Soft tissue thickening in the rectosigmoid colon, can be seen with chronic constipation, moderate volume of stool is seen in the sigmoid colon at this time. Detailed bowel evaluation is limited. 2. Question bladder wall thickening involving the dome. Correlation with urinalysis recommended to exclude urinary tract infection. 3. Extensive posterior fusion throughout the spine.   Electronically Signed   By: Rubye Oaks M.D.   On: 01/17/2014 21:29   Dg Abd Acute  W/chest  01/17/2014   CLINICAL DATA:  Vomiting.  EXAM: ACUTE ABDOMEN SERIES (ABDOMEN 2 VIEW & CHEST 1 VIEW)  COMPARISON:  None.  FINDINGS: Low lung volumes. Crowding of bronchovascular structures likely related to low lung volumes. No confluent airspace disease to suggest pneumonia. The cardiomediastinal contours are normal for technique.  There is no free intra-abdominal air. There is a generalized paucity of intra-abdominal bowel gas. No dilated bowel loops to suggest obstruction. Small volume of stool and air is seen in the rectosigmoid colon. There are no radiopaque calculi. There is extensive posterior spinal fusion throughout the entire thoracolumbar spine and sacrum.  IMPRESSION: 1. Paucity of intra-abdominal bowel gas. No dilated bowel loops to suggest obstruction. No free air. 2. Hypoventilatory chest.   Electronically Signed   By: Rubye Oaks M.D.   On: 01/17/2014 19:53   Dg Abd Portable 1v  01/20/2014   CLINICAL DATA:  Constipation  EXAM: PORTABLE ABDOMEN - 1 VIEW  COMPARISON:  01/17/2014  FINDINGS: Scattered large and small bowel gas is noted. No obstructive changes are seen. Mild fecal material is noted within the colon. Postsurgical changes are noted extending from the thoracic spine into the pelvis. No free air is seen.  IMPRESSION: No acute abnormality noted.   Electronically Signed   By: Alcide Clever M.D.   On: 01/20/2014 16:50    Microbiology: Recent Results (from the past 240 hour(s))  Culture, Urine     Status: None   Collection Time: 01/17/14  6:35 PM  Result Value Ref Range Status   Specimen Description URINE, RANDOM  Final   Special Requests NONE  Final   Colony Count   Final    25,000 COLONIES/ML Performed at [facility]    Culture   Final    ESCHERICHIA COLI Performed at [facility]    Report Status 01/20/2014 FINAL  Final   Organism ID, Bacteria ESCHERICHIA COLI  Final      Susceptibility   Escherichia coli - MIC*    AMPICILLIN 8 SENSITIVE  Sensitive     CEFAZOLIN <=4 SENSITIVE Sensitive     CEFTRIAXONE <=1 SENSITIVE Sensitive     CIPROFLOXACIN >=4 RESISTANT Resistant     GENTAMICIN <=1 SENSITIVE Sensitive     LEVOFLOXACIN >=8 RESISTANT Resistant     NITROFURANTOIN <=16 SENSITIVE Sensitive     TOBRAMYCIN <=1 SENSITIVE Sensitive     TRIMETH/SULFA <=20 SENSITIVE Sensitive     PIP/TAZO <=4  SENSITIVE Sensitive     * ESCHERICHIA COLI  Blood culture (routine x 2)     Status: None (Preliminary result)   Collection Time: 01/17/14  7:09 PM  Result Value Ref Range Status   Specimen Description BLOOD LEFT HAND  Final   Special Requests BOTTLES DRAWN AEROBIC AND ANAEROBIC 3CC  Final   Culture   Final           BLOOD CULTURE RECEIVED NO GROWTH TO DATE CULTURE WILL BE HELD FOR 5 DAYS BEFORE ISSUING A FINAL NEGATIVE REPORT Performed at [facility]    Report Status PENDING  Incomplete  Blood culture (routine x 2)     Status: None (Preliminary result)   Collection Time: 01/17/14  7:10 PM  Result Value Ref Range Status   Specimen Description BLOOD RIGHT HAND  Final   Special Requests BOTTLES DRAWN AEROBIC ONLY 3CC  Final   Culture   Final           BLOOD CULTURE RECEIVED NO GROWTH TO DATE CULTURE WILL BE HELD FOR 5 DAYS BEFORE ISSUING A FINAL NEGATIVE REPORT Performed at [facility]    Report Status PENDING  Incomplete     Labs: Basic Metabolic Panel:  Recent Labs Lab 01/17/14 1824 01/18/14 0435 01/19/14 0650 01/21/14 0520  NA 143 141 141 140  K 3.6 3.6 3.7 3.0*  CL 111 111 106 105  CO2 GLUCOSE 145* 67* 84 93  BUN 15 7 <5* <5*  CREATININE 0.48* 0.43* 0.52 0.45*  CALCIUM 9.3 8.0* 8.5 8.5   Liver Function Tests:  Recent Labs Lab 01/17/14 1824 01/18/14 0435  AST 28 19  ALT 11 8  ALKPHOS 45 37*  BILITOT 0.5 0.3  PROT 7.6 6.1  ALBUMIN 4.0 3.2*    Recent Labs Lab 01/17/14 1824  LIPASE 26   No results for input(s): AMMONIA in the last 168 hours. CBC:  Recent Labs Lab  01/17/14 1824 01/18/14 0435 01/19/14 0650 01/21/14 0520  WBC 9.9 9.0 4.5 4.7  NEUTROABS 7.8* 4.6  --   --   HGB 12.9 11.1* 12.6 12.6  HCT 38.8 34.8* 39.2 37.9  MCV 80.7 80.9 80.8 78.6  PLT 216 164 162 185   Cardiac Enzymes: No results for input(s): CKTOTAL, CKMB, CKMBINDEX, TROPONINI in the last 168 hours. BNP: BNP (last 3 results) No results for input(s): PROBNP in the last 8760 hours. CBG: No results for input(s): GLUCAP in the last 168 hours.  Signed:  CHIU, STEPHEN K  [facility] 01/21/2014, 11:44 AM

## 2014-01-24 LAB — CULTURE, BLOOD (ROUTINE X 2)
CULTURE: NO GROWTH
Culture: NO GROWTH

## 2014-09-23 ENCOUNTER — Encounter (HOSPITAL_COMMUNITY): Payer: Self-pay | Admitting: Emergency Medicine

## 2014-09-23 ENCOUNTER — Emergency Department (HOSPITAL_COMMUNITY): Payer: Medicare Other

## 2014-09-23 ENCOUNTER — Emergency Department (HOSPITAL_COMMUNITY)
Admission: EM | Admit: 2014-09-23 | Discharge: 2014-09-24 | Disposition: A | Discharge status: Home Health | Payer: Medicare Other | Attending: Emergency Medicine | Admitting: Emergency Medicine

## 2014-09-23 DIAGNOSIS — Z79899 Other long term (current) drug therapy: Secondary | ICD-10-CM | POA: Insufficient documentation

## 2014-09-23 DIAGNOSIS — Z872 Personal history of diseases of the skin and subcutaneous tissue: Secondary | ICD-10-CM | POA: Insufficient documentation

## 2014-09-23 DIAGNOSIS — M810 Age-related osteoporosis without current pathological fracture: Secondary | ICD-10-CM | POA: Insufficient documentation

## 2014-09-23 DIAGNOSIS — F79 Unspecified intellectual disabilities: Secondary | ICD-10-CM | POA: Insufficient documentation

## 2014-09-23 DIAGNOSIS — R112 Nausea with vomiting, unspecified: Secondary | ICD-10-CM | POA: Diagnosis present

## 2014-09-23 DIAGNOSIS — Z3202 Encounter for pregnancy test, result negative: Secondary | ICD-10-CM | POA: Diagnosis not present

## 2014-09-23 DIAGNOSIS — K5900 Constipation, unspecified: Secondary | ICD-10-CM | POA: Diagnosis not present

## 2014-09-23 DIAGNOSIS — G40909 Epilepsy, unspecified, not intractable, without status epilepticus: Secondary | ICD-10-CM | POA: Diagnosis not present

## 2014-09-23 DIAGNOSIS — R5383 Other fatigue: Secondary | ICD-10-CM | POA: Insufficient documentation

## 2014-09-23 DIAGNOSIS — M8580 Other specified disorders of bone density and structure, unspecified site: Secondary | ICD-10-CM | POA: Insufficient documentation

## 2014-09-23 LAB — COMPREHENSIVE METABOLIC PANEL
ALT: 12 U/L — AB (ref 14–54)
AST: 26 U/L (ref 15–41)
Albumin: 4.2 g/dL (ref 3.5–5.0)
Alkaline Phosphatase: 42 U/L (ref 38–126)
Anion gap: 7 (ref 5–15)
BILIRUBIN TOTAL: 0.6 mg/dL (ref 0.3–1.2)
BUN: 15 mg/dL (ref 6–20)
CALCIUM: 9.2 mg/dL (ref 8.9–10.3)
CO2: 26 mmol/L (ref 22–32)
CREATININE: 0.53 mg/dL (ref 0.44–1.00)
Chloride: 109 mmol/L (ref 101–111)
GFR calc Af Amer: >60 mL/min (ref >60)
Glucose, Bld: 118 mg/dL — ABNORMAL HIGH (ref 65–99)
POTASSIUM: 3.9 mmol/L (ref 3.5–5.1)
Sodium: 142 mmol/L (ref 135–145)
TOTAL PROTEIN: 7.7 g/dL (ref 6.5–8.1)

## 2014-09-23 LAB — CBC WITH DIFFERENTIAL/PLATELET
BASOS ABS: 0 10*3/uL (ref 0.0–0.1)
Basophils Relative: 0 % (ref 0–1)
Eosinophils Absolute: 0 10*3/uL (ref 0.0–0.7)
Eosinophils Relative: 0 % (ref 0–5)
HEMATOCRIT: 39.5 % (ref 36.0–46.0)
Hemoglobin: 12.7 g/dL (ref 12.0–15.0)
LYMPHS PCT: 9 % — AB (ref 12–46)
Lymphs Abs: 1 10*3/uL (ref 0.7–4.0)
MCH: 25.9 pg — ABNORMAL LOW (ref 26.0–34.0)
MCHC: 32.2 g/dL (ref 30.0–36.0)
MCV: 80.6 fL (ref 78.0–100.0)
MONO ABS: 0.8 10*3/uL (ref 0.1–1.0)
Monocytes Relative: 7 % (ref 3–12)
NEUTROS ABS: 8.6 10*3/uL — AB (ref 1.7–7.7)
Neutrophils Relative %: 84 % — ABNORMAL HIGH (ref 43–77)
Platelets: 216 10*3/uL (ref 150–400)
RBC: 4.9 MIL/uL (ref 3.87–5.11)
RDW: 13.5 % (ref 11.5–15.5)
WBC: 10.3 10*3/uL (ref 4.0–10.5)

## 2014-09-23 LAB — LIPASE, BLOOD: LIPASE: 25 U/L (ref 22–51)

## 2014-09-23 MED ORDER — SODIUM CHLORIDE 0.9 % IV SOLN
1000.0000 mL | Freq: Once | INTRAVENOUS | Status: AC
  Administered 2014-09-23: 1000 mL via INTRAVENOUS

## 2014-09-23 MED ORDER — SODIUM CHLORIDE 0.9 % IV SOLN
1000.0000 mL | INTRAVENOUS | Status: DC
  Administered 2014-09-23: 1000 mL via INTRAVENOUS

## 2014-09-23 MED ORDER — ONDANSETRON HCL 4 MG/2ML IJ SOLN
4.0000 mg | Freq: Once | INTRAMUSCULAR | Status: AC
  Administered 2014-09-23: 4 mg via INTRAVENOUS
  Filled 2014-09-23: qty 2

## 2014-09-23 NOTE — ED Notes (Signed)
Warm blankets provided.

## 2014-09-23 NOTE — ED Notes (Signed)
Nurse currently starting IV 

## 2014-09-23 NOTE — ED Notes (Signed)
Bed: WA24 Expected date:  Expected time:  Means of arrival:  Comments: Tr 2 

## 2014-09-23 NOTE — ED Provider Notes (Signed)
CSN: [id number]     Arrival date & time 09/23/14  1618 History   First MD Initiated Contact with Patient 09/23/14 1801     Chief Complaint  Patient presents with  . Emesis  . Fatigue     (Consider location/radiation/quality/duration/timing/severity/associated sxs/prior Treatment) HPI Comments: Pt is a 29 yo female with history of mental retardation and seizures who presents to the ED via EMS from a group home with complaint of vomiting, onset this morning. Pt is nonverbal and with bowel and bladder incontinence at baseline. Pt's group home care giver reports that the pt has been unable to tolerate PO today and notes she had NBNB emesis x3. She notes the pt has not been able to keep her medications down. Care giver also reports that pt has appeared more fatigued today. Denies fever, dyspnea, diarrhea, hematochezia, rash.   Patient is a 29 y.o. female presenting with vomiting.  Emesis   Past Medical History  Diagnosis Date  . Seizures   . Encephalopathy   . Dysphagia   . Pressure ulcer 2008    right lower extremity  . Mental retardation   . Spastic quadriparesis   . Seizure disorder   . Scoliosis   . Static encephalopathy   . Osteopenia   . Dysphagia   . Osteoporosis   . Constipation    Past Surgical History  Procedure Laterality Date  . Hardware in back    . Back surgery     History reviewed. No pertinent family history. Social History  Substance Use Topics  . Smoking status: Never Smoker   . Smokeless tobacco: Never Used  . Alcohol Use: No   OB History    Gravida Para Term Preterm AB TAB SAB Ectopic Multiple Living   0 0 0 0 0 0 0 0       Review of Systems  Constitutional: Positive for fatigue.  Gastrointestinal: Positive for vomiting.  All other systems reviewed and are negative.     Allergies  Review of patient's allergies indicates no known allergies.  Home Medications   Prior to Admission medications   Medication Sig Start Date End Date Taking?  Authorizing Provider  acetaminophen (TYLENOL) 160 MG/5ML liquid Take 640 mg by mouth every 4 (four) hours as needed for pain.   Yes Historical Provider, MD  acetaminophen (TYLENOL) 325 MG tablet Take 650 mg by mouth every 4 (four) hours as needed for moderate pain.   Yes Historical Provider, MD  alum & mag hydroxide-simeth (MAALOX/MYLANTA) 200-200-20 MG/5ML suspension Take 15 mLs by mouth every 2 (two) hours as needed for indigestion or heartburn.   Yes Historical Provider, MD  baclofen (LIORESAL) 20 MG tablet Take 20 mg by mouth 3 (three) times daily.   Yes Historical Provider, MD  benzoyl peroxide 10 % gel Apply 1 application topically daily.   Yes Historical Provider, MD  bisacodyl (DULCOLAX) 10 MG suppository Place 10 mg rectally as needed for moderate constipation.   Yes Historical Provider, MD  calcium carbonate (TUMS - DOSED IN MG ELEMENTAL CALCIUM) 500 MG chewable tablet Chew 1 tablet by mouth daily.    Yes Historical Provider, MD  Carbamazepine (EQUETRO) 300 MG CP12 Take 600 mg by mouth 2 (two) times daily.   Yes Historical Provider, MD  chlorhexidine (PERIDEX) 0.12 % solution Use as directed 10 mLs in the mouth or throat daily.   Yes Historical Provider, MD  cholecalciferol (VITAMIN D) 1000 UNITS tablet Take 2,000 Units by mouth daily.   Yes Historical  Provider, MD  diphenhydrAMINE (BENADRYL) 25 mg capsule Take 25 mg by mouth every 4 (four) hours as needed for itching or allergies.   Yes Historical Provider, MD  guaifenesin (ROBITUSSIN) 100 MG/5ML syrup Take 300 mg by mouth every 4 (four) hours as needed for cough or congestion.   Yes Historical Provider, MD  hydrocortisone cream 1 % Apply 1 application topically daily as needed for itching.   Yes Historical Provider, MD  ibuprofen (ADVIL,MOTRIN) 600 MG tablet Take 600 mg by mouth 3 (three) times daily as needed for mild pain or moderate pain.    Yes Historical Provider, MD  lip balm (CARMEX) ointment Apply 1 application topically daily as  needed for lip care.   Yes Historical Provider, MD  loperamide (IMODIUM A-D) 2 MG tablet Take 4 mg by mouth 4 (four) times daily as needed for diarrhea or loose stools.   Yes Historical Provider, MD  magnesium hydroxide (MILK OF MAGNESIA) 400 MG/5ML suspension Take 30 mLs by mouth daily as needed for mild constipation.   Yes Historical Provider, MD  magnesium oxide (MAG-OX) 400 MG tablet Take 400 mg by mouth 3 (three) times daily.   Yes Historical Provider, MD  miconazole (NEOSPORIN AF) 2 % cream Apply 1 application topically 2 (two) times daily as needed.   Yes Historical Provider, MD  ondansetron (ZOFRAN-ODT) 4 MG disintegrating tablet Take 4 mg by mouth every 8 (eight) hours as needed for nausea or vomiting.   Yes Historical Provider, MD  polyethylene glycol (MIRALAX / GLYCOLAX) packet Take 17 g by mouth 2 (two) times daily. Mix with prune juice.   Yes Historical Provider, MD  promethazine (PHENERGAN) 25 MG tablet Take 25 mg by mouth every 4 (four) hours as needed for nausea or vomiting.   Yes Historical Provider, MD  scopolamine (TRANSDERM-SCOP) 1.5 MG Place 1 patch onto the skin every 3 (three) days. Behind ear.   Yes Historical Provider, MD  senna (SENOKOT) 8.6 MG TABS Take 1 tablet by mouth 2 (two) times daily.    Yes Historical Provider, MD  Skin Protectants, Misc. (EUCERIN) cream Apply 1 application topically daily as needed for dry skin.   Yes Historical Provider, MD  Skin Protectants, Misc. (LIP BALM EX) Apply 1 application topically 3 (three) times daily as needed (chapped lips.).   Yes Historical Provider, MD  Skin Protectants, Misc. (PERIGUARD) OINT Apply 1 application topically 4 (four) times daily as needed.   Yes Historical Provider, MD  sorbitol 70 % solution Take 10 mLs by mouth every evening.   Yes Historical Provider, MD  triazolam (HALCION) 0.25 MG tablet Take 0.25 mg by mouth as needed for sleep. Prior to dental procedure.   Yes Historical Provider, MD  Valproic Acid (DEPAKENE)  250 MG/5ML SYRP syrup Take 500 mg by mouth 3 (three) times daily.   Yes Historical Provider, MD  Vitamins A & D (VITAMIN A & D) ointment Apply 1 application topically 3 (three) times daily as needed for dry skin.   Yes Historical Provider, MD  ciprofloxacin (CIPRO) 500 MG tablet Take 1 tablet (500 mg total) by mouth every 12 (twelve) hours. Patient not taking: Reported on 09/23/2014 05/29/13   Gerhard Munch, MD  ciprofloxacin (CIPRO) 500 MG tablet Take 1 tablet (500 mg total) by mouth 2 (two) times daily. Patient not taking: Reported on 09/23/2014 01/21/14   Jerald Kief, MD  metroNIDAZOLE (FLAGYL) 500 MG tablet Take 1 tablet (500 mg total) by mouth every 8 (eight) hours. Patient not  taking: Reported on 09/23/2014 01/21/14   Jerald Kief, MD  ondansetron (ZOFRAN ODT) 4 MG disintegrating tablet Take 1 tablet (4 mg total) by mouth every 8 (eight) hours as needed for nausea. Patient not taking: Reported on 09/23/2014 06/22/12   Gerhard Munch, MD   BP 136/81 mmHg  Pulse 104  Temp(Src) 96.9 F (36.1 C) (Rectal)  Resp 20  SpO2 100% Physical Exam  Constitutional: She appears well-developed and well-nourished. No distress.  Pt laying in bed, nonverbal, able to follow basic commands.   HENT:  Head: Normocephalic and atraumatic.  Right Ear: Tympanic membrane normal.  Left Ear: Tympanic membrane normal.  Nose: Nose normal. No rhinorrhea.  Mouth/Throat: Uvula is midline, oropharynx is clear and moist and mucous membranes are normal. No oropharyngeal exudate.  Eyes: Conjunctivae and EOM are normal. Pupils are equal, round, and reactive to light. Right eye exhibits no discharge. Left eye exhibits no discharge. No scleral icterus.  Neck: Normal range of motion. Neck supple.  Cardiovascular: Normal rate, regular rhythm, normal heart sounds and intact distal pulses.   Pulmonary/Chest: Effort normal and breath sounds normal. She has no wheezes. She has no rales. She exhibits no tenderness.  Abdominal: Soft.  Bowel sounds are normal. She exhibits no mass. There is no tenderness. There is no rebound and no guarding.  Musculoskeletal: She exhibits no edema or tenderness.  Lymphadenopathy:    She has no cervical adenopathy.  Neurological: She is alert.  Skin: Skin is warm and dry. No rash noted. She is not diaphoretic.  Nursing note and vitals reviewed.   ED Course  Procedures (including critical care time) Labs Review Labs Reviewed  CBC WITH DIFFERENTIAL/PLATELET  COMPREHENSIVE METABOLIC PANEL  LIPASE, BLOOD  URINALYSIS, ROUTINE W REFLEX MICROSCOPIC (NOT AT [facility])  PREGNANCY, URINE    Imaging Review No results found. I have personally reviewed and evaluated these images and lab results as part of my medical decision-making.   EKG Interpretation None      MDM   Final diagnoses:  None    Pt presents with N/V and fatigue. Pt with MR and is nonverbal at baseline and with bowel and bladder incontinence. Pt not tolerating PO. VSS. Pt given IVF and zofran. Exam unremarkable. CBC, CMP and lipase unremarkable. Unable to get urine sample with in and out cath. Bladder US performed, revealed empty bladder. Pt given 2nd L of IVF. Pt reexamined and is more active and smiling in room. Urine sample obtained with in and out cath. UA unremarkable. Pending KUB  Hand-off to Earley Favor, NP. KUB pending, if negative plan to d/c pt to group home with zofran.     Satira Sark [place], [place] 09/24/14 0139  Nelva Nay, MD 09/24/14 1259

## 2014-09-23 NOTE — ED Notes (Signed)
Attempted IV x 1. Unable to start IV. Matt, RN at bedside to attempt u/s IV start.

## 2014-09-23 NOTE — ED Notes (Signed)
Attempted in/out cath. No urine return. Bladder scanned patient. No measurable urine per bladder scan.

## 2014-09-23 NOTE — ED Notes (Signed)
Pt from SNF, hx of severe mental retardation, with nurse from facility. Began having nausea and vomiting today, not keeping down her medications. Nurse states she's non-verbal, but hasn't appeared to be more agitated than normal. Appears to be lethargic in triage, nurse states she's incontinent of bowel and bladder, unable to get temp orally or axillary in triage.

## 2014-09-24 ENCOUNTER — Emergency Department (HOSPITAL_COMMUNITY): Payer: Medicare Other

## 2014-09-24 DIAGNOSIS — R112 Nausea with vomiting, unspecified: Secondary | ICD-10-CM | POA: Diagnosis not present

## 2014-09-24 LAB — URINALYSIS, ROUTINE W REFLEX MICROSCOPIC
Bilirubin Urine: NEGATIVE
GLUCOSE, UA: NEGATIVE mg/dL
HGB URINE DIPSTICK: NEGATIVE
Ketones, ur: NEGATIVE mg/dL
LEUKOCYTES UA: NEGATIVE
Nitrite: NEGATIVE
PROTEIN: NEGATIVE mg/dL
SPECIFIC GRAVITY, URINE: 1.016 (ref 1.005–1.030)
Urobilinogen, UA: 0.2 mg/dL (ref 0.0–1.0)
pH: 7.5 (ref 5.0–8.0)

## 2014-09-24 LAB — PREGNANCY, URINE: Preg Test, Ur: NEGATIVE

## 2014-09-24 MED ORDER — ONDANSETRON 4 MG PO TBDP: 4.0000 mg | ORAL_TABLET | Freq: Three times a day (TID) | ORAL | Status: DC | PRN

## 2014-09-24 MED ORDER — STARCH (THICKENING) PO POWD
ORAL | Status: DC | PRN
  Filled 2014-09-24: qty 227

## 2014-09-24 NOTE — Discharge Instructions (Signed)

## 2014-09-24 NOTE — ED Notes (Signed)
1 cup of thickened fluid PO given , tolerated.

## 2014-09-24 NOTE — ED Provider Notes (Signed)
Is asked to follow up on the x-ray.  KUB is normal.  No obstruction or severe constipation.  Patient has been able to tolerate thickened liquids.  She'll be discharged home to her group home with a prescription for Zofran  Earley Favor, NP 09/24/14 8295  Shon Baton, MD 09/24/14 [phone]

## 2014-09-25 LAB — URINE CULTURE: CULTURE: NO GROWTH

## 2014-10-28 ENCOUNTER — Emergency Department (HOSPITAL_COMMUNITY): Payer: Medicare Other

## 2014-10-28 ENCOUNTER — Emergency Department (HOSPITAL_COMMUNITY)
Admission: EM | Admit: 2014-10-28 | Discharge: 2014-10-29 | Disposition: A | Discharge status: Home / Self Care | Payer: Medicare Other | Attending: Emergency Medicine | Admitting: Emergency Medicine

## 2014-10-28 ENCOUNTER — Encounter (HOSPITAL_COMMUNITY): Payer: Self-pay | Admitting: Emergency Medicine

## 2014-10-28 DIAGNOSIS — Z8719 Personal history of other diseases of the digestive system: Secondary | ICD-10-CM | POA: Insufficient documentation

## 2014-10-28 DIAGNOSIS — M810 Age-related osteoporosis without current pathological fracture: Secondary | ICD-10-CM | POA: Diagnosis not present

## 2014-10-28 DIAGNOSIS — R197 Diarrhea, unspecified: Secondary | ICD-10-CM | POA: Insufficient documentation

## 2014-10-28 DIAGNOSIS — Z872 Personal history of diseases of the skin and subcutaneous tissue: Secondary | ICD-10-CM | POA: Insufficient documentation

## 2014-10-28 DIAGNOSIS — R5383 Other fatigue: Secondary | ICD-10-CM | POA: Diagnosis not present

## 2014-10-28 DIAGNOSIS — Z3202 Encounter for pregnancy test, result negative: Secondary | ICD-10-CM | POA: Insufficient documentation

## 2014-10-28 DIAGNOSIS — Z79899 Other long term (current) drug therapy: Secondary | ICD-10-CM | POA: Insufficient documentation

## 2014-10-28 DIAGNOSIS — F79 Unspecified intellectual disabilities: Secondary | ICD-10-CM | POA: Diagnosis not present

## 2014-10-28 DIAGNOSIS — G40909 Epilepsy, unspecified, not intractable, without status epilepticus: Secondary | ICD-10-CM | POA: Diagnosis not present

## 2014-10-28 DIAGNOSIS — R112 Nausea with vomiting, unspecified: Secondary | ICD-10-CM | POA: Insufficient documentation

## 2014-10-28 DIAGNOSIS — M8580 Other specified disorders of bone density and structure, unspecified site: Secondary | ICD-10-CM | POA: Diagnosis not present

## 2014-10-28 HISTORY — DX: Cerebral palsy, unspecified: G80.9

## 2014-10-28 LAB — COMPREHENSIVE METABOLIC PANEL
ALBUMIN: 3.9 g/dL (ref 3.5–5.0)
ALT: 10 U/L — ABNORMAL LOW (ref 14–54)
AST: 28 U/L (ref 15–41)
Alkaline Phosphatase: 48 U/L (ref 38–126)
Anion gap: 10 (ref 5–15)
BILIRUBIN TOTAL: 0.1 mg/dL — AB (ref 0.3–1.2)
BUN: 12 mg/dL (ref 6–20)
CHLORIDE: 103 mmol/L (ref 101–111)
CO2: 25 mmol/L (ref 22–32)
Calcium: 9 mg/dL (ref 8.9–10.3)
Creatinine, Ser: 0.51 mg/dL (ref 0.44–1.00)
GFR calc Af Amer: >60 mL/min (ref >60)
GFR calc non Af Amer: >60 mL/min (ref >60)
GLUCOSE: 164 mg/dL — AB (ref 65–99)
POTASSIUM: 4 mmol/L (ref 3.5–5.1)
Sodium: 138 mmol/L (ref 135–145)
TOTAL PROTEIN: 7.5 g/dL (ref 6.5–8.1)

## 2014-10-28 LAB — CBC WITH DIFFERENTIAL/PLATELET
Basophils Absolute: 0 10*3/uL (ref 0.0–0.1)
Basophils Relative: 0 %
EOS PCT: 0 %
Eosinophils Absolute: 0 10*3/uL (ref 0.0–0.7)
HEMATOCRIT: 38.1 % (ref 36.0–46.0)
Hemoglobin: 12.6 g/dL (ref 12.0–15.0)
LYMPHS ABS: 1.1 10*3/uL (ref 0.7–4.0)
LYMPHS PCT: 11 %
MCH: 26.1 pg (ref 26.0–34.0)
MCHC: 33.1 g/dL (ref 30.0–36.0)
MCV: 79 fL (ref 78.0–100.0)
MONO ABS: 1.1 10*3/uL — AB (ref 0.1–1.0)
MONOS PCT: 11 %
NEUTROS ABS: 7.9 10*3/uL — AB (ref 1.7–7.7)
Neutrophils Relative %: 78 %
PLATELETS: 185 10*3/uL (ref 150–400)
RBC: 4.82 MIL/uL (ref 3.87–5.11)
RDW: 13.9 % (ref 11.5–15.5)
WBC: 10.1 10*3/uL (ref 4.0–10.5)

## 2014-10-28 LAB — I-STAT BETA HCG BLOOD, ED (MC, WL, AP ONLY): I-stat hCG, quantitative: <5 m[IU]/mL (ref <5)

## 2014-10-28 MED ORDER — ONDANSETRON HCL 4 MG/2ML IJ SOLN
4.0000 mg | Freq: Once | INTRAMUSCULAR | Status: AC
  Administered 2014-10-28: 4 mg via INTRAVENOUS
  Filled 2014-10-28: qty 2

## 2014-10-28 MED ORDER — SODIUM CHLORIDE 0.9 % IV BOLUS (SEPSIS): 1000.0000 mL | Freq: Once | INTRAVENOUS | Status: DC

## 2014-10-28 MED ORDER — ONDANSETRON HCL 4 MG/2ML IJ SOLN: 4.0000 mg | Freq: Once | INTRAMUSCULAR | Status: DC

## 2014-10-28 NOTE — ED Notes (Signed)
[facility].  Facility staff report multiple episodes of vomiting starting around 4pm, and one episode of diarrhea.  They also stated that the pt was "more lethargic than usual" and didn't seem like herself.  Pt has hx of mental retardation and cerebral palsy and is nonverbal at baseline.  EMS states all vitals were WNL en route, and lung sounds were clear bilaterally throughout. No acute distress noted.

## 2014-10-28 NOTE — ED Notes (Signed)
Try to in-out pt and no urine return, bladder scan the pt had a total of 122 in the bladder

## 2014-10-28 NOTE — ED Notes (Signed)
Bed: [id number] Expected date:  Expected time:  Means of arrival:  Comments: 89F WCbound vomiting diarrhea

## 2014-10-29 MED ORDER — CIPROFLOXACIN HCL 500 MG PO TABS: 500.0000 mg | ORAL_TABLET | Freq: Two times a day (BID) | ORAL | Status: DC

## 2014-10-29 NOTE — ED Provider Notes (Signed)
CSN: [id number]     Arrival date & time 10/28/14  2019 History   First MD Initiated Contact with Patient 10/28/14 2029     Chief Complaint  Patient presents with  . Emesis  . Diarrhea  . Fatigue     (Consider location/radiation/quality/duration/timing/severity/associated sxs/prior Treatment) HPI  The patient is a 29 year old female, she has mental retardation, she has encephalopathy, spastic quadriparesis and seizure disorder. She presents to the hospital with several episodes of vomiting this evening, she was witnessed to have one very large stool followed by an episode of diarrhea. She stopped vomiting after that happened. The patient is unable to speak because of her severe mental retardation  Past Medical History  Diagnosis Date  . Seizures (HCC)   . Encephalopathy   . Dysphagia   . Pressure ulcer 2008    right lower extremity  . Mental retardation   . Spastic quadriparesis (HCC)   . Seizure disorder (HCC)   . Scoliosis   . Static encephalopathy (HCC)   . Osteopenia   . Dysphagia   . Osteoporosis   . Constipation   . Cerebral palsy [facility](HCC)    Past Surgical History  Procedure Laterality Date  . Hardware in back    . Back surgery     History reviewed. No pertinent family history. Social History  Substance Use Topics  . Smoking status: Never Smoker   . Smokeless tobacco: Never Used  . Alcohol Use: No   OB History    Gravida Para Term Preterm AB TAB SAB Ectopic Multiple Living   0 0 0 0 0 0 0 0       Review of Systems  Unable to perform ROS: Patient nonverbal      Allergies  Review of patient's allergies indicates no known allergies.  Home Medications   Prior to Admission medications   Medication Sig Start Date End Date Taking? Authorizing Provider  baclofen (LIORESAL) 20 MG tablet Take 20 mg by mouth 3 (three) times daily.   Yes Historical Provider, MD  benzoyl peroxide 10 % gel Apply 1 application topically 2 (two) times daily.    Yes Historical  Provider, MD  brompheniramine-pseudoephedrine (DIMETAPP) 1-15 MG/5ML ELIX Take 20 mLs by mouth every 4 (four) hours as needed for congestion.   Yes Historical Provider, MD  calcium carbonate (TUMS - DOSED IN MG ELEMENTAL CALCIUM) 500 MG chewable tablet Chew 1 tablet by mouth daily.    Yes Historical Provider, MD  Carbamazepine (EQUETRO) 300 MG CP12 Take 600 mg by mouth 2 (two) times daily.   Yes Historical Provider, MD  chlorhexidine (PERIDEX) 0.12 % solution Use as directed 10 mLs in the mouth or throat daily.   Yes Historical Provider, MD  cholecalciferol (VITAMIN D) 1000 UNITS tablet Take 2,000 Units by mouth daily.   Yes Historical Provider, MD  magnesium oxide (MAG-OX) 400 MG tablet Take 400 mg by mouth 3 (three) times daily.   Yes Historical Provider, MD  ondansetron (ZOFRAN ODT) 4 MG disintegrating tablet Take 1 tablet (4 mg total) by mouth every 8 (eight) hours as needed for nausea. 09/24/14  Yes Earley FavorGail Schulz, NP  polyethylene glycol (MIRALAX / GLYCOLAX) packet Take 17 g by mouth 2 (two) times daily. Mix with prune juice.   Yes Historical Provider, MD  scopolamine (TRANSDERM-SCOP) 1.5 MG Place 1 patch onto the skin every 3 (three) days. Behind ear.   Yes Historical Provider, MD  senna (SENOKOT) 8.6 MG TABS Take 1 tablet by mouth 2 (two)  times daily.    Yes Historical Provider, MD  Sodium Phosphates (TGT SALINE LAXATIVE RE) Place 1 suppository rectally daily as needed (constipation).   Yes Historical Provider, MD  sorbitol 70 % solution Take 10 mLs by mouth every evening.   Yes Historical Provider, MD  Valproic Acid (DEPAKENE) 250 MG/5ML SYRP syrup Take 500 mg by mouth 3 (three) times daily.   Yes Historical Provider, MD  acetaminophen (TYLENOL) 160 MG/5ML liquid Take 320 mg by mouth every 4 (four) hours as needed for pain.     Historical Provider, MD  acetaminophen (TYLENOL) 325 MG tablet Take 650 mg by mouth every 4 (four) hours as needed for moderate pain.    Historical Provider, MD  alum &  mag hydroxide-simeth (MAALOX/MYLANTA) 200-200-20 MG/5ML suspension Take 15 mLs by mouth every 2 (two) hours as needed for indigestion or heartburn.    Historical Provider, MD  bisacodyl (DULCOLAX) 10 MG suppository Place 10 mg rectally as needed for moderate constipation.    Historical Provider, MD  ciprofloxacin (CIPRO) 500 MG tablet Take 1 tablet (500 mg total) by mouth 2 (two) times daily. 10/29/14   Eber Hong, MD  diphenhydrAMINE (BENADRYL) 25 mg capsule Take 25 mg by mouth every 4 (four) hours as needed for itching or allergies.    Historical Provider, MD  guaifenesin (ROBITUSSIN) 100 MG/5ML syrup Take 300 mg by mouth every 4 (four) hours as needed for cough or congestion.    Historical Provider, MD  ibuprofen (ADVIL,MOTRIN) 600 MG tablet Take 600 mg by mouth 3 (three) times daily as needed for mild pain or moderate pain.     Historical Provider, MD  lip balm (CARMEX) ointment Apply 1 application topically daily as needed for lip care.    Historical Provider, MD  loperamide (IMODIUM A-D) 2 MG tablet Take 4 mg by mouth 4 (four) times daily as needed for diarrhea or loose stools.    Historical Provider, MD  magnesium hydroxide (MILK OF MAGNESIA) 400 MG/5ML suspension Take 30 mLs by mouth daily as needed for mild constipation.    Historical Provider, MD  miconazole (NEOSPORIN AF) 2 % cream Apply 1 application topically 2 (two) times daily as needed.    Historical Provider, MD  promethazine (PHENERGAN) 25 MG tablet Take 25 mg by mouth every 4 (four) hours as needed for nausea or vomiting.    Historical Provider, MD  Skin Protectants, Misc. (EUCERIN) cream Apply 1 application topically daily as needed for dry skin.    Historical Provider, MD  Skin Protectants, Misc. (LIP BALM EX) Apply 1 application topically 3 (three) times daily as needed (chapped lips.).    Historical Provider, MD  Skin Protectants, Misc. (PERIGUARD) OINT Apply 1 application topically 4 (four) times daily as needed.    Historical  Provider, MD  triazolam (HALCION) 0.25 MG tablet Take 0.25 mg by mouth as needed for sleep. Prior to dental procedure.    Historical Provider, MD  Vitamins A & D (VITAMIN A & D) ointment Apply 1 application topically 3 (three) times daily as needed for dry skin.    Historical Provider, MD   BP 124/70 mmHg  Pulse 104  Temp(Src) 97.4 F (36.3 C) (Axillary)  Resp 20  Ht 5\' 4"  (1.626 m)  Wt 110 lb (49.896 kg)  BMI 18.87 kg/m2  SpO2 100% Physical Exam  Constitutional: She appears well-developed and well-nourished. No distress.  HENT:  Head: Normocephalic and atraumatic.  Mouth/Throat: Oropharynx is clear and moist. No oropharyngeal exudate.  Eyes:  Conjunctivae and EOM are normal. Pupils are equal, round, and reactive to light. Right eye exhibits no discharge. Left eye exhibits no discharge. No scleral icterus.  Neck: Normal range of motion. Neck supple. No JVD present. No thyromegaly present.  Cardiovascular: Normal rate, regular rhythm, normal heart sounds and intact distal pulses.  Exam reveals no gallop and no friction rub.   No murmur heard. Pulmonary/Chest: Effort normal and breath sounds normal. No respiratory distress. She has no wheezes. She has no rales.  Abdominal: Soft. Bowel sounds are normal. She exhibits no distension and no mass. There is no tenderness.  Musculoskeletal: Normal range of motion. She exhibits no edema or tenderness.  Lymphadenopathy:    She has no cervical adenopathy.  Neurological: She is alert. Coordination normal.  The patient is able to open her eyes, she does not follow commands, she does not speak, she has normal-appearing pupils, she has flexion contractures diffusely  Skin: Skin is warm and dry. No rash noted. No erythema.  Psychiatric: She has a normal mood and affect. Her behavior is normal.  Nursing note and vitals reviewed.   ED Course  Procedures (including critical care time) Labs Review Labs Reviewed  COMPREHENSIVE METABOLIC PANEL -  Abnormal; Notable for the following:    Glucose, Bld 164 (*)    ALT 10 (*)    Total Bilirubin 0.1 (*)    All other components within normal limits  CBC WITH DIFFERENTIAL/PLATELET - Abnormal; Notable for the following:    Neutro Abs 7.9 (*)    Monocytes Absolute 1.1 (*)    All other components within normal limits  URINALYSIS, ROUTINE W REFLEX MICROSCOPIC (NOT AT [facility])  I-STAT BETA HCG BLOOD, ED ([facility], [facility], [facility] ONLY)    Imaging Review Dg Abd 1 View  10/28/2014  CLINICAL DATA:  Abdominal pain.  Vomiting.  Diarrhea. EXAM: ABDOMEN - 1 VIEW COMPARISON:  09/24/2014 FINDINGS: There is no bowel dilatation to suggest obstruction. There is no evidence of pneumoperitoneum, portal venous gas or pneumatosis. There are no pathologic calcifications along the expected course of the ureters. There is generalized osteopenia. There is posterior spinal fixation hardware present.The osseous structures are unremarkable. IMPRESSION: No evidence of bowel obstruction. Electronically Signed   By: Elige Ko   On: 10/28/2014 22:58   Dg Chest Port 1 View  10/28/2014  CLINICAL DATA:  Multiple episodes of vomiting starting around 4 p.m. Diarrhea. Patient was more left RT within usual. EXAM: PORTABLE CHEST 1 VIEW COMPARISON:  06/21/2012 FINDINGS: Shallow inspiration. Normal heart size and pulmonary vascularity. No focal airspace consolidation in the lungs. No blunting of costophrenic angles. No pneumothorax. Mediastinal contours appear intact. Thoracolumbar scoliosis with posterior rod and screw fixation of the visualize thoracolumbar spine. IMPRESSION: Shallow inspiration.  No evidence of active pulmonary disease. Electronically Signed   By: Burman Nieves M.D.   On: 10/28/2014 22:57   I have personally reviewed and evaluated these images and lab results as part of my medical decision-making.    MDM   Final diagnoses:  Non-intractable vomiting with nausea, vomiting of unspecified type    The patient does not  appear ill, she has not vomited the entire time that she has been here, she does have approximately 120 mL in the bladder on a bed side bladder scan however in and out catheterization was unsuccessful. Etiology of the symptoms could've been from constipation, x-ray shows no residual significant amount of stool, no bowel obstruction, chest x-ray shows no infiltrates, labs are rather unremarkable.  Will also need antibiotics for possible UTI, appear stable for discharge. Precautions for return given on paperwork and will be communicated to patient's home facility by nursing  Meds given in ED:  Medications  ondansetron [facility]) injection 4 mg (4 mg Intravenous Given 10/28/14 2145)    New Prescriptions   CIPROFLOXACIN (CIPRO) 500 MG TABLET    Take 1 tablet (500 mg total) by mouth 2 (two) times daily.        Eber Hong, MD 10/29/14 [id number]

## 2014-10-29 NOTE — Discharge Instructions (Signed)
We are treating Sheila Costa for a possible urinary infection, we are unable to get urine on a catheterized specimen, I recommend that she follows up very closely with her doctor, if she continues to have vomiting please come back to the emergency department for repeat evaluation.  Please obtain all of your results from medical records or have your doctors office obtain the results - share them with your doctor - you should be seen at your doctors office in the next 2 days. Call today to arrange your follow up. Take the medications as prescribed. Please review all of the medicines and only take them if you do not have an allergy to them. Please be aware that if you are taking birth control pills, taking other prescriptions, ESPECIALLY ANTIBIOTICS may make the birth control ineffective - if this is the case, either do not engage in sexual activity or use alternative methods of birth control such as condoms until you have finished the medicine and your family doctor says it is OK to restart them. If you are on a blood thinner such as COUMADIN, be aware that any other medicine that you take may cause the coumadin to either work too much, or not enough - you should have your coumadin level rechecked in next 7 days if this is the case.  ?  It is also a possibility that you have an allergic reaction to any of the medicines that you have been prescribed - Everybody reacts differently to medications and while MOST people have no trouble with most medicines, you may have a reaction such as nausea, vomiting, rash, swelling, shortness of breath. If this is the case, please stop taking the medicine immediately and contact your physician.  ?  You should return to the ER if you develop severe or worsening symptoms.

## 2015-02-14 ENCOUNTER — Encounter (HOSPITAL_COMMUNITY): Payer: Self-pay

## 2015-02-14 ENCOUNTER — Emergency Department (HOSPITAL_COMMUNITY)
Admission: EM | Admit: 2015-02-14 | Discharge: 2015-02-15 | Disposition: A | Discharge status: Home / Self Care | Payer: Medicare Other | Attending: Emergency Medicine | Admitting: Emergency Medicine

## 2015-02-14 DIAGNOSIS — M62422 Contracture of muscle, left upper arm: Secondary | ICD-10-CM | POA: Diagnosis not present

## 2015-02-14 DIAGNOSIS — Z79899 Other long term (current) drug therapy: Secondary | ICD-10-CM | POA: Diagnosis not present

## 2015-02-14 DIAGNOSIS — K5900 Constipation, unspecified: Secondary | ICD-10-CM | POA: Insufficient documentation

## 2015-02-14 DIAGNOSIS — M62462 Contracture of muscle, left lower leg: Secondary | ICD-10-CM | POA: Insufficient documentation

## 2015-02-14 DIAGNOSIS — M810 Age-related osteoporosis without current pathological fracture: Secondary | ICD-10-CM | POA: Insufficient documentation

## 2015-02-14 DIAGNOSIS — R112 Nausea with vomiting, unspecified: Secondary | ICD-10-CM | POA: Insufficient documentation

## 2015-02-14 DIAGNOSIS — Z8669 Personal history of other diseases of the nervous system and sense organs: Secondary | ICD-10-CM | POA: Insufficient documentation

## 2015-02-14 DIAGNOSIS — M62461 Contracture of muscle, right lower leg: Secondary | ICD-10-CM | POA: Insufficient documentation

## 2015-02-14 DIAGNOSIS — M62421 Contracture of muscle, right upper arm: Secondary | ICD-10-CM | POA: Diagnosis not present

## 2015-02-14 DIAGNOSIS — M8580 Other specified disorders of bone density and structure, unspecified site: Secondary | ICD-10-CM | POA: Insufficient documentation

## 2015-02-14 MED ORDER — SODIUM CHLORIDE 0.9 % IV BOLUS (SEPSIS)
1000.0000 mL | Freq: Once | INTRAVENOUS | Status: AC
  Administered 2015-02-15: 1000 mL via INTRAVENOUS

## 2015-02-14 MED ORDER — ONDANSETRON HCL 4 MG/2ML IJ SOLN
4.0000 mg | Freq: Once | INTRAMUSCULAR | Status: AC
  Administered 2015-02-15: 4 mg via INTRAVENOUS
  Filled 2015-02-14: qty 2

## 2015-02-14 NOTE — ED Notes (Signed)
Nurse drawing labs. 

## 2015-02-14 NOTE — ED Notes (Signed)
MD at bedside. 

## 2015-02-14 NOTE — ED Notes (Signed)
Caregivers state that pt has been vomiting for several weeks, she has an appt to have a procedure done in about two weeks, she also has a decubitus on her sacrum that isn't healing

## 2015-02-14 NOTE — ED Notes (Addendum)
Disregard previous note

## 2015-02-14 NOTE — ED Provider Notes (Signed)
CSN: [id number]     Arrival date & time 02/14/15  2152 History  By signing my name below, I, Sonum Patel, attest that this documentation has been prepared under the direction and in the presence of Azalia Bilis, MD. Electronically Signed: Sonum Patel, Neurosurgeon. 02/14/2015. 11:23 PM.    Chief Complaint  Patient presents with  . Vomiting    The history is provided by a caregiver. The history is limited by the condition of the patient. No language interpreter was used.    LEVEL 5 CAVEAT: Nonverbal; mental retardation  HPI Comments: Sheila Costa is a 30 y.o. female with past medical history of mental retardation, cerebral palsy, spastic quadriparesis, seizures, encephalopathy who presents to the Emergency Department from a group home complaining of intermittent episodes of nausea and vomiting that has been ongoing for the last couple of months. Caregiver states the most recent episode began earlier this evening (02/14/15). She states the episodes can last from a few hours to a few days at a time. Patient was given Zofran PTA but she did not tolerate it well. Her last episode of emesis was after taking the Zofran. Caregiver has noticed recent weight loss. She was seen by a GI specialist who has scheduled an upper endoscopy for next week; she was not started on any new medications. Caregiver states patient has had regular BM's. She denies fever, chills, hematemesis, current diarrhea.   Past Medical History  Diagnosis Date  . Seizures (HCC)   . Encephalopathy   . Dysphagia   . Pressure ulcer 2008    right lower extremity  . Mental retardation   . Spastic quadriparesis (HCC)   . Seizure disorder (HCC)   . Scoliosis   . Static encephalopathy (HCC)   . Osteopenia   . Dysphagia   . Osteoporosis   . Constipation   . Cerebral palsy [facility])    Past Surgical History  Procedure Laterality Date  . Hardware in back    . Back surgery     History reviewed. No pertinent family history. Social  History  Substance Use Topics  . Smoking status: Never Smoker   . Smokeless tobacco: Never Used  . Alcohol Use: No   OB History    Gravida Para Term Preterm AB TAB SAB Ectopic Multiple Living   0 0 0 0 0 0 0 0       Review of Systems  Unable to perform ROS: Patient nonverbal    Allergies  Review of patient's allergies indicates no known allergies.  Home Medications   Prior to Admission medications   Medication Sig Start Date End Date Taking? Authorizing Provider  baclofen (LIORESAL) 20 MG tablet Take 10-20 mg by mouth 3 (three) times daily. Take 1 tab (10 mg) in the am, Take 2 tabs (20 mg) at 2 pm and Take 2 tabs (20 mg) at 8 pm.   Yes Historical Provider, MD  benzoyl peroxide 10 % gel Apply 1 application topically 2 (two) times daily.    Yes Historical Provider, MD  brompheniramine-pseudoephedrine-dextromethorphan (DIMETAPP DM) 15-1-5 MG/5ML ELIX Take 20 mLs by mouth every 4 (four) hours as needed (nasal congestion).   Yes Historical Provider, MD  calcium carbonate (TUMS - DOSED IN MG ELEMENTAL CALCIUM) 500 MG chewable tablet Chew 1 tablet by mouth daily.    Yes Historical Provider, MD  Carbamazepine (EQUETRO) 300 MG CP12 Take 600-900 mg by mouth 2 (two) times daily. Take 2 capsules (600 mg) in the am and Take 3 capsules (  900 mg) in the pm.   Yes Historical Provider, MD  chlorhexidine (PERIDEX) 0.12 % solution Use as directed 10 mLs in the mouth or throat daily.   Yes Historical Provider, MD  cholecalciferol (VITAMIN D) 1000 UNITS tablet Take 2,000 Units by mouth daily.   Yes Historical Provider, MD  collagenase (SANTYL) ointment Apply 1 application topically daily.   Yes Historical Provider, MD  hydrocortisone cream 1 % Apply 1 application topically daily as needed for itching.   Yes Historical Provider, MD  levOCARNitine (CARNITOR) 330 MG tablet Take 330 mg by mouth 2 (two) times daily.   Yes Historical Provider, MD  magnesium oxide (MAG-OX) 400 MG tablet Take 400 mg by mouth 3  (three) times daily.   Yes Historical Provider, MD  mineral oil enema Place 1 enema rectally daily as needed for moderate constipation or severe constipation.   Yes Historical Provider, MD  polyethylene glycol (MIRALAX / GLYCOLAX) packet Take 17 g by mouth 2 (two) times daily. Mix with prune juice.   Yes Historical Provider, MD  senna (SENOKOT) 8.6 MG TABS Take 1 tablet by mouth 2 (two) times daily.    Yes Historical Provider, MD  sorbitol 70 % solution Take 10 mLs by mouth every evening.   Yes Historical Provider, MD  Valproic Acid (DEPAKENE) 250 MG/5ML SYRP syrup Take 500 mg by mouth 3 (three) times daily.   Yes Historical Provider, MD  acetaminophen (TYLENOL) 160 MG/5ML liquid Take 320 mg by mouth every 4 (four) hours as needed for pain.     Historical Provider, MD  acetaminophen (TYLENOL) 325 MG tablet Take 650 mg by mouth every 4 (four) hours as needed for moderate pain.    Historical Provider, MD  alum & mag hydroxide-simeth (MAALOX/MYLANTA) 200-200-20 MG/5ML suspension Take 15 mLs by mouth every 2 (two) hours as needed for indigestion or heartburn.    Historical Provider, MD  bisacodyl (DULCOLAX) 10 MG suppository Place 10 mg rectally as needed for moderate constipation.    Historical Provider, MD  brompheniramine-pseudoephedrine (DIMETAPP) 1-15 MG/5ML ELIX Take 20 mLs by mouth every 4 (four) hours as needed for congestion.    Historical Provider, MD  ciprofloxacin (CIPRO) 500 MG tablet Take 1 tablet (500 mg total) by mouth 2 (two) times daily. Patient not taking: Reported on 02/14/2015 10/29/14   Eber Hong, MD  diphenhydrAMINE (BENADRYL) 25 mg capsule Take 25 mg by mouth every 4 (four) hours as needed for itching or allergies.    Historical Provider, MD  guaifenesin (ROBITUSSIN) 100 MG/5ML syrup Take 300 mg by mouth every 4 (four) hours as needed for cough or congestion.    Historical Provider, MD  ibuprofen (ADVIL,MOTRIN) 600 MG tablet Take 600 mg by mouth 3 (three) times daily as needed  for mild pain or moderate pain.     Historical Provider, MD  lip balm (CARMEX) ointment Apply 1 application topically daily as needed for lip care.    Historical Provider, MD  loperamide (IMODIUM A-D) 2 MG tablet Take 4 mg by mouth 4 (four) times daily as needed for diarrhea or loose stools.    Historical Provider, MD  magnesium hydroxide (MILK OF MAGNESIA) 400 MG/5ML suspension Take 30 mLs by mouth daily as needed for mild constipation.    Historical Provider, MD  miconazole (NEOSPORIN AF) 2 % cream Apply 1 application topically 2 (two) times daily as needed.    Historical Provider, MD  ondansetron (ZOFRAN ODT) 4 MG disintegrating tablet Take 1 tablet (4 mg total)  by mouth every 8 (eight) hours as needed for nausea. 09/24/14   Earley Favor, NP  promethazine (PHENERGAN) 25 MG tablet Take 25 mg by mouth every 4 (four) hours as needed for nausea or vomiting.    Historical Provider, MD  scopolamine (TRANSDERM-SCOP) 1.5 MG Place 1 patch onto the skin every 3 (three) days. Behind ear.    Historical Provider, MD  Skin Protectants, Misc. (EUCERIN) cream Apply 1 application topically daily as needed for dry skin.    Historical Provider, MD  Skin Protectants, Misc. (LIP BALM EX) Apply 1 application topically 3 (three) times daily as needed (chapped lips.).    Historical Provider, MD  Skin Protectants, Misc. (PERIGUARD) OINT Apply 1 application topically 4 (four) times daily as needed.    Historical Provider, MD  Sodium Phosphates (TGT SALINE LAXATIVE RE) Place 1 suppository rectally daily as needed (constipation).    Historical Provider, MD  triazolam (HALCION) 0.25 MG tablet Take 0.25 mg by mouth as needed for sleep. Prior to dental procedure.    Historical Provider, MD  Vitamins A & D (VITAMIN A & D) ointment Apply 1 application topically 3 (three) times daily as needed for dry skin.    Historical Provider, MD   BP 97/64 mmHg  Pulse 88  Temp(Src) 96.7 F (35.9 C) (Axillary)  Resp 15  Wt 80 lb (36.288 kg)   SpO2 100% Physical Exam  Constitutional: She appears well-developed and well-nourished. No distress.  HENT:  Head: Normocephalic and atraumatic.  Eyes: EOM are normal.  Neck: Normal range of motion.  Cardiovascular: Normal rate, regular rhythm and normal heart sounds.   Pulmonary/Chest: Effort normal and breath sounds normal.  Abdominal: Soft. She exhibits no distension. There is no tenderness.  Musculoskeletal: Normal range of motion.  Contractures of upper and lower extremities.   Neurological: She is alert.  Skin: Skin is warm and dry.  Psychiatric: She has a normal mood and affect. Judgment normal.  Nursing note and vitals reviewed.   ED Course  Procedures (including critical care time)  DIAGNOSTIC STUDIES: Oxygen Saturation is 100% on RA, normal by my interpretation.    COORDINATION OF CARE: 11:23 PM Discussed treatment plan with caregiver at bedside and she agreed to plan.  2:20 AM Patient tolerated oral fluids.    Labs Review Labs Reviewed  CBC - Abnormal; Notable for the following:    MCH 25.5 (*)    All other components within normal limits  COMPREHENSIVE METABOLIC PANEL - Abnormal; Notable for the following:    Glucose, Bld 115 (*)    ALT 10 (*)    All other components within normal limits  LIPASE, BLOOD    Imaging Review No results found. I have personally reviewed and evaluated these lab results as part of my medical decision-making.   EKG Interpretation None      MDM   Final diagnoses:  None    Patient has scheduled follow-up with GI and scheduled endoscopy.  Abdominal exam is benign.  Tolerating fluids at this time.  Home with suppository Phenergan as well as ODT Zofran.  I personally performed the services described in this documentation, which was scribed in my presence. The recorded information has been reviewed and is accurate.       Azalia Bilis, MD 02/15/15 [phone]

## 2015-02-15 DIAGNOSIS — R112 Nausea with vomiting, unspecified: Secondary | ICD-10-CM | POA: Diagnosis not present

## 2015-02-15 LAB — COMPREHENSIVE METABOLIC PANEL
ALK PHOS: 47 U/L (ref 38–126)
ALT: 10 U/L — ABNORMAL LOW (ref 14–54)
ANION GAP: 8 (ref 5–15)
AST: 24 U/L (ref 15–41)
Albumin: 3.9 g/dL (ref 3.5–5.0)
BILIRUBIN TOTAL: 0.7 mg/dL (ref 0.3–1.2)
BUN: 11 mg/dL (ref 6–20)
CALCIUM: 9.4 mg/dL (ref 8.9–10.3)
CO2: 28 mmol/L (ref 22–32)
Chloride: 108 mmol/L (ref 101–111)
Creatinine, Ser: 0.5 mg/dL (ref 0.44–1.00)
GFR calc Af Amer: >60 mL/min (ref >60)
Glucose, Bld: 115 mg/dL — ABNORMAL HIGH (ref 65–99)
POTASSIUM: 4.2 mmol/L (ref 3.5–5.1)
Sodium: 144 mmol/L (ref 135–145)
TOTAL PROTEIN: 7.3 g/dL (ref 6.5–8.1)

## 2015-02-15 LAB — CBC
HEMATOCRIT: 38.7 % (ref 36.0–46.0)
Hemoglobin: 12.3 g/dL (ref 12.0–15.0)
MCH: 25.5 pg — ABNORMAL LOW (ref 26.0–34.0)
MCHC: 31.8 g/dL (ref 30.0–36.0)
MCV: 80.1 fL (ref 78.0–100.0)
Platelets: 273 10*3/uL (ref 150–400)
RBC: 4.83 MIL/uL (ref 3.87–5.11)
RDW: 14.1 % (ref 11.5–15.5)
WBC: 5 10*3/uL (ref 4.0–10.5)

## 2015-02-15 LAB — LIPASE, BLOOD: LIPASE: 24 U/L (ref 11–51)

## 2015-02-15 MED ORDER — PROMETHAZINE HCL 12.5 MG RE SUPP: 12.5000 mg | Freq: Four times a day (QID) | RECTAL | Status: DC | PRN

## 2015-02-15 MED ORDER — ONDANSETRON 8 MG PO TBDP: 8.0000 mg | ORAL_TABLET | Freq: Three times a day (TID) | ORAL | Status: DC | PRN

## 2015-02-15 MED ORDER — ONDANSETRON 4 MG PO TBDP: 4.0000 mg | ORAL_TABLET | Freq: Three times a day (TID) | ORAL | Status: DC | PRN

## 2015-02-15 MED ORDER — RESOURCE THICKENUP CLEAR PO POWD
ORAL | Status: DC | PRN
  Administered 2015-02-15: 01:00:00 via ORAL
  Filled 2015-02-15: qty 125

## 2015-02-15 NOTE — ED Notes (Addendum)
Patient given apple juice thickened with resource thickenup clear.

## 2015-02-15 NOTE — ED Notes (Signed)
Patient dressed. Awaiting for facility to bring patient's wheelchair so she can go home.

## 2015-02-15 NOTE — ED Notes (Signed)
Discharge instructions, follow up care, and rx x2 reviewed with patient's care giver. Patient's care giver verbalized understanding.

## 2015-02-15 NOTE — ED Notes (Addendum)
Patient tolerated juice well. Patient has not vomited. Jerlyn Ly, NT attempted to obtain vital signs, but patient is tensing up (unable to obtain) Will attempt again shortly.

## 2015-02-15 NOTE — ED Notes (Signed)
Message sent to pharmacy regarding food thickener. Patient requires thickened fluids.

## 2015-02-22 ENCOUNTER — Encounter (HOSPITAL_COMMUNITY): Payer: Self-pay | Admitting: *Deleted

## 2015-02-22 NOTE — Progress Notes (Signed)
Pt's guardian, Selinda Flavin returned my call. She was not aware that pt is having a procedure done on Friday. She won't be able to be here for the procedure. I have asked her to fax [place] the guardianship papers and a number that she will be available at to do a consent over the phone. I gave her the fax # and she will fax these to [place] on Thursday.

## 2015-02-22 NOTE — Progress Notes (Signed)
Spoke with Earl Lites, a nurse at [facility] where pt is a resident. He verified DOB, allergies, medications and medical history. Pt is non verbal and is in a wheel chair. She will be arriving via group home transportation and a staff person from the group home will be arriving with her. Pt can only take her meds with applesauce so I instructed Earl Lites not to give her any of her meds prior to her procedure on Friday. He voiced understanding.  I called and left a message for Selinda Flavin, pt's guardian to find out if she will be here Friday or if we will need to call her for consent.

## 2015-02-24 ENCOUNTER — Ambulatory Visit (HOSPITAL_COMMUNITY): Payer: Medicare Other | Admitting: Critical Care Medicine

## 2015-02-24 ENCOUNTER — Ambulatory Visit (HOSPITAL_COMMUNITY)
Admission: RE | Admit: 2015-02-24 | Discharge: 2015-02-24 | Disposition: A | Discharge status: Home / Self Care | Payer: Medicare Other | Source: Ambulatory Visit | Attending: Gastroenterology | Admitting: Gastroenterology

## 2015-02-24 ENCOUNTER — Encounter (HOSPITAL_COMMUNITY): Payer: Self-pay | Admitting: Critical Care Medicine

## 2015-02-24 ENCOUNTER — Encounter (HOSPITAL_COMMUNITY)
Admission: RE | Disposition: A | Discharge status: Home / Self Care | Payer: Self-pay | Source: Ambulatory Visit | Attending: Gastroenterology

## 2015-02-24 ENCOUNTER — Other Ambulatory Visit (HOSPITAL_COMMUNITY): Payer: Self-pay | Admitting: Gastroenterology

## 2015-02-24 DIAGNOSIS — K2950 Unspecified chronic gastritis without bleeding: Secondary | ICD-10-CM | POA: Insufficient documentation

## 2015-02-24 DIAGNOSIS — K449 Diaphragmatic hernia without obstruction or gangrene: Secondary | ICD-10-CM | POA: Insufficient documentation

## 2015-02-24 DIAGNOSIS — K319 Disease of stomach and duodenum, unspecified: Secondary | ICD-10-CM | POA: Diagnosis present

## 2015-02-24 DIAGNOSIS — R569 Unspecified convulsions: Secondary | ICD-10-CM | POA: Diagnosis not present

## 2015-02-24 DIAGNOSIS — R112 Nausea with vomiting, unspecified: Secondary | ICD-10-CM

## 2015-02-24 HISTORY — PX: ESOPHAGOGASTRODUODENOSCOPY (EGD) WITH PROPOFOL: SHX5813

## 2015-02-24 HISTORY — DX: Failure to thrive (child): R62.51

## 2015-02-24 SURGERY — ESOPHAGOGASTRODUODENOSCOPY (EGD) WITH PROPOFOL
Anesthesia: Monitor Anesthesia Care

## 2015-02-24 MED ORDER — SODIUM CHLORIDE 0.9 % IV SOLN: INTRAVENOUS | Status: DC

## 2015-02-24 MED ORDER — PROPOFOL 500 MG/50ML IV EMUL
INTRAVENOUS | Status: DC | PRN
  Administered 2015-02-24: 75 ug/kg/min via INTRAVENOUS

## 2015-02-24 MED ORDER — LACTATED RINGERS IV SOLN
INTRAVENOUS | Status: DC
  Administered 2015-02-24: 1000 mL via INTRAVENOUS

## 2015-02-24 MED ORDER — PROPOFOL 10 MG/ML IV BOLUS
INTRAVENOUS | Status: DC | PRN
  Administered 2015-02-24 (×4): 10 mg via INTRAVENOUS

## 2015-02-24 NOTE — Discharge Instructions (Signed)

## 2015-02-24 NOTE — Op Note (Signed)
[facility] [address] [place], [address]   ENDOSCOPY PROCEDURE REPORT  PATIENT: Sheila Costa, Sheila Costa  MR#: [id number] BIRTHDATE: 1985/09/04 , 30  yrs. old GENDER: female ENDOSCOPIST: Wandalee Ferdinand, MD REFERRED BY: PROCEDURE DATE:  02/28/15 PROCEDURE:  EGD with biopsy ASA CLASS:     2 INDICATIONS:  ongoing problems with vomiting MEDICATIONS: propofol per anesthesia TOPICAL ANESTHETIC:  DESCRIPTION OF PROCEDURE: After the risks benefits and alternatives of the procedure were thoroughly explained, informed consent was obtained.  The Pentax Gastroscope [id number] endoscope was introduced through the mouth and advanced to the second portion of the duodenum , Without limitations.  The instrument was slowly withdrawn as the mucosa was fully examined. Estimated blood loss is zero unless otherwise noted in this procedure report.  Findings:  Esophagus: Normal  Stomach: Moderately large hiatal hernia. Erythematous gastropathy. Biopsies obtained from the antrum for histological inspection. No evidence of gastric outlet obstruction.  Duodenum: Normal    The scope was then withdrawn from the patient and the procedure completed.  COMPLICATIONS: There were no immediate complications.  ENDOSCOPIC IMPRESSION:see above   RECOMMENDATIONS:check biopsies. Ongoing problems with vomiting could be secondary to her moderately large hiatal hernia. One must also consider the possibility of gastroparesis. Follow clinically.   REPEAT EXAM:  eSignedWandalee Ferdinand, MD 2015-02-28 9:53 AM    CC:  CPT CODES: ICD CODES:  The ICD and CPT codes recommended by this software are interpretations from the data that the clinical staff has captured with the software.  The verification of the translation of this report to the ICD and CPT codes and modifiers is the sole responsibility of the health care institution and practicing physician where this report was generated.   [employer] will not be held responsible for the validity of the ICD and CPT codes included on this report.  AMA assumes no liability for data contained or not contained herein. CPT is a Publishing rights manager of the [employer].  PATIENT NAME:  Sheila Costa, Sheila Costa MR#: [id number]

## 2015-02-24 NOTE — Anesthesia Preprocedure Evaluation (Addendum)
Anesthesia Evaluation  Patient identified by MRN, date of birth, ID band Patient awake    Reviewed: Allergy & Precautions, NPO status , Patient's Chart, lab work & pertinent test results  Airway Mallampati: II       Dental  (+) Dental Advisory Given   Pulmonary    breath sounds clear to auscultation       Cardiovascular  Rhythm:Regular     Neuro/Psych Seizures -,  PSYCHIATRIC DISORDERS Spastic quadriplegia, cerebral palsy, MR    GI/Hepatic   Endo/Other    Renal/GU      Musculoskeletal   Abdominal   Peds  Hematology   Anesthesia Other Findings   Reproductive/Obstetrics                            Anesthesia Physical Anesthesia Plan  ASA: III  Anesthesia Plan: MAC   Post-op Pain Management:    Induction: Intravenous  Airway Management Planned: Nasal Cannula  Additional Equipment:   Intra-op Plan:   Post-operative Plan:   Informed Consent: I have reviewed the patients History and Physical, chart, labs and discussed the procedure including the risks, benefits and alternatives for the proposed anesthesia with the patient or authorized representative who has indicated his/her understanding and acceptance.   Dental advisory given  Plan Discussed with: Anesthesiologist and Surgeon  Anesthesia Plan Comments:         Anesthesia Quick Evaluation

## 2015-02-24 NOTE — Anesthesia Procedure Notes (Signed)
Procedure Name: MAC Date/Time: 02/24/2015 9:29 AM Performed by: Glo Herring B Pre-anesthesia Checklist: Emergency Drugs available, Suction available, Patient being monitored and Patient identified Patient Re-evaluated:Patient Re-evaluated prior to inductionOxygen Delivery Method: Nasal cannula Intubation Type: IV induction Placement Confirmation: positive ETCO2 and breath sounds checked- equal and bilateral Dental Injury: Teeth and Oropharynx as per pre-operative assessment

## 2015-02-24 NOTE — H&P (Signed)
The patient presents to the outpatient department of endoscopy for an EGD. Procedure is being done secondary to ongoing complaints of vomiting  Physical/ no acute distress  Heart regular rhythm  Lungs clear  Abdomen soft and nontender  Impression: Vomiting  Plan EGD

## 2015-02-24 NOTE — Transfer of Care (Signed)
Immediate Anesthesia Transfer of Care Note  Patient: Sheila Costa  Procedure(s) Performed: Procedure(s): ESOPHAGOGASTRODUODENOSCOPY (EGD) WITH PROPOFOL (N/A)  Patient Location: Endoscopy Unit  Anesthesia Type:MAC  Level of Consciousness: awake and alert   Airway & Oxygen Therapy: Patient Spontanous Breathing and Patient connected to nasal cannula oxygen  Post-op Assessment: Report given to RN, Post -op Vital signs reviewed and stable and Patient moving all extremities X 4  Post vital signs: Reviewed and stable  Last Vitals:  Filed Vitals:   02/24/15 0826  BP: 118/80  Pulse: 76  Temp: 36.6 C  Resp: 13    Complications: No apparent anesthesia complications

## 2015-02-25 NOTE — Anesthesia Postprocedure Evaluation (Signed)
Anesthesia Post Note  Patient: Sheila Costa  Procedure(s) Performed: Procedure(s) (LRB): ESOPHAGOGASTRODUODENOSCOPY (EGD) WITH PROPOFOL (N/A)  Patient location during evaluation: Endoscopy Anesthesia Type: MAC Level of consciousness: awake and awake and alert Pain management: pain level controlled Vital Signs Assessment: vitals unstable and post-procedure vital signs reviewed and stable Respiratory status: spontaneous breathing and nonlabored ventilation Anesthetic complications: no    Last Vitals:  Filed Vitals:   02/24/15 1000 02/24/15 1010  BP: 147/97 126/76  Pulse: 90 82  Temp:    Resp: 17 13    Last Pain: There were no vitals filed for this visit.               Jailin Manocchio COKER

## 2015-02-27 ENCOUNTER — Encounter (HOSPITAL_COMMUNITY): Payer: Self-pay | Admitting: Gastroenterology

## 2015-03-10 ENCOUNTER — Ambulatory Visit (HOSPITAL_COMMUNITY): Payer: Medicare Other

## 2015-03-20 ENCOUNTER — Ambulatory Visit (HOSPITAL_COMMUNITY)
Admission: RE | Admit: 2015-03-20 | Discharge: 2015-03-20 | Disposition: A | Discharge status: Home / Self Care | Payer: Medicare Other | Source: Ambulatory Visit | Attending: Gastroenterology | Admitting: Gastroenterology

## 2015-03-20 DIAGNOSIS — R112 Nausea with vomiting, unspecified: Secondary | ICD-10-CM

## 2015-03-22 ENCOUNTER — Encounter (HOSPITAL_BASED_OUTPATIENT_CLINIC_OR_DEPARTMENT_OTHER): Payer: Medicare Other | Attending: Surgery

## 2015-03-22 DIAGNOSIS — G800 Spastic quadriplegic cerebral palsy: Secondary | ICD-10-CM | POA: Insufficient documentation

## 2015-03-22 DIAGNOSIS — Z872 Personal history of diseases of the skin and subcutaneous tissue: Secondary | ICD-10-CM | POA: Insufficient documentation

## 2015-03-22 DIAGNOSIS — Z09 Encounter for follow-up examination after completed treatment for conditions other than malignant neoplasm: Secondary | ICD-10-CM | POA: Insufficient documentation

## 2015-04-12 ENCOUNTER — Other Ambulatory Visit (HOSPITAL_COMMUNITY): Payer: Self-pay | Admitting: General Surgery

## 2015-04-12 DIAGNOSIS — K30 Functional dyspepsia: Secondary | ICD-10-CM

## 2015-04-20 ENCOUNTER — Other Ambulatory Visit (HOSPITAL_COMMUNITY): Payer: Self-pay

## 2015-05-24 ENCOUNTER — Other Ambulatory Visit (HOSPITAL_COMMUNITY): Payer: Self-pay | Admitting: Gastroenterology

## 2015-05-24 DIAGNOSIS — Q401 Congenital hiatus hernia: Secondary | ICD-10-CM

## 2015-05-31 ENCOUNTER — Ambulatory Visit (HOSPITAL_COMMUNITY)
Admission: RE | Admit: 2015-05-31 | Discharge: 2015-05-31 | Disposition: A | Discharge status: Home / Self Care | Payer: Medicare Other | Source: Ambulatory Visit | Attending: Gastroenterology | Admitting: Gastroenterology

## 2015-05-31 ENCOUNTER — Encounter (HOSPITAL_COMMUNITY): Payer: Self-pay

## 2015-05-31 DIAGNOSIS — R933 Abnormal findings on diagnostic imaging of other parts of digestive tract: Secondary | ICD-10-CM | POA: Insufficient documentation

## 2015-05-31 DIAGNOSIS — M419 Scoliosis, unspecified: Secondary | ICD-10-CM | POA: Diagnosis not present

## 2015-05-31 DIAGNOSIS — K449 Diaphragmatic hernia without obstruction or gangrene: Secondary | ICD-10-CM | POA: Diagnosis not present

## 2015-05-31 DIAGNOSIS — Q401 Congenital hiatus hernia: Secondary | ICD-10-CM

## 2015-05-31 MED ORDER — IOPAMIDOL (ISOVUE-300) INJECTION 61%
100.0000 mL | Freq: Once | INTRAVENOUS | Status: AC | PRN
  Administered 2015-05-31: 80 mL via INTRAVENOUS

## 2015-06-25 ENCOUNTER — Emergency Department (HOSPITAL_COMMUNITY)
Admission: EM | Admit: 2015-06-25 | Discharge: 2015-06-25 | Disposition: A | Discharge status: Home / Self Care | Payer: Medicare Other | Attending: Emergency Medicine | Admitting: Emergency Medicine

## 2015-06-25 ENCOUNTER — Encounter (HOSPITAL_COMMUNITY): Payer: Self-pay | Admitting: Emergency Medicine

## 2015-06-25 ENCOUNTER — Emergency Department (HOSPITAL_COMMUNITY): Payer: Medicare Other

## 2015-06-25 DIAGNOSIS — H109 Unspecified conjunctivitis: Secondary | ICD-10-CM | POA: Diagnosis not present

## 2015-06-25 DIAGNOSIS — Z79899 Other long term (current) drug therapy: Secondary | ICD-10-CM | POA: Insufficient documentation

## 2015-06-25 DIAGNOSIS — R509 Fever, unspecified: Secondary | ICD-10-CM

## 2015-06-25 LAB — URINALYSIS, ROUTINE W REFLEX MICROSCOPIC
Bilirubin Urine: NEGATIVE
GLUCOSE, UA: NEGATIVE mg/dL
HGB URINE DIPSTICK: NEGATIVE
Ketones, ur: NEGATIVE mg/dL
Leukocytes, UA: NEGATIVE
Nitrite: NEGATIVE
PROTEIN: NEGATIVE mg/dL
Specific Gravity, Urine: 1.029 (ref 1.005–1.030)
pH: 7.5 (ref 5.0–8.0)

## 2015-06-25 LAB — CBC WITH DIFFERENTIAL/PLATELET
Basophils Absolute: 0 10*3/uL (ref 0.0–0.1)
Basophils Relative: 0 %
EOS PCT: 1 %
Eosinophils Absolute: 0.1 10*3/uL (ref 0.0–0.7)
HCT: 36.9 % (ref 36.0–46.0)
Hemoglobin: 11.8 g/dL — ABNORMAL LOW (ref 12.0–15.0)
LYMPHS ABS: 2 10*3/uL (ref 0.7–4.0)
LYMPHS PCT: 27 %
MCH: 24.8 pg — AB (ref 26.0–34.0)
MCHC: 32 g/dL (ref 30.0–36.0)
MCV: 77.7 fL — AB (ref 78.0–100.0)
MONO ABS: 1.4 10*3/uL — AB (ref 0.1–1.0)
Monocytes Relative: 18 %
Neutro Abs: 4.1 10*3/uL (ref 1.7–7.7)
Neutrophils Relative %: 54 %
PLATELETS: 220 10*3/uL (ref 150–400)
RBC: 4.75 MIL/uL (ref 3.87–5.11)
RDW: 13.1 % (ref 11.5–15.5)
WBC: 7.6 10*3/uL (ref 4.0–10.5)

## 2015-06-25 LAB — BASIC METABOLIC PANEL
Anion gap: 9 (ref 5–15)
BUN: 5 mg/dL — AB (ref 6–20)
CHLORIDE: 105 mmol/L (ref 101–111)
CO2: 23 mmol/L (ref 22–32)
Calcium: 8.8 mg/dL — ABNORMAL LOW (ref 8.9–10.3)
Creatinine, Ser: 0.41 mg/dL — ABNORMAL LOW (ref 0.44–1.00)
GFR calc Af Amer: >60 mL/min (ref >60)
GFR calc non Af Amer: >60 mL/min (ref >60)
GLUCOSE: 93 mg/dL (ref 65–99)
POTASSIUM: 3.7 mmol/L (ref 3.5–5.1)
Sodium: 137 mmol/L (ref 135–145)

## 2015-06-25 MED ORDER — BACITRACIN-POLYMYXIN B 500-10000 UNIT/GM OP OINT
TOPICAL_OINTMENT | Freq: Once | OPHTHALMIC | Status: DC
  Filled 2015-06-25: qty 3.5

## 2015-06-25 MED ORDER — POLYMYXIN B-TRIMETHOPRIM 10000-0.1 UNIT/ML-% OP SOLN
1.0000 [drp] | Freq: Once | OPHTHALMIC | Status: AC
  Administered 2015-06-25: 1 [drp] via OPHTHALMIC
  Filled 2015-06-25: qty 10

## 2015-06-25 MED ORDER — POLYMYXIN B-TRIMETHOPRIM 10000-0.1 UNIT/ML-% OP SOLN: 2.0000 [drp] | OPHTHALMIC | Status: DC

## 2015-06-25 NOTE — ED Notes (Signed)
Per EMS, pt from a group with c/o fever. Per home, fever 102, given 1000 mg tylenol PTA. NAD noted upon arrival.

## 2015-06-25 NOTE — ED Notes (Signed)
Pt's caregiver verbalized understanding of discharge instructions and follow-up care. Pt discharged back to group home with caregiver.

## 2015-06-25 NOTE — Discharge Instructions (Signed)
Bacterial Conjunctivitis Bacterial conjunctivitis (commonly called pink eye) is redness, soreness, or puffiness (inflammation) of the white part of your eye. It is caused by a germ called bacteria. These germs can easily spread from person to person (contagious). Your eye often will become red or pink. Your eye may also become irritated, watery, or have a thick discharge.  HOME CARE   Apply a cool, clean washcloth over closed eyelids. Do this for 10-20 minutes, 3-4 times a day while you have pain.  Gently wipe away any fluid coming from the eye with a warm, wet washcloth or cotton ball.  Wash your hands often with soap and water. Use paper towels to dry your hands.  Do not share towels or washcloths.  Change or wash your pillowcase every day.  Do not use eye makeup until the infection is gone.  Do not use machines or drive if your vision is blurry.  Stop using contact lenses. Do not use them again until your doctor says it is okay.  Do not touch the tip of the eye drop bottle or medicine tube with your fingers when you put medicine on the eye. GET HELP RIGHT AWAY IF:   Your eye is not better after 3 days of starting your medicine.  You have a yellowish fluid coming out of the eye.  You have more pain in the eye.  Your eye redness is spreading.  Your vision becomes blurry.  You have a fever or lasting symptoms for more than 2-3 days.  You have a fever and your symptoms suddenly get worse.  You have pain in the face.  Your face gets red or puffy (swollen). MAKE SURE YOU:   Understand these instructions.  Will watch this condition.  Will get help right away if you are not doing well or get worse.   This information is not intended to replace advice given to you by your health care provider. Make sure you discuss any questions you have with your health care provider.   Document Released: 10/10/2007 Document Revised: 12/18/2011 Document Reviewed: 09/06/2011 Elsevier  Interactive Patient Education 2016 Elsevier Inc.  

## 2015-06-25 NOTE — ED Provider Notes (Signed)
CSN: [id number]     Arrival date & time 06/25/15  1914 History   First MD Initiated Contact with Patient 06/25/15 1916     Chief Complaint  Patient presents with  . Fever   PT IS BROUGHT FROM HER GROUP HOME WITH FEVER.  THE PT HAD A TEMP OF 102.  1 G OF TYLENOL WAS GIVEN.  PT IS NONVERBAL AND IS UNABLE TO GIVE ANY HX.  THERE IS NO ONE WITH HER TO GIVE ADDITIONAL HISTORY.  (Consider location/radiation/quality/duration/timing/severity/associated sxs/prior Treatment) Patient is a 30 y.o. female presenting with fever. The history is provided by the EMS personnel. The history is limited by the condition of the patient.  Fever Max temp prior to arrival:  102 Temp source:  Oral   Past Medical History  Diagnosis Date  . Seizures (HCC)   . Encephalopathy   . Dysphagia   . Pressure ulcer 2008    right lower extremity  . Mental retardation   . Spastic quadriparesis (HCC)   . Seizure disorder (HCC)   . Scoliosis   . Static encephalopathy (HCC)   . Osteopenia   . Dysphagia   . Osteoporosis   . Constipation   . Cerebral palsy (HCC)   . Failure to thrive (0-17)    Past Surgical History  Procedure Laterality Date  . Hardware in back    . Back surgery    . Esophagogastroduodenoscopy (egd) with propofol N/A 02/24/2015    Procedure: ESOPHAGOGASTRODUODENOSCOPY (EGD) WITH PROPOFOL;  Surgeon: Graylin Shiver, MD;  Location: [facility] ENDOSCOPY;  Service: Endoscopy;  Laterality: N/A;   No family history on file. Social History  Substance Use Topics  . Smoking status: Never Smoker   . Smokeless tobacco: Never Used  . Alcohol Use: No   OB History    Gravida Para Term Preterm AB TAB SAB Ectopic Multiple Living   0 0 0 0 0 0 0 0       Review of Systems  Unable to perform ROS: Patient nonverbal  Constitutional: Positive for fever.      Allergies  Review of patient's allergies indicates no known allergies.  Home Medications   Prior to Admission medications   Medication Sig Start Date End  Date Taking? Authorizing Provider  acetaminophen (TYLENOL) 325 MG tablet Take 650 mg by mouth every 4 (four) hours as needed for moderate pain or fever.    Yes Historical Provider, MD  baclofen (LIORESAL) 20 MG tablet Take 10-20 mg by mouth 3 (three) times daily. Take 1 tab (10 mg) in the am, Take 2 tabs (20 mg) at 2 pm and Take 2 tabs (20 mg) at 8 pm.   Yes Historical Provider, MD  benzoyl peroxide 10 % gel Apply 1 application topically 2 (two) times daily.    Yes Historical Provider, MD  calcium carbonate (TUMS - DOSED IN MG ELEMENTAL CALCIUM) 500 MG chewable tablet Chew 1 tablet by mouth daily.    Yes Historical Provider, MD  Carbamazepine (EQUETRO) 300 MG CP12 Take 600-900 mg by mouth 2 (two) times daily. Take 2 capsules (600 mg) in the am and Take 3 capsules (900 mg) in the pm.   Yes Historical Provider, MD  chlorhexidine (PERIDEX) 0.12 % solution Use as directed 10 mLs in the mouth or throat daily.   Yes Historical Provider, MD  diphenhydrAMINE (BENADRYL) 25 MG tablet Take 12.5 mg by mouth 2 (two) times daily.   Yes Historical Provider, MD  levOCARNitine (CARNITOR) 330 MG tablet Take 330  mg by mouth 2 (two) times daily.   Yes Historical Provider, MD  magnesium oxide (MAG-OX) 400 MG tablet Take 400 mg by mouth 3 (three) times daily.   Yes Historical Provider, MD  mupirocin ointment (BACTROBAN) 2 % Place 1 application into the nose 2 (two) times daily as needed (dryness).   Yes Historical Provider, MD  polyethylene glycol (MIRALAX / GLYCOLAX) packet Take 17 g by mouth 2 (two) times daily. Mix with prune juice.   Yes Historical Provider, MD  acetaminophen (TYLENOL) 160 MG/5ML liquid Take 320 mg by mouth every 4 (four) hours as needed for pain.     Historical Provider, MD  alum & mag hydroxide-simeth (MAALOX/MYLANTA) 200-200-20 MG/5ML suspension Take 15 mLs by mouth every 2 (two) hours as needed for indigestion or heartburn.    Historical Provider, MD  bisacodyl (DULCOLAX) 10 MG suppository Place 10  mg rectally as needed for moderate constipation.    Historical Provider, MD  brompheniramine-pseudoephedrine (DIMETAPP) 1-15 MG/5ML ELIX Take 20 mLs by mouth every 4 (four) hours as needed for congestion.    Historical Provider, MD  brompheniramine-pseudoephedrine-dextromethorphan (DIMETAPP DM) 15-1-5 MG/5ML ELIX Take 20 mLs by mouth every 4 (four) hours as needed (nasal congestion).    Historical Provider, MD  cholecalciferol (VITAMIN D) 1000 UNITS tablet Take 2,000 Units by mouth daily.    Historical Provider, MD  ciprofloxacin (CIPRO) 500 MG tablet Take 1 tablet (500 mg total) by mouth 2 (two) times daily. Patient not taking: Reported on 02/14/2015 10/29/14   Eber Hong, MD  collagenase (SANTYL) ointment Apply 1 application topically daily.    Historical Provider, MD  diphenhydrAMINE (BENADRYL) 25 mg capsule Take 25 mg by mouth every 4 (four) hours as needed for itching or allergies.    Historical Provider, MD  guaifenesin (ROBITUSSIN) 100 MG/5ML syrup Take 300 mg by mouth every 4 (four) hours as needed for cough or congestion.    Historical Provider, MD  hydrocortisone cream 1 % Apply 1 application topically daily as needed for itching.    Historical Provider, MD  ibuprofen (ADVIL,MOTRIN) 600 MG tablet Take 600 mg by mouth 3 (three) times daily as needed for mild pain or moderate pain.     Historical Provider, MD  lip balm (CARMEX) ointment Apply 1 application topically daily as needed for lip care.    Historical Provider, MD  loperamide (IMODIUM A-D) 2 MG tablet Take 4 mg by mouth 4 (four) times daily as needed for diarrhea or loose stools.    Historical Provider, MD  magnesium hydroxide (MILK OF MAGNESIA) 400 MG/5ML suspension Take 30 mLs by mouth daily as needed for mild constipation.    Historical Provider, MD  miconazole (NEOSPORIN AF) 2 % cream Apply 1 application topically 2 (two) times daily as needed.    Historical Provider, MD  mineral oil enema Place 1 enema rectally daily as needed  for moderate constipation or severe constipation.    Historical Provider, MD  ondansetron (ZOFRAN ODT) 4 MG disintegrating tablet Take 1 tablet (4 mg total) by mouth every 8 (eight) hours as needed for nausea. 02/15/15   Azalia Bilis, MD  promethazine (PHENERGAN) 12.5 MG suppository Place 1 suppository (12.5 mg total) rectally every 6 (six) hours as needed for nausea or vomiting. 02/15/15   Azalia Bilis, MD  scopolamine (TRANSDERM-SCOP) 1.5 MG Place 1 patch onto the skin every 3 (three) days. Behind ear.    Historical Provider, MD  senna (SENOKOT) 8.6 MG TABS Take 1 tablet by mouth 2 (  two) times daily.     Historical Provider, MD  Skin Protectants, Misc. (EUCERIN) cream Apply 1 application topically daily as needed for dry skin.    Historical Provider, MD  Skin Protectants, Misc. (LIP BALM EX) Apply 1 application topically 3 (three) times daily as needed (chapped lips.).    Historical Provider, MD  Skin Protectants, Misc. (PERIGUARD) OINT Apply 1 application topically 4 (four) times daily as needed.    Historical Provider, MD  Sodium Phosphates (TGT SALINE LAXATIVE RE) Place 1 suppository rectally daily as needed (constipation).    Historical Provider, MD  sorbitol 70 % solution Take 10 mLs by mouth every evening.    Historical Provider, MD  triazolam (HALCION) 0.25 MG tablet Take 0.25 mg by mouth as needed for sleep. Prior to dental procedure.    Historical Provider, MD  trimethoprim-polymyxin b (POLYTRIM) ophthalmic solution Place 2 drops into the right eye every 4 (four) hours. 06/25/15   Jacalyn Lefevre, MD  Valproic Acid (DEPAKENE) 250 MG/5ML SYRP syrup Take 500 mg by mouth 3 (three) times daily.    Historical Provider, MD  Vitamins A & D (VITAMIN A & D) ointment Apply 1 application topically 3 (three) times daily as needed for dry skin.    Historical Provider, MD   BP 119/86 mmHg  Pulse 109  Temp(Src) 98.4 F (36.9 C) (Axillary)  Resp 20  SpO2 100% Physical Exam  Constitutional: She appears  well-developed and well-nourished.  HENT:  Head: Atraumatic.  Right Ear: External ear normal.  Left Ear: External ear normal.  Nose: Nose normal.  Mouth/Throat: Oropharynx is clear and moist.  Eyes: Pupils are equal, round, and reactive to light. Right eye exhibits discharge. Right conjunctiva is injected.  NO CELLULITIS AROUND ORID  Neck: Normal range of motion. Neck supple.  Cardiovascular: Normal rate, regular rhythm, normal heart sounds and intact distal pulses.   Pulmonary/Chest: Effort normal and breath sounds normal.  Abdominal: Soft. Bowel sounds are normal.  Neurological:  PT IS ALERT AND IS AT BASELINE.  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  Skin: Skin is warm and dry.  Nursing note and vitals reviewed.   ED Course  Procedures (including critical care time) Labs Review Labs Reviewed  BASIC METABOLIC PANEL - Abnormal; Notable for the following:    BUN 5 (*)    Creatinine, Ser 0.41 (*)    Calcium 8.8 (*)    All other components within normal limits  CBC WITH DIFFERENTIAL/PLATELET - Abnormal; Notable for the following:    Hemoglobin 11.8 (*)    MCV 77.7 (*)    MCH 24.8 (*)    Monocytes Absolute 1.4 (*)    All other components within normal limits  URINALYSIS, ROUTINE W REFLEX MICROSCOPIC (NOT AT [facility]) - Abnormal; Notable for the following:    APPearance CLOUDY (*)    All other components within normal limits    Imaging Review Dg Chest 2 View  06/25/2015  CLINICAL DATA:  Acute onset of fever. Conjunctivitis. Initial encounter. EXAM: CHEST  2 VIEW COMPARISON:  Chest radiograph performed 10/28/2014 FINDINGS: The lungs are mildly hypoexpanded but appears grossly clear. There is no evidence of focal opacification, pleural effusion or pneumothorax. The cardiomediastinal silhouette is within normal limits. No acute osseous abnormalities are seen. Thoracolumbar spinal fusion hardware is partially imaged. IMPRESSION: Lungs mildly hypoexpanded but grossly clear.  Electronically Signed   By: Roanna Raider M.D.   On: 06/25/2015 20:22   I have personally reviewed and evaluated these images  and lab results as part of my medical decision-making.   EKG Interpretation None      MDM  PT'S CARE GIVER IS WITH PT NOW.  I ALSO SPOKE WITH THE NURSE AT THE GROUP HOME.  I AM GOING TO GIVE RX FOR CONJUNCTIVITIS AND HAVE PT F/U WITH OPTHO. Final diagnoses:  Fever, unspecified fever cause  Conjunctivitis of right eye       Jacalyn LefevreJulie Orissa Arreaga, MD 06/25/15 2233

## 2015-08-25 ENCOUNTER — Emergency Department (HOSPITAL_COMMUNITY)
Admission: EM | Admit: 2015-08-25 | Discharge: 2015-08-25 | Disposition: A | Discharge status: Home / Self Care | Payer: Medicare Other | Attending: Emergency Medicine | Admitting: Emergency Medicine

## 2015-08-25 ENCOUNTER — Encounter (HOSPITAL_COMMUNITY): Payer: Self-pay | Admitting: Emergency Medicine

## 2015-08-25 ENCOUNTER — Emergency Department (HOSPITAL_COMMUNITY): Payer: Medicare Other

## 2015-08-25 DIAGNOSIS — K5900 Constipation, unspecified: Secondary | ICD-10-CM | POA: Insufficient documentation

## 2015-08-25 DIAGNOSIS — Z79899 Other long term (current) drug therapy: Secondary | ICD-10-CM | POA: Diagnosis not present

## 2015-08-25 DIAGNOSIS — R1084 Generalized abdominal pain: Secondary | ICD-10-CM | POA: Diagnosis present

## 2015-08-25 LAB — URINALYSIS, ROUTINE W REFLEX MICROSCOPIC
BILIRUBIN URINE: NEGATIVE
Glucose, UA: NEGATIVE mg/dL
HGB URINE DIPSTICK: NEGATIVE
Ketones, ur: NEGATIVE mg/dL
Leukocytes, UA: NEGATIVE
Nitrite: NEGATIVE
PH: 7.5 (ref 5.0–8.0)
Protein, ur: NEGATIVE mg/dL
SPECIFIC GRAVITY, URINE: 1.024 (ref 1.005–1.030)

## 2015-08-25 LAB — CBC
HEMATOCRIT: 37.1 % (ref 36.0–46.0)
HEMOGLOBIN: 12.3 g/dL (ref 12.0–15.0)
MCH: 25.5 pg — AB (ref 26.0–34.0)
MCHC: 33.2 g/dL (ref 30.0–36.0)
MCV: 76.8 fL — AB (ref 78.0–100.0)
Platelets: 225 10*3/uL (ref 150–400)
RBC: 4.83 MIL/uL (ref 3.87–5.11)
RDW: 13.5 % (ref 11.5–15.5)
WBC: 8.3 10*3/uL (ref 4.0–10.5)

## 2015-08-25 LAB — COMPREHENSIVE METABOLIC PANEL
ALBUMIN: 3.7 g/dL (ref 3.5–5.0)
ALK PHOS: 46 U/L (ref 38–126)
ALT: 16 U/L (ref 14–54)
ANION GAP: 11 (ref 5–15)
AST: 33 U/L (ref 15–41)
BUN: 9 mg/dL (ref 6–20)
CO2: 23 mmol/L (ref 22–32)
Calcium: 8.9 mg/dL (ref 8.9–10.3)
Chloride: 103 mmol/L (ref 101–111)
Creatinine, Ser: 0.38 mg/dL — ABNORMAL LOW (ref 0.44–1.00)
GFR calc Af Amer: >60 mL/min (ref >60)
GFR calc non Af Amer: >60 mL/min (ref >60)
GLUCOSE: 131 mg/dL — AB (ref 65–99)
POTASSIUM: 3.6 mmol/L (ref 3.5–5.1)
SODIUM: 137 mmol/L (ref 135–145)
Total Bilirubin: 0.3 mg/dL (ref 0.3–1.2)
Total Protein: 7.3 g/dL (ref 6.5–8.1)

## 2015-08-25 LAB — LIPASE, BLOOD: Lipase: 22 U/L (ref 11–51)

## 2015-08-25 MED ORDER — DIATRIZOATE MEGLUMINE & SODIUM 66-10 % PO SOLN: 30.0000 mL | Freq: Once | ORAL | Status: DC

## 2015-08-25 MED ORDER — IOPAMIDOL (ISOVUE-300) INJECTION 61%
100.0000 mL | Freq: Once | INTRAVENOUS | Status: AC | PRN
  Administered 2015-08-25: 80 mL via INTRAVENOUS

## 2015-08-25 MED ORDER — SODIUM CHLORIDE 0.9 % IV SOLN: INTRAVENOUS | Status: DC

## 2015-08-25 MED ORDER — SODIUM CHLORIDE 0.9 % IV BOLUS (SEPSIS)
1000.0000 mL | Freq: Once | INTRAVENOUS | Status: AC
  Administered 2015-08-25: 1000 mL via INTRAVENOUS

## 2015-08-25 MED ORDER — ONDANSETRON HCL 4 MG/2ML IJ SOLN
4.0000 mg | Freq: Once | INTRAMUSCULAR | Status: AC
  Administered 2015-08-25: 4 mg via INTRAVENOUS
  Filled 2015-08-25: qty 2

## 2015-08-25 NOTE — ED Notes (Signed)
IV ATTEMPT X2 UNSUCCESSFUL.

## 2015-08-25 NOTE — ED Notes (Signed)
PT CLEANED FROM A VERY LARGE BOWEL MOVEMENT. SOAP ENEMA HELD.

## 2015-08-25 NOTE — ED Notes (Signed)
RN stuck patient x2, Consulting civil engineerCharge RN to start Ultrasound IV

## 2015-08-25 NOTE — ED Triage Notes (Signed)
Per caretaker pt complaint of severe generalized abdominal pain and guarding with emesis onset yesterday. Pt constant belching for past hour.

## 2015-08-25 NOTE — ED Notes (Signed)
PT DISCHARGED. INSTRUCTIONS GIVEN TO CAREGIVER. PT NONVERBAL. PT IN NO APPARENT DISTRESS OR PAIN. THE OPPORTUNITY TO ASK QUESTIONS WAS PROVIDED. 

## 2015-08-25 NOTE — ED Provider Notes (Signed)
[facility]-EMERGENCY DEPT Provider Note   CSN: [id number] Arrival date & time: 08/25/15  4696  First Provider Contact:  None       History   Chief Complaint Chief Complaint  Patient presents with  . Abdominal Pain  . Emesis    HPI Sheila Costa is a 30 y.o. female.  30 year old female who presents with 24 hours of abdominal pain and bilious vomiting according to your caregiver. Abdominal pain has been diffuse and nonlocalizing. No recent medications used. No reported fever although diarrhea that began this evening. Possible history of gallbladder pathology. Patient has a history of cerebral palsy and is nonverbal. No prior abdominal surgical history. Symptoms have been progressively worse and no treatment given prior to arrival nothing makes them better.      Past Medical History:  Diagnosis Date  . Cerebral palsy (HCC)   . Constipation   . Dysphagia   . Dysphagia   . Encephalopathy   . Failure to thrive (0-17)   . Mental retardation   . Osteopenia   . Osteoporosis   . Pressure ulcer 2008   right lower extremity  . Scoliosis   . Seizure disorder (HCC)   . Seizures (HCC)   . Spastic quadriparesis (HCC)   . 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 Mental retardation 01/20/2014  . Constipated 01/20/2014  . Enteritis 01/17/2014  . Possible UTI (lower urinary tract infection) 01/17/2014  . UTI (lower urinary tract infection) 03/18/2011  . Hypernatremia 03/18/2011  . 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 Seizure (HCC) 03/14/2011  . Dysphagia 03/14/2011    Past Surgical History:  Procedure Laterality Date  . BACK SURGERY    . ESOPHAGOGASTRODUODENOSCOPY (EGD) WITH PROPOFOL N/A 02/24/2015   Procedure: ESOPHAGOGASTRODUODENOSCOPY (EGD) WITH PROPOFOL;  Surgeon: Graylin Shiver, MD;  Location: [facility] ENDOSCOPY;  Service: Endoscopy;  Laterality: N/A;  . hardware in back      OB History    Gravida Para Term Preterm AB Living    0 0 0 0 0     SAB TAB Ectopic Multiple Live Births   0 0 0           Home Medications    Prior to Admission medications   Medication Sig Start Date End Date Taking? Authorizing Provider  acetaminophen (TYLENOL) 160 MG/5ML liquid Take 320 mg by mouth every 4 (four) hours as needed for pain.     Historical Provider, MD  acetaminophen (TYLENOL) 325 MG tablet Take 650 mg by mouth every 4 (four) hours as needed for moderate pain or fever.     Historical Provider, MD  alum & mag hydroxide-simeth (MAALOX/MYLANTA) 200-200-20 MG/5ML suspension Take 15 mLs by mouth every 2 (two) hours as needed for indigestion or heartburn.    Historical Provider, MD  baclofen (LIORESAL) 20 MG tablet Take 10-20 mg by mouth 3 (three) times daily. Take 1 tab (10 mg) in the am, Take 2 tabs (20 mg) at 2 pm and Take 2 tabs (20 mg) at 8 pm.    Historical Provider, MD  benzoyl peroxide 10 % gel Apply 1 application topically 2 (two) times daily.     Historical Provider, MD  bisacodyl (DULCOLAX) 10 MG suppository Place 10 mg rectally as needed for moderate constipation.    Historical Provider, MD  brompheniramine-pseudoephedrine (DIMETAPP) 1-15 MG/5ML ELIX Take 20 mLs by mouth every 4 (four) hours as needed for congestion.    Historical Provider,  MD  brompheniramine-pseudoephedrine-dextromethorphan (DIMETAPP DM) 15-1-5 MG/5ML ELIX Take 20 mLs by mouth every 4 (four) hours as needed (nasal congestion).    Historical Provider, MD  calcium carbonate (TUMS - DOSED IN MG ELEMENTAL CALCIUM) 500 MG chewable tablet Chew 1 tablet by mouth daily.     Historical Provider, MD  Carbamazepine (EQUETRO) 300 MG CP12 Take 600-900 mg by mouth 2 (two) times daily. Take 2 capsules (600 mg) in the am and Take 3 capsules (900 mg) in the pm.    Historical Provider, MD  chlorhexidine (PERIDEX) 0.12 % solution Use as directed 10 mLs in the mouth or throat daily.    Historical Provider, MD  cholecalciferol (VITAMIN D) 1000 UNITS tablet Take 2,000 Units  by mouth daily.    Historical Provider, MD  ciprofloxacin (CIPRO) 500 MG tablet Take 1 tablet (500 mg total) by mouth 2 (two) times daily. Patient not taking: Reported on 02/14/2015 10/29/14   Eber Hong, MD  collagenase (SANTYL) ointment Apply 1 application topically daily.    Historical Provider, MD  diphenhydrAMINE (BENADRYL) 25 mg capsule Take 25 mg by mouth every 4 (four) hours as needed for itching or allergies.    Historical Provider, MD  diphenhydrAMINE (BENADRYL) 25 MG tablet Take 12.5 mg by mouth 2 (two) times daily.    Historical Provider, MD  guaifenesin (ROBITUSSIN) 100 MG/5ML syrup Take 300 mg by mouth every 4 (four) hours as needed for cough or congestion.    Historical Provider, MD  hydrocortisone cream 1 % Apply 1 application topically daily as needed for itching.    Historical Provider, MD  ibuprofen (ADVIL,MOTRIN) 600 MG tablet Take 600 mg by mouth 3 (three) times daily as needed for mild pain or moderate pain.     Historical Provider, MD  levOCARNitine (CARNITOR) 330 MG tablet Take 330 mg by mouth 2 (two) times daily.    Historical Provider, MD  lip balm (CARMEX) ointment Apply 1 application topically daily as needed for lip care.    Historical Provider, MD  loperamide (IMODIUM A-D) 2 MG tablet Take 4 mg by mouth 4 (four) times daily as needed for diarrhea or loose stools.    Historical Provider, MD  magnesium hydroxide (MILK OF MAGNESIA) 400 MG/5ML suspension Take 30 mLs by mouth daily as needed for mild constipation.    Historical Provider, MD  magnesium oxide (MAG-OX) 400 MG tablet Take 400 mg by mouth 3 (three) times daily.    Historical Provider, MD  miconazole (NEOSPORIN AF) 2 % cream Apply 1 application topically 2 (two) times daily as needed.    Historical Provider, MD  mineral oil enema Place 1 enema rectally daily as needed for moderate constipation or severe constipation.    Historical Provider, MD  mupirocin ointment (BACTROBAN) 2 % Place 1 application into the nose 2  (two) times daily as needed (dryness).    Historical Provider, MD  ondansetron (ZOFRAN ODT) 4 MG disintegrating tablet Take 1 tablet (4 mg total) by mouth every 8 (eight) hours as needed for nausea. 02/15/15   Azalia Bilis, MD  polyethylene glycol [facility] Ethelene Hal) packet Take 17 g by mouth 2 (two) times daily. Mix with prune juice.    Historical Provider, MD  promethazine (PHENERGAN) 12.5 MG suppository Place 1 suppository (12.5 mg total) rectally every 6 (six) hours as needed for nausea or vomiting. 02/15/15   Azalia Bilis, MD  scopolamine (TRANSDERM-SCOP) 1.5 MG Place 1 patch onto the skin every 3 (three) days. Behind ear.  Historical Provider, MD  senna (SENOKOT) 8.6 MG TABS Take 1 tablet by mouth 2 (two) times daily.     Historical Provider, MD  Skin Protectants, Misc. (EUCERIN) cream Apply 1 application topically daily as needed for dry skin.    Historical Provider, MD  Skin Protectants, Misc. (LIP BALM EX) Apply 1 application topically 3 (three) times daily as needed (chapped lips.).    Historical Provider, MD  Skin Protectants, Misc. (PERIGUARD) OINT Apply 1 application topically 4 (four) times daily as needed.    Historical Provider, MD  Sodium Phosphates (TGT SALINE LAXATIVE RE) Place 1 suppository rectally daily as needed (constipation).    Historical Provider, MD  sorbitol 70 % solution Take 10 mLs by mouth every evening.    Historical Provider, MD  triazolam (HALCION) 0.25 MG tablet Take 0.25 mg by mouth as needed for sleep. Prior to dental procedure.    Historical Provider, MD  trimethoprim-polymyxin b (POLYTRIM) ophthalmic solution Place 2 drops into the right eye every 4 (four) hours. 06/25/15   Jacalyn LefevreJulie Haviland, MD  Valproic Acid (DEPAKENE) 250 MG/5ML SYRP syrup Take 500 mg by mouth 3 (three) times daily.    Historical Provider, MD  Vitamins A & D (VITAMIN A & D) ointment Apply 1 application topically 3 (three) times daily as needed for dry skin.    Historical Provider, MD    Family  History No family history on file.  Social History Social History  Substance Use Topics  . Smoking status: Never Smoker  . Smokeless tobacco: Never Used  . Alcohol use No     Allergies   Review of patient's allergies indicates no known allergies.   Review of Systems Review of Systems  Unable to perform ROS: Acuity of condition     Physical Exam Updated Vital Signs BP 124/82 (BP Location: Right Arm)   Pulse 107   Temp (!) 96.3 F (35.7 C) (Axillary) Comment: pt refuses oral temperature; needs retake with available room  Resp 20   SpO2 100%   Physical Exam  Constitutional: She appears well-developed and well-nourished.  Non-toxic appearance. No distress.  HENT:  Head: Normocephalic and atraumatic.  Eyes: Conjunctivae, EOM and lids are normal. Pupils are equal, round, and reactive to light.  Neck: Normal range of motion. Neck supple. No tracheal deviation present. No thyroid mass present.  Cardiovascular: Normal rate, regular rhythm and normal heart sounds.  Exam reveals no gallop.   No murmur heard. Pulmonary/Chest: Effort normal and breath sounds normal. No stridor. No respiratory distress. She has no decreased breath sounds. She has no wheezes. She has no rhonchi. She has no rales.  Abdominal: Soft. Normal appearance. She exhibits no distension. There is generalized tenderness. There is guarding. There is no rigidity, no rebound and no CVA tenderness.  Musculoskeletal: Normal range of motion. She exhibits no edema or tenderness.  Upper and lower extremity spasms noted  Neurological: She is alert. She displays atrophy. No cranial nerve deficit or sensory deficit. GCS eye subscore is 4. GCS verbal subscore is 2. GCS motor subscore is 5.  Skin: Skin is warm and dry. No abrasion and no rash noted.  Nursing note and vitals reviewed.    ED Treatments / Results  Labs (all labs ordered are listed, but only abnormal results are displayed) Labs Reviewed  LIPASE, BLOOD    COMPREHENSIVE METABOLIC PANEL  CBC  URINALYSIS, ROUTINE W REFLEX MICROSCOPIC (NOT [facility])  CBC WITH DIFFERENTIAL/PLATELET  LIPASE, BLOOD  URINALYSIS, ROUTINE W REFLEX MICROSCOPIC (  NOT AT [facility])    EKG  EKG Interpretation None       Radiology No results found.  Procedures Procedures (including critical care time)  Medications Ordered in ED Medications  ondansetron (ZOFRAN) injection 4 mg (not administered)  0.9 %  sodium chloride infusion (not administered)  sodium chloride 0.9 % bolus 1,000 mL (not administered)     Initial Impression / Assessment and Plan / ED Course  I have reviewed the triage vital signs and the nursing notes.  Pertinent labs & imaging results that were available during my care of the patient were reviewed by me and considered in my medical decision making (see chart for details).  Clinical Course    Patient's abdominal CT consistent with constipation. Fleets enema ordered. She has had no emesis here.  Final Clinical Impressions(s) / ED Diagnoses   Final diagnoses:  None    New Prescriptions New Prescriptions   No medications on file     Lorre Nick, MD 08/25/15 1604

## 2015-11-28 ENCOUNTER — Inpatient Hospital Stay (HOSPITAL_COMMUNITY): Payer: Medicare Other

## 2015-11-28 ENCOUNTER — Emergency Department (HOSPITAL_COMMUNITY): Payer: Medicare Other

## 2015-11-28 ENCOUNTER — Encounter (HOSPITAL_COMMUNITY): Payer: Self-pay | Admitting: Pharmacy Technician

## 2015-11-28 ENCOUNTER — Inpatient Hospital Stay (HOSPITAL_COMMUNITY)
Admission: EM | Admit: 2015-11-28 | Discharge: 2015-11-30 | DRG: 100 | Disposition: A | Discharge status: Home / Self Care | Payer: Medicare Other | Attending: Internal Medicine | Admitting: Internal Medicine

## 2015-11-28 DIAGNOSIS — M419 Scoliosis, unspecified: Secondary | ICD-10-CM | POA: Diagnosis present

## 2015-11-28 DIAGNOSIS — K5909 Other constipation: Secondary | ICD-10-CM | POA: Diagnosis present

## 2015-11-28 DIAGNOSIS — F72 Severe intellectual disabilities: Secondary | ICD-10-CM | POA: Diagnosis present

## 2015-11-28 DIAGNOSIS — Z4659 Encounter for fitting and adjustment of other gastrointestinal appliance and device: Secondary | ICD-10-CM

## 2015-11-28 DIAGNOSIS — F79 Unspecified intellectual disabilities: Secondary | ICD-10-CM

## 2015-11-28 DIAGNOSIS — Z79899 Other long term (current) drug therapy: Secondary | ICD-10-CM | POA: Diagnosis not present

## 2015-11-28 DIAGNOSIS — R40243 Glasgow coma scale score 3-8, unspecified time: Secondary | ICD-10-CM | POA: Diagnosis present

## 2015-11-28 DIAGNOSIS — R569 Unspecified convulsions: Secondary | ICD-10-CM

## 2015-11-28 DIAGNOSIS — R1319 Other dysphagia: Secondary | ICD-10-CM | POA: Diagnosis not present

## 2015-11-28 DIAGNOSIS — R1310 Dysphagia, unspecified: Secondary | ICD-10-CM | POA: Diagnosis present

## 2015-11-28 DIAGNOSIS — G800 Spastic quadriplegic cerebral palsy: Secondary | ICD-10-CM | POA: Diagnosis present

## 2015-11-28 DIAGNOSIS — G40909 Epilepsy, unspecified, not intractable, without status epilepticus: Principal | ICD-10-CM | POA: Diagnosis present

## 2015-11-28 DIAGNOSIS — G8250 Quadriplegia, unspecified: Secondary | ICD-10-CM | POA: Diagnosis present

## 2015-11-28 DIAGNOSIS — M810 Age-related osteoporosis without current pathological fracture: Secondary | ICD-10-CM | POA: Diagnosis present

## 2015-11-28 DIAGNOSIS — Z7401 Bed confinement status: Secondary | ICD-10-CM | POA: Diagnosis not present

## 2015-11-28 LAB — URINE MICROSCOPIC-ADD ON: RBC / HPF: NONE SEEN RBC/hpf (ref 0–5)

## 2015-11-28 LAB — URINALYSIS, ROUTINE W REFLEX MICROSCOPIC
GLUCOSE, UA: NEGATIVE mg/dL
Hgb urine dipstick: NEGATIVE
KETONES UR: NEGATIVE mg/dL
NITRITE: NEGATIVE
PROTEIN: NEGATIVE mg/dL
Specific Gravity, Urine: 1.03 (ref 1.005–1.030)
pH: 6 (ref 5.0–8.0)

## 2015-11-28 LAB — CBC
HEMATOCRIT: 41.1 % (ref 36.0–46.0)
HEMOGLOBIN: 13.5 g/dL (ref 12.0–15.0)
MCH: 25.8 pg — ABNORMAL LOW (ref 26.0–34.0)
MCHC: 32.8 g/dL (ref 30.0–36.0)
MCV: 78.4 fL (ref 78.0–100.0)
Platelets: 202 10*3/uL (ref 150–400)
RBC: 5.24 MIL/uL — AB (ref 3.87–5.11)
RDW: 13.6 % (ref 11.5–15.5)
WBC: 4.7 10*3/uL (ref 4.0–10.5)

## 2015-11-28 LAB — I-STAT TROPONIN, ED: TROPONIN I, POC: 0 ng/mL (ref 0.00–0.08)

## 2015-11-28 LAB — BASIC METABOLIC PANEL
Anion gap: 11 (ref 5–15)
BUN: 11 mg/dL (ref 6–20)
CHLORIDE: 110 mmol/L (ref 101–111)
CO2: 21 mmol/L — ABNORMAL LOW (ref 22–32)
Calcium: 9 mg/dL (ref 8.9–10.3)
Creatinine, Ser: 0.53 mg/dL (ref 0.44–1.00)
GFR calc non Af Amer: >60 mL/min (ref >60)
Glucose, Bld: 70 mg/dL (ref 65–99)
POTASSIUM: 4.1 mmol/L (ref 3.5–5.1)
SODIUM: 142 mmol/L (ref 135–145)

## 2015-11-28 LAB — CBG MONITORING, ED: GLUCOSE-CAPILLARY: 81 mg/dL (ref 65–99)

## 2015-11-28 LAB — MAGNESIUM: MAGNESIUM: 2.1 mg/dL (ref 1.7–2.4)

## 2015-11-28 LAB — VALPROIC ACID LEVEL: VALPROIC ACID LVL: 102 ug/mL — AB (ref 50.0–100.0)

## 2015-11-28 MED ORDER — MAGNESIUM OXIDE 400 (241.3 MG) MG PO TABS
400.0000 mg | ORAL_TABLET | Freq: Three times a day (TID) | ORAL | Status: DC
  Administered 2015-11-29 – 2015-11-30 (×4): 400 mg via NASOGASTRIC
  Filled 2015-11-28 (×4): qty 1

## 2015-11-28 MED ORDER — PERIGUARD EX OINT: 1.0000 "application " | TOPICAL_OINTMENT | Freq: Four times a day (QID) | CUTANEOUS | Status: DC | PRN

## 2015-11-28 MED ORDER — CHLORHEXIDINE GLUCONATE 0.12 % MT SOLN
10.0000 mL | Freq: Every day | OROMUCOSAL | Status: DC
  Administered 2015-11-29 (×2): 10 mL via OROMUCOSAL
  Filled 2015-11-28 (×2): qty 15

## 2015-11-28 MED ORDER — DIPHENHYDRAMINE HCL 12.5 MG/5ML PO ELIX
12.5000 mg | ORAL_SOLUTION | Freq: Two times a day (BID) | ORAL | Status: DC
  Administered 2015-11-29 – 2015-11-30 (×3): 12.5 mg via ORAL
  Filled 2015-11-28 (×3): qty 10

## 2015-11-28 MED ORDER — POLYETHYLENE GLYCOL 3350 17 G PO PACK
17.0000 g | PACK | Freq: Two times a day (BID) | ORAL | Status: DC
Start: 2015-11-29 — End: 2015-11-30
  Administered 2015-11-29 – 2015-11-30 (×2): 17 g via ORAL
  Filled 2015-11-28 (×3): qty 1

## 2015-11-28 MED ORDER — SENNA 8.6 MG PO TABS
1.0000 | ORAL_TABLET | Freq: Two times a day (BID) | ORAL | Status: DC
  Administered 2015-11-29: 8.6 mg via NASOGASTRIC
  Filled 2015-11-28: qty 1

## 2015-11-28 MED ORDER — LEVOCARNITINE 1 GM/10ML PO SOLN
330.0000 mg | Freq: Two times a day (BID) | ORAL | Status: DC
  Administered 2015-11-29 (×2): 330 mg via ORAL
  Filled 2015-11-28 (×4): qty 3.3

## 2015-11-28 MED ORDER — SORBITOL 70 % PO SOLN
10.0000 mL | Freq: Every day | ORAL | Status: DC
  Filled 2015-11-28 (×9): qty 10

## 2015-11-28 MED ORDER — CARBAMAZEPINE ER 200 MG PO CP12: 600.0000 mg | ORAL_CAPSULE | Freq: Every morning | ORAL | Status: DC

## 2015-11-28 MED ORDER — BENZOYL PEROXIDE 10 % EX GEL: 1.0000 "application " | Freq: Two times a day (BID) | CUTANEOUS | Status: DC

## 2015-11-28 MED ORDER — HYDROCORTISONE 1 % EX CREA
1.0000 "application " | TOPICAL_CREAM | CUTANEOUS | Status: DC | PRN
  Filled 2015-11-28: qty 28

## 2015-11-28 MED ORDER — SODIUM CHLORIDE 0.9 % IV SOLN
1500.0000 mg | Freq: Once | INTRAVENOUS | Status: AC
  Administered 2015-11-28: 1500 mg via INTRAVENOUS
  Filled 2015-11-28: qty 15

## 2015-11-28 MED ORDER — VITAMIN D 1000 UNITS PO TABS
2000.0000 [IU] | ORAL_TABLET | Freq: Every day | ORAL | Status: DC
  Administered 2015-11-29 – 2015-11-30 (×2): 2000 [IU] via ORAL
  Filled 2015-11-28 (×2): qty 2

## 2015-11-28 MED ORDER — SODIUM CHLORIDE 0.9 % IV SOLN
500.0000 mg | Freq: Two times a day (BID) | INTRAVENOUS | Status: DC
  Administered 2015-11-29: 500 mg via INTRAVENOUS
  Filled 2015-11-28 (×2): qty 5

## 2015-11-28 MED ORDER — BACLOFEN 5 MG HALF TABLET: 20.0000 mg | ORAL_TABLET | Freq: Two times a day (BID) | ORAL | Status: DC

## 2015-11-28 MED ORDER — VITAMINS A & D EX OINT: 1.0000 "application " | TOPICAL_OINTMENT | Freq: Three times a day (TID) | CUTANEOUS | Status: DC | PRN

## 2015-11-28 MED ORDER — SCOPOLAMINE 1 MG/3DAYS TD PT72
1.0000 | MEDICATED_PATCH | TRANSDERMAL | Status: DC
  Administered 2015-11-29: 1.5 mg via TRANSDERMAL
  Filled 2015-11-28: qty 1

## 2015-11-28 MED ORDER — CALCIUM CARBONATE ANTACID 500 MG PO CHEW
1.0000 | CHEWABLE_TABLET | Freq: Every day | ORAL | Status: DC
  Administered 2015-11-29 – 2015-11-30 (×2): 200 mg via NASOGASTRIC
  Filled 2015-11-28 (×2): qty 1

## 2015-11-28 MED ORDER — MUPIROCIN 2 % EX OINT: 1.0000 "application " | TOPICAL_OINTMENT | Freq: Two times a day (BID) | CUTANEOUS | Status: DC | PRN

## 2015-11-28 MED ORDER — VALPROIC ACID 250 MG/5ML PO SOLN: 10.0000 mL | Freq: Three times a day (TID) | ORAL | Status: DC

## 2015-11-28 MED ORDER — SODIUM CHLORIDE 0.9 % IV SOLN
INTRAVENOUS | Status: DC
  Administered 2015-11-29: 01:00:00 via INTRAVENOUS

## 2015-11-28 NOTE — ED Notes (Signed)
Admitting at bedside 

## 2015-11-28 NOTE — ED Provider Notes (Signed)
Emergency Department Provider Note   I have reviewed the triage vital signs and the nursing notes.   HISTORY  Chief Complaint Seizures   HPI Sheila Costa is a 30 y.o. female with PMH of CP, static encephalopathy, seizure disorder, and MR who presents to the emergency room in for evaluation of breakthrough seizure today. The patient is currently a resident at a local group home. The caregiver at bedside states that she went for a nap Route 1:15pm. They checked her at around 3pm to 3:30 pm and she was unresponsive. No seizure was witnessed. EMS was called and by report the patient had a GCS of 3 on scene. During my interview the caregiver at bedside states that the patient seems to have returned to her mental status baseline. She has been in her normal state of health. No recent fevers. She has been having her normal PO intake and has been compliant with her seizure medication. Last seizure estimated to be approximately 5 years prior.    Past Medical History:  Diagnosis Date  . Cerebral palsy (HCC)   . Constipation   . Dysphagia   . Dysphagia   . Encephalopathy   . Failure to thrive (0-17)   . Mental retardation   . Osteopenia   . Osteoporosis   . Pressure ulcer 2008   right lower extremity  . Scoliosis   . Seizure disorder (HCC)   . Seizures (HCC)   . Spastic quadriparesis (HCC)   . Static encephalopathy     Patient Active Problem List   Diagnosis Date Noted  . Mental retardation 01/20/2014  . Constipated 01/20/2014  . Enteritis 01/17/2014  . Possible UTI (lower urinary tract infection) 01/17/2014  . UTI (lower urinary tract infection) 03/18/2011  . Hypernatremia 03/18/2011  . 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 Seizure (HCC) 03/14/2011  . Dysphagia 03/14/2011    Past Surgical History:  Procedure Laterality Date  . BACK SURGERY    . ESOPHAGOGASTRODUODENOSCOPY (EGD) WITH PROPOFOL N/A 02/24/2015   Procedure: ESOPHAGOGASTRODUODENOSCOPY (EGD) WITH  PROPOFOL;  Surgeon: Graylin ShiverSalem F Ganem, MD;  [facility] ENDOSCOPY;  Service: Endoscopy;  Laterality: N/A;  . hardware in back      Current Outpatient Rx  . Order #: [id number] Class: Historical Med  . Order #: [id number] Class: Historical Med  . Order #: [id number] Class: Historical Med  . Order #: [id number] Class: Historical Med  . Order #: [id number] Class: Historical Med  . Order #: [id number] Class: Historical Med  . Order #: [id number] Class: Historical Med  . Order #: [id number] Class: Historical Med  . Order #: [id number] Class: Historical Med  . Order #: [id number] Class: Historical Med  . Order #: [id number] Class: Historical Med  . Order #: [id number] Class: Historical Med  . Order #: [id number] Class: Historical Med  . Order #: [id number] Class: Historical Med  . Order #: [id number] Class: Historical Med  . Order #: [id number] Class: Historical Med  . Order #: [id number] Class: Historical Med  . Order #: [id number] Class: Historical Med  . Order #: [id number] Class: Historical Med  . Order #: [id number] Class: Print  . Order #: [id number] Class: Historical Med    Allergies Patient has no known allergies.  History reviewed. No pertinent family history.  Social History Social History  Substance Use Topics  . Smoking status: Never Smoker  . Smokeless tobacco: Never Used  . Alcohol use No    Review of Systems  ROS limited by patient's baseline static encephalopathy.   ____________________________________________   PHYSICAL EXAM:  VITAL SIGNS: ED Triage Vitals  Enc Vitals Group  BP 11/28/15 1634 113/82     Pulse Rate 11/28/15 1634 80     Resp 11/28/15 1634 16     Temp 11/28/15 1634 97.7 F (36.5 C)     Temp Source 11/28/15 1634 Oral     SpO2 11/28/15 1634 100 %     Weight 11/28/15 1653 85 lb (38.6 kg)   Constitutional: Alert and looking around the room.  Eyes: Conjunctivae are normal. PERRL.  Head: Atraumatic. Nose: No congestion/rhinnorhea. Mouth/Throat: Mucous membranes are moist.  Oropharynx  non-erythematous. No tongue lacerations.  Neck: No stridor.  Cardiovascular: Normal rate, regular rhythm. Good peripheral circulation. Grossly normal heart sounds.   Respiratory: Normal respiratory effort.  No retractions. Lungs CTAB. Gastrointestinal: Soft and nontender. No distention.  Musculoskeletal: Contracted upper and lower extremities.  Neurologic:  Baseline static encephalopathy. Alert and looking around the room.  Skin:  Skin is warm, dry and intact.   ____________________________________________   LABS (all labs ordered are listed, but only abnormal results are displayed)  Labs Reviewed  VALPROIC ACID LEVEL - Abnormal; Notable for the following:       Result Value   Valproic Acid Lvl 102 (*)    All other components within normal limits  CBC - Abnormal; Notable for the following:    RBC 5.24 (*)    MCH 25.8 (*)    All other components within normal limits  BASIC METABOLIC PANEL - Abnormal; Notable for the following:    CO2 21 (*)    All other components within normal limits  URINALYSIS, ROUTINE W REFLEX MICROSCOPIC (NOT AT [facility]) - Abnormal; Notable for the following:    Bilirubin Urine SMALL (*)    Leukocytes, UA TRACE (*)    All other components within normal limits  URINE MICROSCOPIC-ADD ON - Abnormal; Notable for the following:    Squamous Epithelial / LPF 0-5 (*)    Bacteria, UA FEW (*)    All other components within normal limits  URINE CULTURE  MAGNESIUM  CARBAMAZEPINE LEVEL, TOTAL  VALPROIC ACID LEVEL  CBG MONITORING, ED  I-STAT BETA HCG BLOOD, ED ([facility], [facility], [facility] ONLY)  I-STAT TROPOININ, ED   ____________________________________________  RADIOLOGY  Dg Chest Portable 1 View  Result Date: 11/28/2015 CLINICAL DATA:  Cough, possible seizure.  History of cerebral palsy. EXAM: PORTABLE CHEST 1 VIEW COMPARISON:  Chest radiograph June 25, 2015 FINDINGS: Cardiomediastinal silhouette is normal. Low inspiratory examination. No pleural effusions or focal  consolidations. Trachea projects midline and there is no pneumothorax. Soft tissue planes and included osseous structures are non-suspicious. Thoracolumbar Harrington rods. Scoliosis. IMPRESSION: No acute cardiopulmonary process. Electronically Signed   By: Awilda Metro M.D.   On: 11/28/2015 19:50    ____________________________________________   PROCEDURES  Procedure(s) performed:   Procedures  None ____________________________________________   INITIAL IMPRESSION / ASSESSMENT AND PLAN / ED COURSE  Pertinent labs & imaging results that were available during my care of the patient were reviewed by me and considered in my medical decision making (see chart for details).  Patient presents to the emergency department for evaluation of breakthrough seizure. 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 Plan for baseline labs and valproate level. The patient appears to be managed by her primary care physician with no breakthrough seizures the last 5 years. She has returned her mental status baseline. Afebrile here. No obvious seizure stigmata.   07:00 PM Return to patient bedside update regarding results. The patient is now less responsive. No  obvious seizure activity since my initial evaluation. The patient has remained on monitor but I'm concerned about possible subclinical seizure. Valproate levels are normal to slightly high. Plan for neurology consultation. Will in and out cath for urinalysis and obtain a rectal temperature.   08:00 PM Keppra bolus hanging now. Neurology consult did previously and will see the patient. She is more reactive. Mg and UA pending.   No UTI on UA. Patient remains somnolent. Will place NG tube per Neurology recommendation and plan for EEG in the AM. Patient is protecting her airway with normal O2 saturation.   Discussed patient's case with hospitalist, Dr. Clyde LundborgNiu.  Recommend admission to inpatient, stepdown bed.  I will  place holding orders per their request. Patient and family (if present) updated with plan. Care transferred to hospitalist service.  I reviewed all nursing notes, vitals, pertinent old records, EKGs, labs, imaging (as available).   ____________________________________________  FINAL CLINICAL IMPRESSION(S) / ED DIAGNOSES  Final diagnoses:  Seizures (HCC)  Seizure (HCC)     MEDICATIONS GIVEN DURING THIS VISIT:  Medications  magnesium oxide (MAG-OX) tablet 400 mg (not administered)  Valproic Acid SOLN 500 mg (not administered)  diphenhydrAMINE (BENADRYL) tablet 12.5 mg (not administered)  mupirocin ointment (BACTROBAN) 2 % 1 application (not administered)  hydrocortisone cream 1 % 1 application (not administered)  levOCARNitine (CARNITOR) tablet 330 mg (not administered)  benzoyl peroxide 10 % gel 1 application (not administered)  chlorhexidine (PERIDEX) 0.12 % solution 10 mL (not administered)  PERIGUARD OINT 1 application (not administered)  vitamin A & D ointment 1 application (not administered)  Carbamazepine (Equetro) CP12 600-900 mg (not administered)  sorbitol 70 % solution 10 mL (not administered)  cholecalciferol (VITAMIN D) tablet 2,000 Units (not administered)  baclofen (LIORESAL) tablet 20 mg (not administered)  calcium carbonate (TUMS - dosed in mg elemental calcium) chewable tablet 200 mg of elemental calcium (not administered)  polyethylene glycol (MIRALAX / GLYCOLAX) packet 17 g (not administered)  scopolamine (TRANSDERM-SCOP) 1 MG/3DAYS 1.5 mg (not administered)  senna (SENOKOT) tablet 8.6 mg (not administered)  0.9 %  sodium chloride infusion (not administered)  levETIRAcetam (KEPPRA) 500 mg in sodium chloride 0.9 % 100 mL IVPB (not administered)  levETIRAcetam (KEPPRA) 1,500 mg in sodium chloride 0.9 % 100 mL IVPB (0 mg Intravenous Stopped 11/28/15 2045)     NEW OUTPATIENT MEDICATIONS STARTED DURING THIS VISIT:  None   Note:  This document was prepared  using Dragon voice recognition software and may include unintentional dictation errors.  Alona BeneJoshua Sherrod Toothman, MD Emergency Medicine   Maia PlanJoshua G Hearl Heikes, MD 11/28/15 [phone]

## 2015-11-28 NOTE — ED Notes (Signed)
X-ray at bedside

## 2015-11-28 NOTE — Consult Note (Addendum)
NEURO HOSPITALIST CONSULT NOTE   Requestig physician: Dr. Jacqulyn Bath  Reason for Consult: Breakthrough seizures  History obtained from:  Chart and Caretaker  HPI:                                                                                                                                          Sheila Costa is an 30 y.o. female with severe MR, epilepsy and cerebral palsy with spastic quadriparesis who lives at the [facility] facility. She presents today for suspected breakthrough seizure. The suspected event was unwitnessed; she was found completely unresponsive at the end of her naptime, and was felt by staff to be in a postictal state. Naptime began at 1 PM and she was found at 3:15 PM. Breathing was normal at 8 breaths per minute. She was febrile per EMS with a temp of 100 F. GCS on the scene was 3, also 3 at time of ED arrival.  Her last seizure was about 5 years ago. Her epilepsy is managed with valproic acid administered 4 times per day for a total daily dose of 2000 mg. VPA level in the ED today was 102, slightly above the normal therapeutic range. Also takes carbamazepine 600 mg po qam and 900 mg hs. She has been loaded with Keppra by the ED staff. Also on Baclofen for her spasticity; there is no report of missed Baclofen doses.   GCS was 6 at time of Neurological consultants exam (E1,V1,M4) .   Past Medical History:  Diagnosis Date  . Cerebral palsy (HCC)   . Constipation   . Dysphagia   . Dysphagia   . Encephalopathy   . Failure to thrive (0-17)   . Mental retardation   . Osteopenia   . Osteoporosis   . Pressure ulcer 2008   right lower extremity  . Scoliosis   . Seizure disorder (HCC)   . Seizures (HCC)   . Spastic quadriparesis (HCC)   . Static encephalopathy     Past Surgical History:  Procedure Laterality Date  . BACK SURGERY    . ESOPHAGOGASTRODUODENOSCOPY (EGD) WITH PROPOFOL N/A 02/24/2015   Procedure: ESOPHAGOGASTRODUODENOSCOPY (EGD) WITH  PROPOFOL;  Surgeon: Graylin Shiver, MD;  Location: [facility] ENDOSCOPY;  Service: Endoscopy;  Laterality: N/A;  . hardware in back      History reviewed. No pertinent family history.  Social History:  reports that she has never smoked. She has never used smokeless tobacco. She reports that she does not drink alcohol or use drugs.  No Known Allergies  MEDICATIONS:  Current Meds  Medication Sig  . baclofen (LIORESAL) 10 MG tablet Take 10 mg by mouth every morning.  . baclofen (LIORESAL) 20 MG tablet Take 20 mg by mouth 2 (two) times daily. Pt takes this dose at 2pm and 8pm.  . benzoyl peroxide 10 % gel Apply 1 application topically 2 (two) times daily.   . calcium carbonate (TUMS - DOSED IN MG ELEMENTAL CALCIUM) 500 MG chewable tablet Chew 1 tablet by mouth daily.   . Carbamazepine (EQUETRO) 300 MG CP12 Take 600-900 mg by mouth 2 (two) times daily. Pt takes two capsules in the morning and three capsules in the evening.  . chlorhexidine (PERIDEX) 0.12 % solution Use as directed 10 mLs in the mouth or throat at bedtime.   . cholecalciferol (VITAMIN D) 1000 UNITS tablet Take 2,000 Units by mouth daily.  . diphenhydrAMINE (BENADRYL) 25 MG tablet Take 12.5 mg by mouth 2 (two) times daily.  Marland Kitchen. levOCARNitine (CARNITOR) 330 MG tablet Take 330 mg by mouth 2 (two) times daily.  . magnesium oxide (MAG-OX) 400 (241.3 Mg) MG tablet Take 400 mg by mouth 3 (three) times daily.  . mupirocin ointment (BACTROBAN) 2 % Apply 1 application topically 2 (two) times daily as needed (for dryness). Pt applies to left cheek and periocular area.  . polyethylene glycol (MIRALAX / GLYCOLAX) packet Take 17 g by mouth 2 (two) times daily.   Marland Kitchen. scopolamine (TRANSDERM-SCOP) 1.5 MG Place 1 patch onto the skin every 3 (three) days. Behind ear.  . senna (SENOKOT) 8.6 MG TABS Take 1 tablet by mouth 2 (two) times daily.   .  sorbitol 70 % solution Take 10 mLs by mouth at bedtime.   . Valproate Sodium (VALPROIC ACID) 250 MG/5ML SOLN Take 10 mLs by mouth 3 (three) times daily.    ROS:                                                                                                                                       Unable to obtain ROS due to AMS on a baseline of severe MR.   Blood pressure 113/82, pulse 80, temperature 97.7 F (36.5 C), temperature source Oral, resp. rate 16, weight 38.6 kg (85 lb), last menstrual period 07/26/2015, SpO2 100 %.  Neurologic Examination:                                                                                                      HEENT-  Microcephalic with dysmorphic facies.  Lungs- Respirations unlabored. Slight snoring audible.  Extremities- Warm and well perfused. No edema. Flexion contractures x 4.   Neurological Examination Mental Status: Somnnolent. Arouses to a semi-awake state with noxious stimuli, then rapidly falls back to sleep. No speech output. Does not follow commands during brief period of arousal. Grimaces to noxious. GCS = 6.  Cranial Nerves: II: Unable to assess visual fields; clamps eyes shut when attempting to open manually. PERRL.  III,IV, VI: Normal eyelid strength. Exotropia noted. Unable to test for EOM due to AMS and resistance to doll's eye maneuver.  V,VII: grimaces symmetrically to noxious applied to CNV distribution bilaterally.  VIII: No startle reflex to loud auditory stimulus IX,X: unable to assess XI: Unable to assess XII: Tongue protrudes slightly (chronic) without gross deviation.  Motor: Diffusely decreased bulk symmetrically in upper and lower extremities. Increased flexor tone bilaterally at elbows, wrists, knees and ankles with flexion contractures noted in lower extremities. Flaccid tone of extensors bilaterally. Withdraws with 3/5 strength in lower extremities. Withdraws with 3-4/5 strength in upper extremities.  Sensory: Withdraws  to noxious stimuli applied distally, all 4 extremities.  Deep Tendon Reflexes: 1+ ankles, 1+ patellae, 2+ brachioradialis, 1+ biceps bilaterally. Reflexes are low-amplitude.  Plantars: Right: downgoing   Left: upgoing Cerebellar: Unable to assess due to depressed level of consciousness Gait: Unable to assess.     Lab Results: Basic Metabolic Panel:  Recent Labs Lab 11/28/15 1748  NA 142  K 4.1  CL 110  CO2 21*  GLUCOSE 70  BUN 11  CREATININE 0.53  CALCIUM 9.0    Liver Function Tests: No results for input(s): AST, ALT, ALKPHOS, BILITOT, PROT, ALBUMIN in the last 168 hours. No results for input(s): LIPASE, AMYLASE in the last 168 hours. No results for input(s): AMMONIA in the last 168 hours.  CBC:  Recent Labs Lab 11/28/15 1748  WBC 4.7  HGB 13.5  HCT 41.1  MCV 78.4  PLT 202    Cardiac Enzymes: No results for input(s): CKTOTAL, CKMB, CKMBINDEX, TROPONINI in the last 168 hours.  Lipid Panel: No results for input(s): CHOL, TRIG, HDL, CHOLHDL, VLDL, LDLCALC in the last 168 hours.  CBG:  Recent Labs Lab 11/28/15 1734  GLUCAP 81    Microbiology: Results for orders placed or performed during the hospital encounter of 09/23/14  Urine culture     Status: None   Collection Time: 09/24/14 12:40 AM  Result Value Ref Range Status   Specimen Description URINE, CATHETERIZED  Final   Special Requests NONE  Final   Culture   Final    NO GROWTH 1 DAY Performed at [facility]    Report Status 09/25/2014 FINAL  Final    Coagulation Studies: No results for input(s): LABPROT, INR in the last 72 hours.  Imaging: No results found.   Assessment/Recommendations: 1. Probable unwitnessed seizure with postictal state. Some improvement of most recent GCS (now 6) relative to GCS of 3 on arrival. Last seizure occurred approximately 5 years ago. DDx includes metabolic or other physiological stressor, baclofen withdrawal or breakthrough seizure without any  precipitant.  2. Continue valproic acid and carbamazepine.  3. Agree with Keppra load. Start maintenance dosing at 500 mg IV or PO BID. 4. NPO for now. Will need NGT for medication administration.  5. Carbamazepine level.  6. Repeat valproic acid level tomorrow.  7. EEG in AM.  8. Continue baclofen. Abrupt withdrawal can precipitate seizures.  9. Rule out infection.  10. CT head.  Electronically signed:  Dr. Caryl PinaEric Kyrollos Cordell  11/28/2015, 7:38 PM

## 2015-11-28 NOTE — ED Triage Notes (Signed)
Pt reports to ED via EMS with reports of unresponsiveness believed to be secondary to seizure. Patient with hx of seizures, cerebral palsy, MR and encephalopathy. Pt was laid down for a nap about 1300 and found approx 1515 to be completely unresponsive with spontaneous breathing about 8 breaths per minute. Last seizure approx 5 years ago. Pt was febrile with ems at 100F. GCS with EMS on scene was 3 and upon arrival to the ED, EMS reports GCS of 8.

## 2015-11-28 NOTE — H&P (Signed)
History and Physical    Sheila Costa ZOX:[id number] DOB: 03/25/1985 DOA: 11/28/2015  Referring MD/NP/PA:   PCP: Sheila Costa, Sheila Costa   Patient coming from:  The patient is coming from [facility] facility.  At baseline, pt is dependent for her ADL.  Chief Complaint: Unresponsiveness and possible seizure  HPI: Sheila Costa is a 30 y.o. female with medical history significant of mental retardation, cerebral palsy, spastic quadriplegia, seizure (last seizure was 5 years ago), dysphasia, who presents with unresponsiveness and possible seizure.   Per report and care giver, pt was laid down for a nap about 1300 and found approx 15:15 to be completely unresponsive with spontaneous breathing about 8 breaths per minute. Staff suspected that pt is likely to have had a seizure. Pt was felt by staff to be in a postictal state now.  She was febrile per EMS with a temp of 100 F. GCS on the scene was 3, also 3 at time of ED arrival. Pt has hx of seizure, which is managed with valproic acid administered 4 times per day for a total daily dose of 2000 mg. Pt also takes carbamazepine 600 mg po qam and 900 mg hs. Per care giver, pt dose not seem to have chest pain, abdominal pain, cough, nausea, vomiting or diarrhea. She was loaded with 1500 mg of Keppra in ED.   ED Course: pt was found to have VPA level 102, which is slightly above the normal therapeutic range. WBC 4.7, negative troponin, electrolytes renal function okay, urinalysis with trace of leukocyte, currently temperature 97.7, O2 saturation 98% on room air, blood pressure 101/73, negative chest x-ray.  Review of Systems: Could not be reviewed due to mental retardation and post ictal status.  Allergy: No Known Allergies  Past Medical History:  Diagnosis Date  . Cerebral palsy (HCC)   . Constipation   . Dysphagia   . Dysphagia   . Encephalopathy   . Failure to thrive (0-17)   . Mental retardation   . Osteopenia   . Osteoporosis   . Pressure ulcer  2008   right lower extremity  . Scoliosis   . Seizure disorder (HCC)   . Seizures (HCC)   . Spastic quadriparesis (HCC)   . Static encephalopathy     Past Surgical History:  Procedure Laterality Date  . BACK SURGERY    . ESOPHAGOGASTRODUODENOSCOPY (EGD) WITH PROPOFOL N/A 02/24/2015   Procedure: ESOPHAGOGASTRODUODENOSCOPY (EGD) WITH PROPOFOL;  Surgeon: Sheila ShiverSalem F Ganem, MD;  [facility];  Service: Endoscopy;  Laterality: N/A;  . hardware in back      Social History:  reports that she has never smoked. She has never used smokeless tobacco. She reports that she does not drink alcohol or use drugs.  Family History: Could not be reviewed due to mental retardation and altered mental status.  Prior to Admission medications   Medication Sig Start Date End Date Taking? Authorizing Provider  baclofen (LIORESAL) 10 MG tablet Take 10 mg by mouth every morning.   Yes Historical Provider, MD  baclofen (LIORESAL) 20 MG tablet Take 20 mg by mouth 2 (two) times daily. Pt takes this dose at 2pm and 8pm.   Yes Historical Provider, MD  benzoyl peroxide 10 % gel Apply 1 application topically 2 (two) times daily.    Yes Historical Provider, MD  calcium carbonate (TUMS - DOSED IN MG ELEMENTAL CALCIUM) 500 MG chewable tablet Chew 1 tablet by mouth daily.    Yes Historical Provider, MD  Carbamazepine Conrad Baylis(EQUETRO)  300 MG CP12 Take 600-900 mg by mouth 2 (two) times daily. Pt takes two capsules in the morning and three capsules in the evening.   Yes Historical Provider, MD  chlorhexidine (PERIDEX) 0.12 % solution Use as directed 10 mLs in the mouth or throat at bedtime.    Yes Historical Provider, MD  cholecalciferol (VITAMIN D) 1000 UNITS tablet Take 2,000 Units by mouth daily.   Yes Historical Provider, MD  diphenhydrAMINE (BENADRYL) 25 MG tablet Take 12.5 mg by mouth 2 (two) times daily.   Yes Historical Provider, MD  levOCARNitine (CARNITOR) 330 MG tablet Take 330 mg by mouth 2 (two) times daily.   Yes  Historical Provider, MD  magnesium oxide (MAG-OX) 400 (241.3 Mg) MG tablet Take 400 mg by mouth 3 (three) times daily.   Yes Historical Provider, MD  mupirocin ointment (BACTROBAN) 2 % Apply 1 application topically 2 (two) times daily as needed (for dryness). Pt applies to left cheek and periocular area.   Yes Historical Provider, MD  polyethylene glycol (MIRALAX / GLYCOLAX) packet Take 17 g by mouth 2 (two) times daily.    Yes Historical Provider, MD  scopolamine (TRANSDERM-SCOP) 1.5 MG Place 1 patch onto the skin every 3 (three) days. Behind ear.   Yes Historical Provider, MD  senna (SENOKOT) 8.6 MG TABS Take 1 tablet by mouth 2 (two) times daily.    Yes Historical Provider, MD  sorbitol 70 % solution Take 10 mLs by mouth at bedtime.    Yes Historical Provider, MD  Valproate Sodium (VALPROIC ACID) 250 MG/5ML SOLN Take 10 mLs by mouth 3 (three) times daily.   Yes Historical Provider, MD  diphenhydrAMINE (BENADRYL) 12.5 MG/5ML elixir Take 50 mg by mouth every 4 (four) hours as needed (for nasal congestion).    Historical Provider, MD  hydrocortisone cream 1 % Apply 1 application topically as needed for itching.     Historical Provider, MD  Skin Protectants, Misc. (PERIGUARD) OINT Apply 1 application topically 4 (four) times daily as needed (for rash/irritation).     Historical Provider, MD  trimethoprim-polymyxin b (POLYTRIM) ophthalmic solution Place 2 drops into the right eye every 4 (four) hours. Patient not taking: Reported on 11/28/2015 06/25/15   Jacalyn LefevreJulie Haviland, MD  Vitamins A & D (VITAMIN A & D) ointment Apply 1 application topically 3 (three) times daily as needed for dry skin.    Historical Provider, MD    Physical Exam: Vitals:   11/28/15 2200 11/28/15 2230 11/28/15 2245 11/28/15 2315  BP: 109/80 101/73 101/73 109/77  Pulse: (!) 58 69 79 71  Resp: 14 17 15 16   Temp:      TempSrc:      SpO2: 100% 99% 98% 99%  Weight:       General: Not in acute distress HEENT:       Eyes: PERRL,  EOMI, no scleral icterus.       ENT: No discharge from the ears and nose, no pharynx injection, no tonsillar enlargement.        Neck: No JVD, no bruit, no mass felt. Heme: No neck lymph node enlargement. Cardiac: S1/S2, RRR, No murmurs, No gallops or rubs. Respiratory: No rales, wheezing, rhonchi or rubs. GI: Soft, nondistended, nontender, no rebound pain, no organomegaly, BS present. GU: No hematuria Ext: No pitting leg edema bilaterally. 2+DP/PT pulse bilaterally. Musculoskeletal: No joint deformities, No joint redness or warmth, no limitation of ROM in spin. Skin: No rashes.  Neuro: in post ictal status, not oriented X3,  cranial nerves II-XII grossly intact, quadriplegia Labs on Admission: I have personally reviewed following labs and imaging studies  CBC:  Recent Labs Lab 11/28/15 1748  WBC 4.7  HGB 13.5  HCT 41.1  MCV 78.4  PLT 202   Basic Metabolic Panel:  Recent Labs Lab 11/28/15 1748  NA 142  K 4.1  CL 110  CO2 21*  GLUCOSE 70  BUN 11  CREATININE 0.53  CALCIUM 9.0  MG 2.1   GFR: CrCl cannot be calculated (Unknown ideal weight.). Liver Function Tests: No results for input(s): AST, ALT, ALKPHOS, BILITOT, PROT, ALBUMIN in the last 168 hours. No results for input(s): LIPASE, AMYLASE in the last 168 hours. No results for input(s): AMMONIA in the last 168 hours. Coagulation Profile: No results for input(s): INR, PROTIME in the last 168 hours. Cardiac Enzymes: No results for input(s): CKTOTAL, CKMB, CKMBINDEX, TROPONINI in the last 168 hours. BNP (last 3 results) No results for input(s): PROBNP in the last 8760 hours. HbA1C: No results for input(s): HGBA1C in the last 72 hours. CBG:  Recent Labs Lab 11/28/15 1734  GLUCAP 81   Lipid Profile: No results for input(s): CHOL, HDL, LDLCALC, TRIG, CHOLHDL, LDLDIRECT in the last 72 hours. Thyroid Function Tests: No results for input(s): TSH, T4TOTAL, FREET4, T3FREE, THYROIDAB in the last 72 hours. Anemia  Panel: No results for input(s): VITAMINB12, FOLATE, FERRITIN, TIBC, IRON, RETICCTPCT in the last 72 hours. Urine analysis:    Component Value Date/Time   COLORURINE YELLOW 11/28/2015 2202   APPEARANCEUR CLEAR 11/28/2015 2202   LABSPEC 1.030 11/28/2015 2202   PHURINE 6.0 11/28/2015 2202   GLUCOSEU NEGATIVE 11/28/2015 2202   HGBUR NEGATIVE 11/28/2015 2202   BILIRUBINUR SMALL (A) 11/28/2015 2202   KETONESUR NEGATIVE 11/28/2015 2202   PROTEINUR NEGATIVE 11/28/2015 2202   UROBILINOGEN 0.2 09/24/2014 0040   NITRITE NEGATIVE 11/28/2015 2202   LEUKOCYTESUR TRACE (A) 11/28/2015 2202   Sepsis Labs: @LABRCNTIP (procalcitonin:4,lacticidven:4) )No results found for this or any previous visit (from the past 240 hour(s)).   Radiological Exams on Admission: Dg Chest Portable 1 View  Result Date: 11/28/2015 CLINICAL DATA:  Cough, possible seizure.  History of cerebral palsy. EXAM: PORTABLE CHEST 1 VIEW COMPARISON:  Chest radiograph June 25, 2015 FINDINGS: Cardiomediastinal silhouette is normal. Low inspiratory examination. No pleural effusions or focal consolidations. Trachea projects midline and there is no pneumothorax. Soft tissue planes and included osseous structures are non-suspicious. Thoracolumbar Harrington rods. Scoliosis. IMPRESSION: No acute cardiopulmonary process. Electronically Signed   By: Awilda Metro Costa.D.   On: 11/28/2015 19:50     EKG: Independently reviewed. Sinus rhythm, QTC 403, mild T-wave inversion in V1-V3, right axis deviation   Assessment/Plan Principal Problem:   Seizure (HCC) Active Problems:   Spastic quadriplegia (HCC)   Mental retardation   Seizure [facility]): Patient seems to have had breakthrough seizures. Her last visit was 5 years ago. Neurology, Dr. Otelia Limes was consulted.  -will admit to SDU as inpt -highly appreciate Neurology's consultation and will follow-up recommendations as follows: 1. Seizure precaution 2. Continue valproic acid and  carbamazepine.  3. Agree with Keppra load. Start maintenance dosing at 500 mg IV or PO BID. 4. NPO for now. Will need NGT for medication administration.  5. Carbamazepine level.  6. Repeat valproic acid level tomorrow.  7. EEG in AM.  8. Continue baclofen. Abrupt withdrawal can precipitate seizures.  9. Rule out infection.-->CXR negative. UA with trace amount leukocyte-->will get urine culture 10. CT head.  Spastic quadriplegia (HCC): -Continue  home baclofen   DVT ppx: SQ Lovenox Code Status: Full code Family Communication: Yes, patient's care giver at bed side Disposition Plan:  Anticipate discharge back to previous [facility] facility  Consults called:   Neurology, Dr. Otelia Limes Admission status:  SDU/inpation       Date of Service 11/28/2015    Lorretta Harp [facility] Pager [phone]  If 7PM-7AM, please contact night-coverage [email or web address] Password [facility] 11/28/2015, 11:38 PM

## 2015-11-29 ENCOUNTER — Inpatient Hospital Stay (HOSPITAL_COMMUNITY): Payer: Medicare Other

## 2015-11-29 DIAGNOSIS — Z4659 Encounter for fitting and adjustment of other gastrointestinal appliance and device: Secondary | ICD-10-CM

## 2015-11-29 DIAGNOSIS — G8250 Quadriplegia, unspecified: Secondary | ICD-10-CM

## 2015-11-29 DIAGNOSIS — R569 Unspecified convulsions: Secondary | ICD-10-CM

## 2015-11-29 DIAGNOSIS — R1319 Other dysphagia: Secondary | ICD-10-CM

## 2015-11-29 LAB — CARBAMAZEPINE LEVEL, TOTAL: CARBAMAZEPINE LVL: 4.2 ug/mL (ref 4.0–12.0)

## 2015-11-29 LAB — HCG, QUANTITATIVE, PREGNANCY

## 2015-11-29 LAB — VALPROIC ACID LEVEL: Valproic Acid Lvl: 91 ug/mL (ref 50.0–100.0)

## 2015-11-29 MED ORDER — BACLOFEN 5 MG HALF TABLET
20.0000 mg | ORAL_TABLET | Freq: Two times a day (BID) | ORAL | Status: DC
  Administered 2015-11-29: 20 mg via NASOGASTRIC
  Filled 2015-11-29 (×2): qty 4

## 2015-11-29 MED ORDER — SORBITOL 70 % SOLN
10.0000 mL | Freq: Every day | Status: DC
  Administered 2015-11-29: 10 mL via ORAL
  Filled 2015-11-29: qty 30

## 2015-11-29 MED ORDER — CARBAMAZEPINE ER 400 MG PO TB12: 600.0000 mg | ORAL_TABLET | Freq: Every day | ORAL | Status: DC

## 2015-11-29 MED ORDER — ACETAMINOPHEN 325 MG PO TABS
650.0000 mg | ORAL_TABLET | Freq: Four times a day (QID) | ORAL | Status: DC | PRN
  Administered 2015-11-29: 650 mg via ORAL
  Filled 2015-11-29: qty 2

## 2015-11-29 MED ORDER — BACLOFEN 5 MG HALF TABLET
20.0000 mg | ORAL_TABLET | Freq: Two times a day (BID) | ORAL | Status: DC
  Administered 2015-11-29 (×2): 20 mg via ORAL
  Filled 2015-11-29 (×3): qty 4

## 2015-11-29 MED ORDER — ENOXAPARIN SODIUM 30 MG/0.3ML ~~LOC~~ SOLN
30.0000 mg | SUBCUTANEOUS | Status: DC
  Administered 2015-11-29 – 2015-11-30 (×2): 30 mg via SUBCUTANEOUS
  Filled 2015-11-29 (×2): qty 0.3

## 2015-11-29 MED ORDER — CARBAMAZEPINE ER 200 MG PO TB12
600.0000 mg | ORAL_TABLET | Freq: Every day | ORAL | Status: DC
  Administered 2015-11-29 – 2015-11-30 (×2): 600 mg via ORAL
  Filled 2015-11-29 (×2): qty 3

## 2015-11-29 MED ORDER — RESOURCE THICKENUP CLEAR PO POWD
ORAL | Status: DC | PRN
  Filled 2015-11-29: qty 125

## 2015-11-29 MED ORDER — VALPROATE SODIUM 250 MG/5ML PO SOLN
500.0000 mg | Freq: Three times a day (TID) | ORAL | Status: DC
  Administered 2015-11-29 – 2015-11-30 (×5): 500 mg via ORAL
  Filled 2015-11-29 (×5): qty 10

## 2015-11-29 MED ORDER — CARBAMAZEPINE ER 200 MG PO CP12
800.0000 mg | ORAL_CAPSULE | Freq: Every evening | ORAL | Status: DC
  Filled 2015-11-29: qty 4

## 2015-11-29 MED ORDER — CARBAMAZEPINE ER 200 MG PO TB12
900.0000 mg | ORAL_TABLET | Freq: Every day | ORAL | Status: DC
  Administered 2015-11-29: 900 mg via ORAL
  Filled 2015-11-29: qty 1

## 2015-11-29 MED ORDER — CARBAMAZEPINE ER 400 MG PO TB12
600.0000 mg | ORAL_TABLET | Freq: Every day | ORAL | Status: DC
  Filled 2015-11-29: qty 1

## 2015-11-29 MED ORDER — CARBAMAZEPINE ER 400 MG PO TB12
800.0000 mg | ORAL_TABLET | Freq: Every day | ORAL | Status: DC
  Administered 2015-11-29: 800 mg via ORAL
  Filled 2015-11-29: qty 2

## 2015-11-29 MED ORDER — VALPROATE SODIUM 250 MG/5ML PO SOLN: 500.0000 mg | Freq: Three times a day (TID) | ORAL | Status: DC

## 2015-11-29 MED ORDER — CARBAMAZEPINE ER 200 MG PO CP12: 800.0000 mg | ORAL_CAPSULE | Freq: Every evening | ORAL | Status: DC

## 2015-11-29 MED ORDER — SENNA 8.6 MG PO TABS
1.0000 | ORAL_TABLET | Freq: Two times a day (BID) | ORAL | Status: DC
  Administered 2015-11-29 – 2015-11-30 (×2): 8.6 mg via ORAL
  Filled 2015-11-29 (×2): qty 1

## 2015-11-29 MED ORDER — ONDANSETRON HCL 4 MG/2ML IJ SOLN: 4.0000 mg | Freq: Four times a day (QID) | INTRAMUSCULAR | Status: DC | PRN

## 2015-11-29 MED ORDER — ENSURE ENLIVE PO LIQD: 237.0000 mL | Freq: Two times a day (BID) | ORAL | Status: DC

## 2015-11-29 MED ORDER — ALBUTEROL SULFATE (2.5 MG/3ML) 0.083% IN NEBU: 2.5000 mg | INHALATION_SOLUTION | RESPIRATORY_TRACT | Status: DC | PRN

## 2015-11-29 MED ORDER — CARBAMAZEPINE ER 400 MG PO TB12: 800.0000 mg | ORAL_TABLET | Freq: Every day | ORAL | Status: DC

## 2015-11-29 NOTE — Progress Notes (Signed)
Initial Nutrition Assessment  DOCUMENTATION CODES:   Underweight  INTERVENTION:   - Encourage PO intake. - Provide Provide Magic Cup oral nutrition supplement TID with meals. Each provides 290 kcal and 9 grams protein.  NUTRITION DIAGNOSIS:   Increased nutrient needs related to wound healing as evidenced by estimated needs.  GOAL:   Patient will meet greater than or equal to 90% of their needs  MONITOR:   PO intake, Supplement acceptance, Weight trends, Skin  REASON FOR ASSESSMENT:   Low Braden   ASSESSMENT:   30 y.o. female with medical history significant of mental retardation, cerebral palsy, spastic quadriplegia, seizure (last seizure was 5 years ago), dysphasia, who presents with unresponsiveness and possible seizure.   Spoke with pt's case worker at bedside who reports pt's typical diet is pureed with nectar-thick liquids. Pt's case worker reports pt consumes most or all of all meals or snacks PTA and that she consumes 3 meals per day. Pt's case worker reports pt consumes Benecalorie oral nutrition supplement at least once daily. Will order Ensure Enlive oral nutrition supplement BID.  Pt's case worker reports pt has been at a low weight for "months" and that the goal is for the pt to gain weight. Per H&P, pt is wheelchair/bedbound at baseline.  Medications reviewed and include calcium carbonate daily, 2,000 units cholecalciferol daily, 30 mg Lovenox daily, 400 mg magnesium oxide TID, 17 g Miralax BID, Senokot daily  Labs reviewed. CBG: 81 mg/dL.  NFPE: Exam completed. Mild fat depletion, severe muscle depletion, and no edema noted.  Diet Order:  DIET - DYS 1 Room service appropriate? Yes; Fluid consistency: Honey Thick  Skin:  Wound (see comment) (stage II pressure injury to sacrum)  Last BM:  PTA  Height:   Ht Readings from Last 1 Encounters:  02/24/15 5\' 4"  (1.626 m)    Weight:   Wt Readings from Last 1 Encounters:  11/28/15 85 lb (38.6 kg)    Ideal  Body Weight:  49 kg (adjusted for quadriplegia)  BMI:  Body mass index is 14.59 kg/m.  Estimated Nutritional Needs:   Kcal:  1200-1400 (30-35 kcal/kg)  Protein:  55-65 grams  Fluid:  >/= 1.4 L/day  EDUCATION NEEDS:   No education needs identified at this time  Rosemarie AxKate Izzac Rockett Dietetic Intern Pager Number: [phone]

## 2015-11-29 NOTE — Evaluation (Signed)
Clinical/Bedside Swallow Evaluation Patient Details  Name: Sheila RossettiMichelle A Cutrone MRN: [id number] Date of Birth: 10/02/1985  Today's Date: 11/29/2015 Time: SLP Start Time (ACUTE ONLY): 1006 SLP Stop Time (ACUTE ONLY): 1032 SLP Time Calculation (min) (ACUTE ONLY): 26 min  Past Medical History:  Past Medical History:  Diagnosis Date  . Cerebral palsy (HCC)   . Constipation   . Dysphagia   . Dysphagia   . Encephalopathy   . Failure to thrive (0-17)   . Mental retardation   . Osteopenia   . Osteoporosis   . Pressure ulcer 2008   right lower extremity  . Scoliosis   . Seizure disorder (HCC)   . Seizures (HCC)   . Spastic quadriparesis (HCC)   . Static encephalopathy    Past Surgical History:  Past Surgical History:  Procedure Laterality Date  . BACK SURGERY    . ESOPHAGOGASTRODUODENOSCOPY (EGD) WITH PROPOFOL N/A 02/24/2015   Procedure: ESOPHAGOGASTRODUODENOSCOPY (EGD) WITH PROPOFOL;  Surgeon: Graylin ShiverSalem F Ganem, MD;  [facility];  Service: Endoscopy;  Laterality: N/A;  . hardware in back     HPI:  Sheila DusterMichelle A Braddyis a 30 y.o.femalewith medical history significant ofmental retardation, cerebral palsy, spastic quadriplegia, seizure (last seizure was 5 years ago),dysphasia, who presents with unresponsiveness and possible seizure.    Assessment / Plan / Recommendation Clinical Impression  Pt demonstrates tolerance of pureed solids and honey thick liquids via spoon, consistent with her Case manager's report of baseline function. Pt has reduced oral control of bolus with tongue thrusting for transit and swallow trigger, which is quite delayed. No signs of aspiration, pt eager to eat. Given these findings puree and honey thick liquids via teaspoon are reasonable diet modifications. Recommend pt resume diet with total assist for feeding purees and liquids with a spoon. No SLP f/u needed. Will sign off.     Aspiration Risk  Mild aspiration risk    Diet Recommendation Dysphagia 1  (Puree);Honey-thick liquid   Liquid Administration via: Spoon Medication Administration: Crushed with puree Supervision: Staff to assist with self feeding;Full supervision/cueing for compensatory strategies Compensations: Slow rate;Small sips/bites Postural Changes: Seated upright at 90 degrees    Other  Recommendations Oral Care Recommendations: Oral care BID Other Recommendations: Order thickener from pharmacy   Follow up Recommendations 24 hour supervision/assistance      Frequency and Duration            Prognosis        Swallow Study   General HPI: Sheila MinaMichelle A Braddyis a 30 y.o.femalewith medical history significant ofmental retardation, cerebral palsy, spastic quadriplegia, seizure (last seizure was 5 years ago),dysphasia, who presents with unresponsiveness and possible seizure.  Type of Study: Bedside Swallow Evaluation Diet Prior to this Study: Dysphagia 1 (puree);Honey-thick liquids Temperature Spikes Noted: No Respiratory Status: Room air History of Recent Intubation: No Behavior/Cognition: Alert;Cooperative;Doesn't follow directions Oral Cavity Assessment: Within Functional Limits Oral Care Completed by SLP: No Oral Cavity - Dentition: Poor condition Self-Feeding Abilities: Total assist Patient Positioning: Upright in bed Baseline Vocal Quality: Normal Volitional Cough: Cognitively unable to elicit Volitional Swallow: Unable to elicit    Oral/Motor/Sensory Function Overall Oral Motor/Sensory Function: Moderate impairment (decreased labial seal) Facial Symmetry: Within Functional Limits Lingual ROM: Other (Comment) (minimal lateral movement)   Ice Chips     Thin Liquid Thin Liquid: Not tested    Nectar Thick Nectar Thick Liquid: Not tested   Honey Thick Honey Thick Liquid: Impaired Presentation: Spoon Oral Phase Impairments: Reduced labial seal;Reduced lingual movement/coordination Oral  Phase Functional Implications: Prolonged oral transit Pharyngeal  Phase Impairments: Suspected delayed Swallow   Puree Puree: Impaired Presentation: Spoon Oral Phase Impairments: Reduced lingual movement/coordination;Reduced labial seal Oral Phase Functional Implications: Prolonged oral transit Pharyngeal Phase Impairments: Suspected delayed Swallow   Solid   GO   Solid: Not tested       Harlon DittyBonnie Padraic Marinos, MA CCC-SLP [phone]  Maia Handa, Riley NearingBonnie Caroline 11/29/2015,10:37 AM

## 2015-11-29 NOTE — Progress Notes (Signed)
Routine EEG completed, results pending. 

## 2015-11-29 NOTE — ED Notes (Addendum)
Pt back from CT.  Pt awake, smiling, responding to stimuli.  CT staff states patient became more aware and alert while being moved to the scanner.  Home care at bedside states back to baseline.  MD aware.  Will speak to admitting.

## 2015-11-29 NOTE — Progress Notes (Signed)
Left message for Sheila Costa to call back on both numbers from chart, awaiting callback.

## 2015-11-29 NOTE — Care Management Note (Signed)
Case Management Note  Patient Details  Name: Sheila Costa MRN: [id number] Date of Birth: 12/08/1985  Subjective/Objective:                 Spoke with Selinda FlavinJackie Richardson, POA. She states that patent resides at [facility], [address] She receives 24 hour care there. Patient gets a pureed diet, and requires pills to be crushed and to be fed. Patient's caregiver at group home is Randa EvensSimone, she can be reached at [phone].   Action/Plan:  Anticipate patient to return to group home when medically cleared.    Expected Discharge Date:                  Expected Discharge Plan:  Group Home  In-House Referral:  Clinical Social Work  Discharge planning Services  CM Consult  Post Acute Care Choice:    Choice offered to:     DME Arranged:    DME Agency:     HH Arranged:    HH Agency:     Status of Service:  Completed, signed off  If discussed at [employer] Meetings, dates discussed:    Additional Comments:  Lawerance SabalDebbie Finbar Nippert, RN 11/29/2015, 12:37 PM

## 2015-11-29 NOTE — Progress Notes (Signed)
PROGRESS NOTE        PATIENT DETAILS Name: Sheila Costa Age: 30 y.o. Sex: female Date of Birth: 03-Nov-1985 Admit Date: 11/28/2015 Admitting Physician Lorretta Harp, MD JXB:JYNWGN, Gretta Began  Brief Narrative: Patient is a 30 y.o. female with past medical history of spastic quadriplegia, severe mental retardation, chronic constipation, prior history of seizure disorder who was transferred from her facility for possible breakthrough seizures and postictal state. Patient was subsequently admitted for further evaluation and treatment.  Subjective: Awake, does not follow commands. Per RN-who spoke with group home social work/case management who was at bedside earlier today-patient is back to her usual baseline.  Assessment/Plan: Probable breakthrough seizure with postictal state: Neurology following and adjusting antiseizure medications. CT head without any acute findings. Awaiting EEG.  Suspected chronic dysphagia: Seen by speech therapy today, recommendations are for dysphagia 1 diet (per speech therapy note-likely baseline diet). I have asked RN to discontinue NG tube.  History of chronic constipation: Continue current bowel regimen.  History of mental retardation/spastic quadriplegia: Lives in a group home, per group homes social work/case management (present at bedside earlier today)-she is back to her usual baseline.  DVT Prophylaxis: Prophylactic Lovenox   Code Status: Full code   Family Communication: None at bedside  Disposition Plan: Remain inpatient-back to group home on discharge-likely 11/16  Antimicrobial agents: None  Procedures: None  CONSULTS:  neurology  Time spent: 25- minutes-Greater than 50% of this time was spent in counseling, explanation of diagnosis, planning of further management, and coordination of care.  MEDICATIONS: Anti-infectives    None      Scheduled Meds: . baclofen  20 mg Oral BID  . calcium carbonate  1  tablet Per NG tube Daily  . carbamazepine  600 mg Oral Daily   And  . carbamazepine  900 mg Oral QHS  . chlorhexidine  10 mL Mouth/Throat QHS  . cholecalciferol  2,000 Units Oral Daily  . diphenhydrAMINE  12.5 mg Oral BID  . enoxaparin (LOVENOX) injection  30 mg Subcutaneous Q24H  . levOCARNitine  330 mg Oral BID  . magnesium oxide  400 mg Per NG tube TID  . polyethylene glycol  17 g Oral BID  . scopolamine  1 patch Transdermal Q72H  . senna  1 tablet Per NG tube BID  . sorbitol  10 mL Oral QHS  . Valproate Sodium  500 mg Oral TID   Continuous Infusions: PRN Meds:.acetaminophen, hydrocortisone cream, mupirocin ointment, RESOURCE THICKENUP CLEAR   PHYSICAL EXAM: Vital signs: Vitals:   11/29/15 0030 11/29/15 0045 11/29/15 0114 11/29/15 0558  BP: 102/72 101/65 116/77 116/73  Pulse:   94 86  Resp: 26 18 18 17   Temp:   97.6 F (36.4 C) (!) 96 F (35.6 C)  TempSrc:   Oral Axillary  SpO2:   98% 100%  Weight:       Filed Weights   11/28/15 1653  Weight: 38.6 kg (85 lb)   Body mass index is 14.59 kg/m.   General appearance :Awake-does not follow commands. Chronically ill appearing. Eyes:, pupils equally reactive to light and accomodation,no scleral icterus.Pink conjunctiva HEENT: Atraumatic and Normocephalic Neck: supple, no JVD.  Resp:Good air entry bilaterally, no added sounds  CVS: S1 S2 regular, GI: Bowel sounds present, Non tender and not distended Extremities: B/L Lower Ext shows no edema, both legs are warm  to touch Neurology:  Non focal-seems to be moving all 4 ext Musculoskeletal:No digital cyanosis Skin:No Rash, warm and dry Wounds:N/A  I have personally reviewed following labs and imaging studies  LABORATORY DATA: CBC:  Recent Labs Lab 11/28/15 1748  WBC 4.7  HGB 13.5  HCT 41.1  MCV 78.4  PLT 202    Basic Metabolic Panel:  Recent Labs Lab 11/28/15 1748  NA 142  K 4.1  CL 110  CO2 21*  GLUCOSE 70  BUN 11  CREATININE 0.53  CALCIUM 9.0    MG 2.1    GFR: CrCl cannot be calculated (Unknown ideal weight.).  Liver Function Tests: No results for input(s): AST, ALT, ALKPHOS, BILITOT, PROT, ALBUMIN in the last 168 hours. No results for input(s): LIPASE, AMYLASE in the last 168 hours. No results for input(s): AMMONIA in the last 168 hours.  Coagulation Profile: No results for input(s): INR, PROTIME in the last 168 hours.  Cardiac Enzymes: No results for input(s): CKTOTAL, CKMB, CKMBINDEX, TROPONINI in the last 168 hours.  BNP (last 3 results) No results for input(s): PROBNP in the last 8760 hours.  HbA1C: No results for input(s): HGBA1C in the last 72 hours.  CBG:  Recent Labs Lab 11/28/15 1734  GLUCAP 81    Lipid Profile: No results for input(s): CHOL, HDL, LDLCALC, TRIG, CHOLHDL, LDLDIRECT in the last 72 hours.  Thyroid Function Tests: No results for input(s): TSH, T4TOTAL, FREET4, T3FREE, THYROIDAB in the last 72 hours.  Anemia Panel: No results for input(s): VITAMINB12, FOLATE, FERRITIN, TIBC, IRON, RETICCTPCT in the last 72 hours.  Urine analysis:    Component Value Date/Time   COLORURINE YELLOW 11/28/2015 2202   APPEARANCEUR CLEAR 11/28/2015 2202   LABSPEC 1.030 11/28/2015 2202   PHURINE 6.0 11/28/2015 2202   GLUCOSEU NEGATIVE 11/28/2015 2202   HGBUR NEGATIVE 11/28/2015 2202   BILIRUBINUR SMALL (A) 11/28/2015 2202   KETONESUR NEGATIVE 11/28/2015 2202   PROTEINUR NEGATIVE 11/28/2015 2202   UROBILINOGEN 0.2 09/24/2014 0040   NITRITE NEGATIVE 11/28/2015 2202   LEUKOCYTESUR TRACE (A) 11/28/2015 2202    Sepsis Labs: Lactic Acid, Venous    Component Value Date/Time   LATICACIDVEN 2.16 01/17/2014 2226    MICROBIOLOGY: No results found for this or any previous visit (from the past 240 hour(s)).  RADIOLOGY STUDIES/RESULTS: Ct Head Wo Contrast  Result Date: 11/29/2015 CLINICAL DATA:  Possible seizure. Unresponsive nodes. Patient's history is seizures, cerebral palsy, mental retardation  cephalad of the. EXAM: CT HEAD WITHOUT CONTRAST TECHNIQUE: Contiguous axial images were obtained from the base of the skull through the vertex without intravenous contrast. COMPARISON:  None. FINDINGS: Brain: There is mild prominence of the lateral ventricles with contour undulations along the periphery of the lateral ventricles which may represent periventricular leukomalacia from chronic remote vascular insults possibly upper. No large vascular territory infarction is seen. No hemorrhage or midline shift. No edema. Fourth ventricle is midline. Vascular: No hyperdense vessel or unexpected calcification. Skull: There is diffuse calvarial thickening likely related to anti seizure medications such as Dilantin. Sinuses/Orbits: The frontal sinus is well-aerated. There is a concretion from inspissated mucus in the right frontal sinus. No acute sinusitis. The ethmoid, maxillary and sphenoid sinuses are clear. The mastoids are well-aerated. Other: None IMPRESSION: Diffuse calvarial thickening which can be seen with anti seizure medication such as Dilantin. Periventricular leukomalacia with mild ventriculomegaly. Findings may be related to old remote vascular insults. No acute intracranial abnormality identified. Electronically Signed   By: Rene Kocheravid  Kwon M.D.  On: 11/29/2015 00:27   Dg Chest Portable 1 View  Result Date: 11/28/2015 CLINICAL DATA:  Cough, possible seizure.  History of cerebral palsy. EXAM: PORTABLE CHEST 1 VIEW COMPARISON:  Chest radiograph June 25, 2015 FINDINGS: Cardiomediastinal silhouette is normal. Low inspiratory examination. No pleural effusions or focal consolidations. Trachea projects midline and there is no pneumothorax. Soft tissue planes and included osseous structures are non-suspicious. Thoracolumbar Harrington rods. Scoliosis. IMPRESSION: No acute cardiopulmonary process. Electronically Signed   By: Awilda Metroourtnay  Bloomer M.D.   On: 11/28/2015 19:50   Dg Abd Portable 1v  Result Date:  11/29/2015 CLINICAL DATA:  Nasogastric tube placement EXAM: PORTABLE ABDOMEN - 1 VIEW COMPARISON:  None. FINDINGS: The nasogastric tube extends into the stomach. Abdominal gas pattern is unremarkable. Extensive spinal fixation hardware noted. IMPRESSION: Nasogastric tube extends into the stomach. Electronically Signed   By: Ellery Plunkaniel R Mitchell M.D.   On: 11/29/2015 03:52     LOS: 1 day   Jeoffrey MassedGHIMIRE,SHANKER, MD  [facility] Pager:[phone]  If 7PM-7AM, please contact night-coverage [email or web address] Password [email or web address] 11/29/2015, 11:33 AM

## 2015-11-29 NOTE — Procedures (Signed)
ELECTROENCEPHALOGRAM REPORT  Date of Study: 11/29/2015  Patient's Name: Sheila Costa MRN: [id number] Date of Birth: December 15, 1985  Referring Provider: Dr. Lorretta HarpXilin Niu  Clinical History: This is a 30 year old woman with spastic quadriplegia, severe mental retardation, with a probable unwitnessed seizure.  Medications: Valproate Sodium (DEPAKENE) solution 500 mg  carbamazepine (TEGRETOL XR) 12 hr tablet 600 mg  acetaminophen (TYLENOL) tablet 650 mg   albuterol (PROVENTIL) (2.5 MG/3ML) 0.083% nebulizer solution 2.5 mg  baclofen (LIORESAL) tablet 20 mg  calcium carbonate (TUMS - dosed in mg elemental calcium) chewable tablet 200 mg of elemental calcium  chlorhexidine (PERIDEX) 0.12 % solution 10 mL  cholecalciferol (VITAMIN D) tablet 2,000 Units  diphenhydrAMINE (BENADRYL) 12.5 MG/5ML elixir 12.5 mg  enoxaparin (LOVENOX) injection 30 mg  hydrocortisone cream 1 % 1 application  levOCARNitine (CARNITOR) 1 GM/10ML solution 330 mg  magnesium oxide (MAG-OX) tablet 400 mg  mupirocin ointment (BACTROBAN) 2 % 1 application  ondansetron (ZOFRAN) injection 4 mg  polyethylene glycol (MIRALAX / GLYCOLAX) packet 17 g  RESOURCE THICKENUP CLEAR  scopolamine (TRANSDERM-SCOP) 1 MG/3DAYS 1.5 mg  senna (SENOKOT) tablet 8.6 mg  sorbitol 70 % solution 10 mL   Technical Summary: A multichannel digital EEG recording measured by the international 10-20 system with electrodes applied with paste and impedances below 5000 ohms performed as portable with EKG monitoring in an awake and drowsy patient.  Hyperventilation and photic stimulation were not performed.  The digital EEG was referentially recorded, reformatted, and digitally filtered in a variety of bipolar and referential montages for optimal display.   Description: The patient is awake and drowsy during the recording. There is no clear posterior dominant rhythm. The background consists of a small amount of diffuse 4-7 Hz theta slowing.  During  drowsiness, there is an increase in theta slowing of the background. Deeper stages of sleep were not seen. Hyperventilation and photic stimulation were not performed.  There were no epileptiform discharges or electrographic seizures seen.    EKG lead showed sinus tachycardia.  Impression: This awake and drowsy EEG is abnormal due to mild diffuse slowing of the waking background.  Clinical Correlation of the above findings indicates diffuse cerebral dysfunction that is non-specific in etiology and can be seen with hypoxic/ischemic injury, toxic/metabolic encephalopathies, neurodegenerative disorders, or medication effect.  The absence of epileptiform discharges does not rule out a clinical diagnosis of epilepsy.  Clinical correlation is advised.   Patrcia DollyKaren Kanyah Matsushima, M.D.

## 2015-11-29 NOTE — Progress Notes (Addendum)
Pt arrived floor at around 2:00am. Pt was settled in the room with seizure precaution. Pt is NPO, NG tube was ordered. MD was called to clarify orders as pt is unable to follow commands. MD advised to attempt. NG tube was successfully placed, auscultated by 2 RNs, Toyin, RN and Clydie BraunKaren, Charity fundraiserN  As well as X-rayed. Meds were given. Pt is relaxed and will continue to monitor.

## 2015-11-29 NOTE — Progress Notes (Signed)
Subjective: Patient is back to baseline per social worker at bedside who knows her well. No further seizures.   Exam: Vitals:   11/29/15 0114 11/29/15 0558  BP: 116/77 116/73  Pulse: 94 86  Resp: 18 17  Temp: 97.6 F (36.4 C) (!) 96 F (35.6 C)        Gen: In bed, NAD MS: alert. Follows no commands. Able to eat apple sauce from Speech therapist.  CN: EOMI, TML, FACE symmetric blinks to threat bilaterally Motor: moving UE antigravity but randomly, bilateral LE flexion contractures Sensory:intact throughout   Pertinent Labs/Diagnostics: VPA--91 Tegretol--4.2 CT head - -IMPRESSION: Diffuse calvarial thickening which can be seen with anti seizure medication such as Dilantin.  Periventricular leukomalacia with mild ventriculomegaly. Findings may be related to old remote vascular insults. No acute intracranial abnormality identified.  EEG--PENDING    Impression: Probable unwitnessed seizure with postictal state. . Last seizure occurred approximately 5 years ago. DDx includes metabolic or other physiological stressor, baclofen withdrawal or breakthrough seizure without any precipitant.   currently back to baseline.    Recommendations: 1) Will obtain EEG.  Will stop Keppra at this point.   Continue current doses of VPA and tegretol.     11/29/2015, 10:19 AM

## 2015-11-30 DIAGNOSIS — R1310 Dysphagia, unspecified: Secondary | ICD-10-CM

## 2015-11-30 DIAGNOSIS — F79 Unspecified intellectual disabilities: Secondary | ICD-10-CM

## 2015-11-30 LAB — URINE CULTURE: Culture: NO GROWTH

## 2015-11-30 LAB — MRSA PCR SCREENING: MRSA by PCR: NEGATIVE

## 2015-11-30 MED ORDER — BACLOFEN 20 MG PO TABS: 20.0000 mg | ORAL_TABLET | Freq: Two times a day (BID) | ORAL | Status: DC

## 2015-11-30 NOTE — Progress Notes (Signed)
Sheila RossettiMichelle A Costa to be D/Costa'd Home per MD order.  Discussed with the patient's caregiver and all questions fully answered.   An After Visit Summary was printed and given to the patient. Patient received prescription.  D/Costa education completed with patient/family including follow up instructions, medication list, d/Costa activities limitations if indicated, with other d/Costa instructions as indicated by MD - patient able to verbalize understanding, all questions fully answered.   Patient instructed to return to ED, call 911, or call MD for any changes in condition.   Patient escorted via WC, and D/Costa home via private auto.  L'ESPERANCE, Sheila Costa 11/30/2015 11:19 AM

## 2015-11-30 NOTE — Discharge Summary (Addendum)
PATIENT DETAILS Name: Sheila Costa Age: 30 y.o. Sex: female Date of Birth: 02/26/1985 MRN: [id number]. Admitting Physician: Lorretta HarpXilin Niu, MD OZH:[id number]:Royals, Gretta BeganHoover M  Admit Date: 11/28/2015 Discharge date: 11/30/2015  Recommendations for Outpatient Follow-up:  1. Follow up with PCP in 1-2 weeks 2. Please obtain BMP/CBC in one week  Admitted From:  Group Home  Disposition: Group Home   Home Health: No  Equipment/Devices: None  Discharge Condition: Stable  CODE STATUS: FULL CODE  Diet recommendation:  Dysphagia 1 (Puree);Honey-thick liquid  Liquid Administration via: Spoon Medication Administration: Crushed with puree Supervision: Staff to assist with self feeding;Full supervision/cueing for compensatory strategies Compensations: Slow rate;Small sips/bites Postural Changes: Seated upright at 90 degrees   Brief Summary: See H&P, Labs, Consult and Test reports for all details in brief, Patient is a 30 y.o. female with past medical history of spastic quadriplegia, severe mental retardation, chronic constipation, prior history of seizure disorder who was transferred from her facility for possible breakthrough seizures and postictal state. Patient was subsequently admitted for further evaluation and treatment.  Brief Hospital Course: Probable breakthrough seizure with postictal state: Admitted for further eval and treatment-initially started on Keppra-however upon further Neurology follow up-this was stopped.Patient was then placed back on her usual anti-epileptics. CT head and EEG negative. Spoke with Felicie Mornavid Smith PA-C-Neurology-no further recommendations-ok to d/c back to group home on current/prior anti-seizure medications. Spoke with RN from Group home at bedside-patient is now back to her usual baseline.   Suspected chronic dysphagia: Seen by speech therapy, recommendations are for dysphagia 1 diet (per speech therapy note-likely baseline diet).   History of chronic  constipation: Continue current bowel regimen.  History of mental retardation/spastic quadriplegia: Lives in a group home, per group homes RN-patient is non verbal and bed bound. Her current mental state is back to her baseline.    Procedures/Studies: 11/15-eeg  Discharge Diagnoses:  Principal Problem:   Seizure [facility](HCC) Active Problems:   Spastic quadriplegia (HCC)   Mental retardation   Encounter for nasogastric (NG) tube placement   Discharge Instructions:  Activity:  As tolerated with Full fall precautions use walker/cane & assistance as needed   Discharge Instructions    Diet general    Complete by:  As directed    Dysphagia 1 (Puree);Honey-thick liquid  Liquid Administration via: Spoon Medication Administration: Crushed with puree Supervision: Staff to assist with self feeding;Full supervision/cueing for compensatory strategies Compensations: Slow rate;Small sips/bites Postural Changes: Seated upright at 90 degrees   Increase activity slowly    Complete by:  As directed        Medication List    STOP taking these medications   trimethoprim-polymyxin b ophthalmic solution Commonly known as:  POLYTRIM     TAKE these medications   baclofen 10 MG tablet Commonly known as:  LIORESAL Take 10 mg by mouth every morning.   baclofen 20 MG tablet Commonly known as:  LIORESAL Take 20 mg by mouth 2 (two) times daily. Pt takes this dose at 2pm and 8pm.   benzoyl peroxide 10 % gel Apply 1 application topically 2 (two) times daily.   calcium carbonate 500 MG chewable tablet Commonly known as:  TUMS - dosed in mg elemental calcium Chew 1 tablet by mouth daily.   chlorhexidine 0.12 % solution Commonly known as:  PERIDEX Use as directed 10 mLs in the mouth or throat at bedtime.   cholecalciferol 1000 units tablet Commonly known as:  VITAMIN D Take 2,000 Units by mouth daily.  diphenhydrAMINE 25 MG tablet Commonly known as:  BENADRYL Take 12.5 mg by mouth 2 (two)  times daily. What changed:  Another medication with the same name was removed. Continue taking this medication, and follow the directions you see here.   EQUETRO 300 MG Cp12 Generic drug:  Carbamazepine Take 600-900 mg by mouth 2 (two) times daily. Pt takes two capsules in the morning and three capsules in the evening.   hydrocortisone cream 1 % Apply 1 application topically as needed for itching.   levOCARNitine 330 MG tablet Commonly known as:  CARNITOR Take 330 mg by mouth 2 (two) times daily.   magnesium oxide 400 (241.3 Mg) MG tablet Commonly known as:  MAG-OX Take 400 mg by mouth 3 (three) times daily.   mupirocin ointment 2 % Commonly known as:  BACTROBAN Apply 1 application topically 2 (two) times daily as needed (for dryness). Pt applies to left cheek and periocular area.   PERIGUARD Oint Apply 1 application topically 4 (four) times daily as needed (for rash/irritation).   polyethylene glycol packet Commonly known as:  MIRALAX / GLYCOLAX Take 17 g by mouth 2 (two) times daily.   scopolamine 1 MG/3DAYS Commonly known as:  TRANSDERM-SCOP Place 1 patch onto the skin every 3 (three) days. Behind ear.   senna 8.6 MG Tabs tablet Commonly known as:  SENOKOT Take 1 tablet by mouth 2 (two) times daily.   sorbitol 70 % solution Take 10 mLs by mouth at bedtime.   Valproic Acid 250 MG/5ML Soln Take 10 mLs by mouth 3 (three) times daily.   vitamin A & D ointment Apply 1 application topically 3 (three) times daily as needed for dry skin.       No Known Allergies  Consultations:   neurology  Other Procedures/Studies: Ct Head Wo Contrast  Result Date: 11/29/2015 CLINICAL DATA:  Possible seizure. Unresponsive nodes. Patient's history is seizures, cerebral palsy, mental retardation cephalad of the. EXAM: CT HEAD WITHOUT CONTRAST TECHNIQUE: Contiguous axial images were obtained from the base of the skull through the vertex without intravenous contrast. COMPARISON:   None. FINDINGS: Brain: There is mild prominence of the lateral ventricles with contour undulations along the periphery of the lateral ventricles which may represent periventricular leukomalacia from chronic remote vascular insults possibly upper. No large vascular territory infarction is seen. No hemorrhage or midline shift. No edema. Fourth ventricle is midline. Vascular: No hyperdense vessel or unexpected calcification. Skull: There is diffuse calvarial thickening likely related to anti seizure medications such as Dilantin. Sinuses/Orbits: The frontal sinus is well-aerated. There is a concretion from inspissated mucus in the right frontal sinus. No acute sinusitis. The ethmoid, maxillary and sphenoid sinuses are clear. The mastoids are well-aerated. Other: None IMPRESSION: Diffuse calvarial thickening which can be seen with anti seizure medication such as Dilantin. Periventricular leukomalacia with mild ventriculomegaly. Findings may be related to old remote vascular insults. No acute intracranial abnormality identified. Electronically Signed   By: Tollie Eth M.D.   On: 11/29/2015 00:27   Dg Chest Portable 1 View  Result Date: 11/28/2015 CLINICAL DATA:  Cough, possible seizure.  History of cerebral palsy. EXAM: PORTABLE CHEST 1 VIEW COMPARISON:  Chest radiograph June 25, 2015 FINDINGS: Cardiomediastinal silhouette is normal. Low inspiratory examination. No pleural effusions or focal consolidations. Trachea projects midline and there is no pneumothorax. Soft tissue planes and included osseous structures are non-suspicious. Thoracolumbar Harrington rods. Scoliosis. IMPRESSION: No acute cardiopulmonary process. Electronically Signed   By: Michel Santee.D.  On: 11/28/2015 19:50   Dg Abd Portable 1v  Result Date: 11/29/2015 CLINICAL DATA:  Nasogastric tube placement EXAM: PORTABLE ABDOMEN - 1 VIEW COMPARISON:  None. FINDINGS: The nasogastric tube extends into the stomach. Abdominal gas pattern is  unremarkable. Extensive spinal fixation hardware noted. IMPRESSION: Nasogastric tube extends into the stomach. Electronically Signed   By: Ellery Plunkaniel R Mitchell M.D.   On: 11/29/2015 03:52      TODAY-DAY OF DISCHARGE:  Subjective:   Sheila Costa today has no headache,no chest abdominal pain,no new weakness tingling or numbness   Objective:   Blood pressure 117/79, pulse 95, temperature 97.8 F (36.6 C), temperature source Axillary, resp. rate 20, weight 38.6 kg (85 lb), last menstrual period 07/26/2015, SpO2 98 %. No intake or output data in the 24 hours ending 11/30/15 0923 Filed Weights   11/28/15 1653  Weight: 38.6 kg (85 lb)    Exam: General appearance :Awake-does not follow commands. Chronically ill appearing. Eyes:, pupils equally reactive to light and accomodation,no scleral icterus.Pink conjunctiva HEENT: Atraumatic and Normocephalic Neck: supple, no JVD.  Resp:Good air entry bilaterally, no added sounds  CVS: S1 S2 regular, GI: Bowel sounds present, Non tender and not distended Extremities: B/L Lower Ext shows no edema, both legs are warm to touch Neurology:  Non focal-seems to be moving all 4 ext Musculoskeletal:No digital cyanosis Skin:No Rash, warm and dry Wounds:N/A   PERTINENT RADIOLOGIC STUDIES: Ct Head Wo Contrast  Result Date: 11/29/2015 CLINICAL DATA:  Possible seizure. Unresponsive nodes. Patient's history is seizures, cerebral palsy, mental retardation cephalad of the. EXAM: CT HEAD WITHOUT CONTRAST TECHNIQUE: Contiguous axial images were obtained from the base of the skull through the vertex without intravenous contrast. COMPARISON:  None. FINDINGS: Brain: There is mild prominence of the lateral ventricles with contour undulations along the periphery of the lateral ventricles which may represent periventricular leukomalacia from chronic remote vascular insults possibly upper. No large vascular territory infarction is seen. No hemorrhage or midline shift.  No edema. Fourth ventricle is midline. Vascular: No hyperdense vessel or unexpected calcification. Skull: There is diffuse calvarial thickening likely related to anti seizure medications such as Dilantin. Sinuses/Orbits: The frontal sinus is well-aerated. There is a concretion from inspissated mucus in the right frontal sinus. No acute sinusitis. The ethmoid, maxillary and sphenoid sinuses are clear. The mastoids are well-aerated. Other: None IMPRESSION: Diffuse calvarial thickening which can be seen with anti seizure medication such as Dilantin. Periventricular leukomalacia with mild ventriculomegaly. Findings may be related to old remote vascular insults. No acute intracranial abnormality identified. Electronically Signed   By: Tollie Ethavid  Kwon M.D.   On: 11/29/2015 00:27   Dg Chest Portable 1 View  Result Date: 11/28/2015 CLINICAL DATA:  Cough, possible seizure.  History of cerebral palsy. EXAM: PORTABLE CHEST 1 VIEW COMPARISON:  Chest radiograph June 25, 2015 FINDINGS: Cardiomediastinal silhouette is normal. Low inspiratory examination. No pleural effusions or focal consolidations. Trachea projects midline and there is no pneumothorax. Soft tissue planes and included osseous structures are non-suspicious. Thoracolumbar Harrington rods. Scoliosis. IMPRESSION: No acute cardiopulmonary process. Electronically Signed   By: Awilda Metroourtnay  Bloomer M.D.   On: 11/28/2015 19:50   Dg Abd Portable 1v  Result Date: 11/29/2015 CLINICAL DATA:  Nasogastric tube placement EXAM: PORTABLE ABDOMEN - 1 VIEW COMPARISON:  None. FINDINGS: The nasogastric tube extends into the stomach. Abdominal gas pattern is unremarkable. Extensive spinal fixation hardware noted. IMPRESSION: Nasogastric tube extends into the stomach. Electronically Signed   By: Ellery Plunkaniel R Mitchell M.D.   On:  11/29/2015 03:52     PERTINENT LAB RESULTS: CBC:  Recent Labs  11/28/15 1748  WBC 4.7  HGB 13.5  HCT 41.1  PLT 202   CMET CMP     Component Value  Date/Time   NA 142 11/28/2015 1748   K 4.1 11/28/2015 1748   CL 110 11/28/2015 1748   CO2 21 (L) 11/28/2015 1748   GLUCOSE 70 11/28/2015 1748   BUN 11 11/28/2015 1748   CREATININE 0.53 11/28/2015 1748   CALCIUM 9.0 11/28/2015 1748   PROT 7.3 08/25/2015 1225   ALBUMIN 3.7 08/25/2015 1225   AST 33 08/25/2015 1225   ALT 16 08/25/2015 1225   ALKPHOS 46 08/25/2015 1225   BILITOT 0.3 08/25/2015 1225   GFRNONAA >60 11/28/2015 1748   GFRAA >60 11/28/2015 1748    GFR CrCl cannot be calculated (Unknown ideal weight.). No results for input(s): LIPASE, AMYLASE in the last 72 hours. No results for input(s): CKTOTAL, CKMB, CKMBINDEX, TROPONINI in the last 72 hours. Invalid input(s): POCBNP No results for input(s): DDIMER in the last 72 hours. No results for input(s): HGBA1C in the last 72 hours. No results for input(s): CHOL, HDL, LDLCALC, TRIG, CHOLHDL, LDLDIRECT in the last 72 hours. No results for input(s): TSH, T4TOTAL, T3FREE, THYROIDAB in the last 72 hours.  Invalid input(s): FREET3 No results for input(s): VITAMINB12, FOLATE, FERRITIN, TIBC, IRON, RETICCTPCT in the last 72 hours. Coags: No results for input(s): INR in the last 72 hours.  Invalid input(s): PT Microbiology: Recent Results (from the past 240 hour(s))  Urine culture     Status: None   Collection Time: 11/28/15 10:00 PM  Result Value Ref Range Status   Specimen Description URINE, RANDOM  Final   Special Requests NONE  Final   Culture NO GROWTH  Final   Report Status 11/30/2015 FINAL  Final    FURTHER DISCHARGE INSTRUCTIONS:  Get Medicines reviewed and adjusted: Please take all your medications with you for your next visit with your Primary MD  Laboratory/radiological data: Please request your Primary MD to go over all hospital tests and procedure/radiological results at the follow up, please ask your Primary MD to get all Hospital records sent to his/her office.  In some cases, they will be blood work,  cultures and biopsy results pending at the time of your discharge. Please request that your primary care M.D. goes through all the records of your hospital data and follows up on these results.  Also Note the following: If you experience worsening of your admission symptoms, develop shortness of breath, life threatening emergency, suicidal or homicidal thoughts you must seek medical attention immediately by calling 911 or calling your MD immediately  if symptoms less severe.  You must read complete instructions/literature along with all the possible adverse reactions/side effects for all the Medicines you take and that have been prescribed to you. Take any new Medicines after you have completely understood and accpet all the possible adverse reactions/side effects.   Do not drive when taking Pain medications or sleeping medications (Benzodaizepines)  Do not take more than prescribed Pain, Sleep and Anxiety Medications. It is not advisable to combine anxiety,sleep and pain medications without talking with your primary care practitioner  Special Instructions: If you have smoked or chewed Tobacco  in the last 2 yrs please stop smoking, stop any regular Alcohol  and or any Recreational drug use.  Wear Seat belts while driving.  Please note: You were cared for by a hospitalist during your hospital  stay. Once you are discharged, your primary care physician will handle any further medical issues. Please note that NO REFILLS for any discharge medications will be authorized once you are discharged, as it is imperative that you return to your primary care physician (or establish a relationship with a primary care physician if you do not have one) for your post hospital discharge needs so that they can reassess your need for medications and monitor your lab values.  Total Time spent coordinating discharge including counseling, education and face to face time equals  45  minutes.  SignedJeoffrey Massed 11/30/2015 9:23 AM

## 2015-12-01 IMAGING — DX DG ABD PORTABLE 1V
2 series · 2 of 2 positions shown · non-contrast
Comparison: 01/17/2014

CLINICAL DATA: Constipation

EXAM:
PORTABLE ABDOMEN - 1 VIEW

[abdomen kub (1 of 2)]
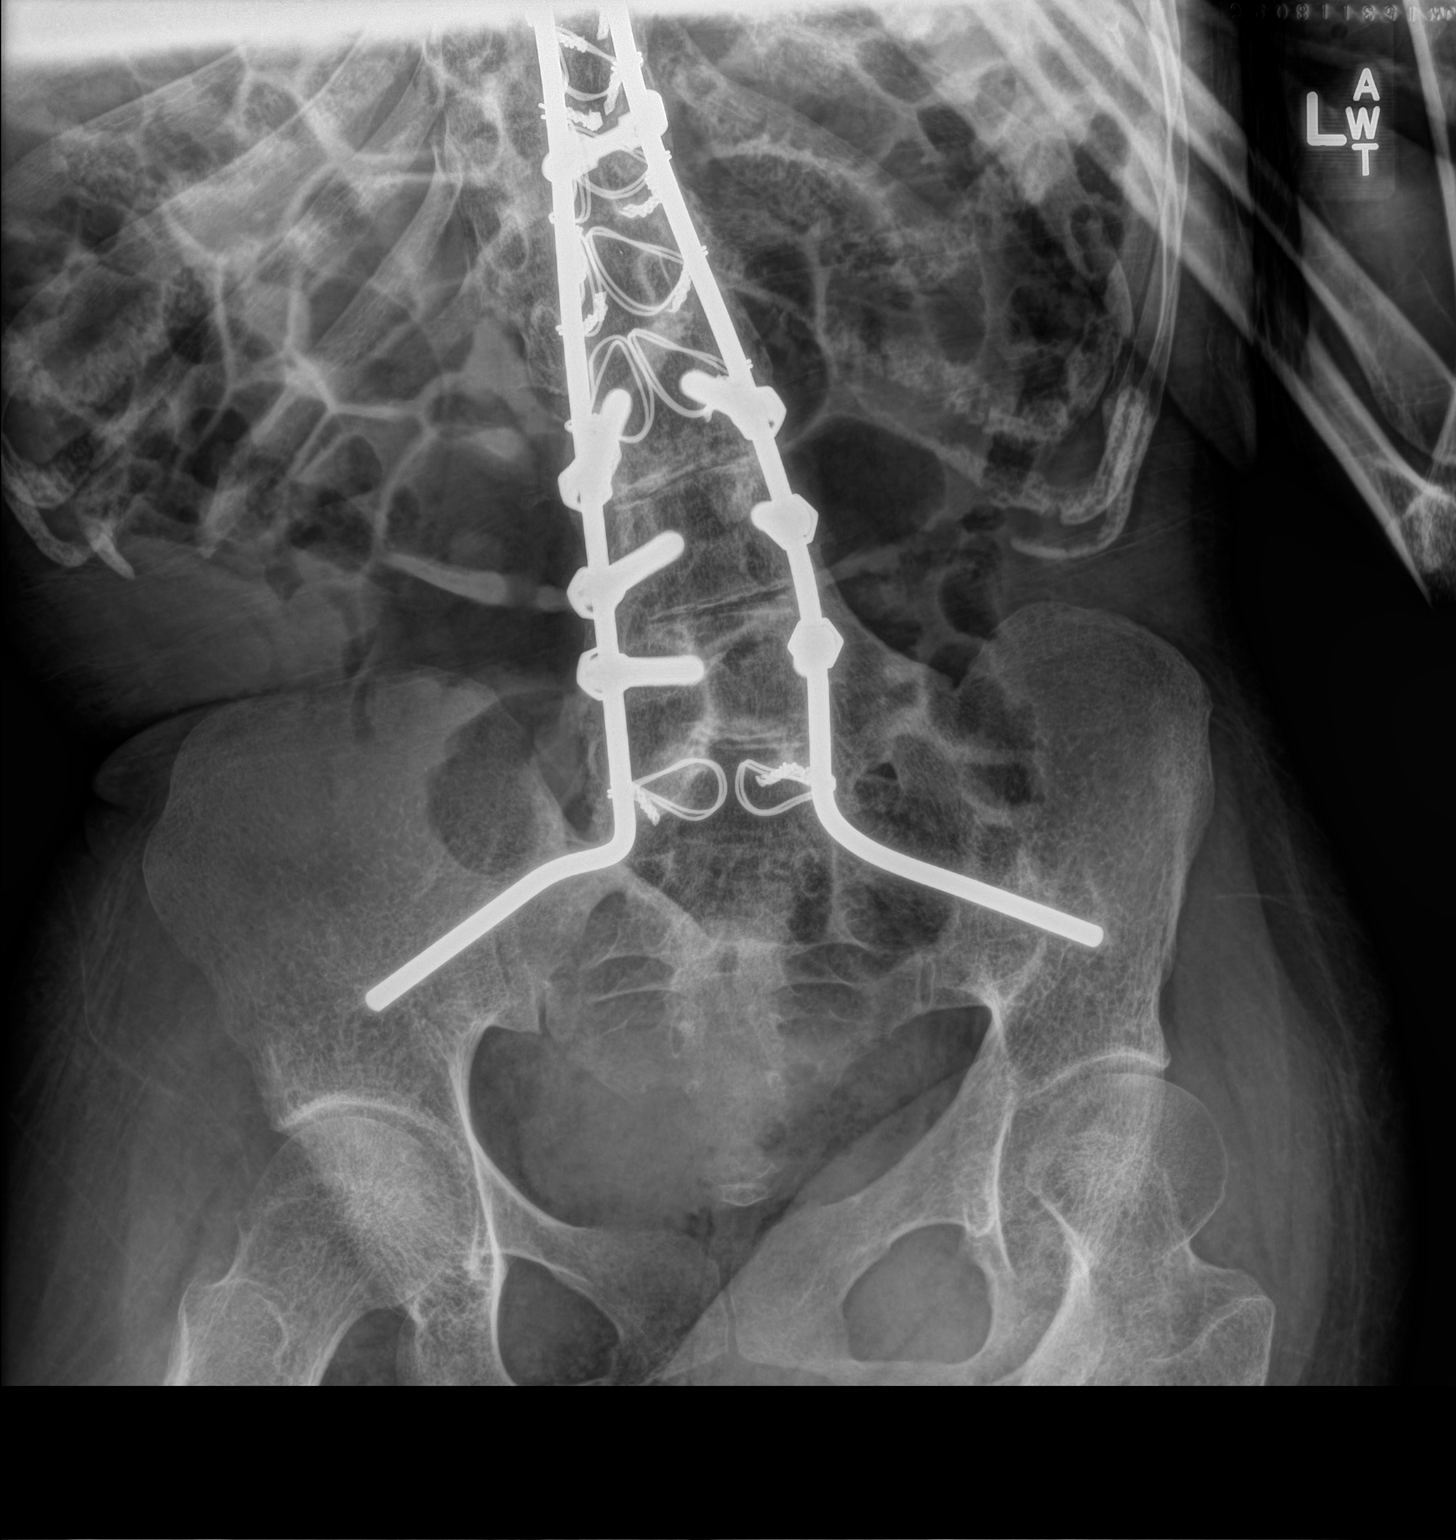

[abdomen kub (2 of 2)]
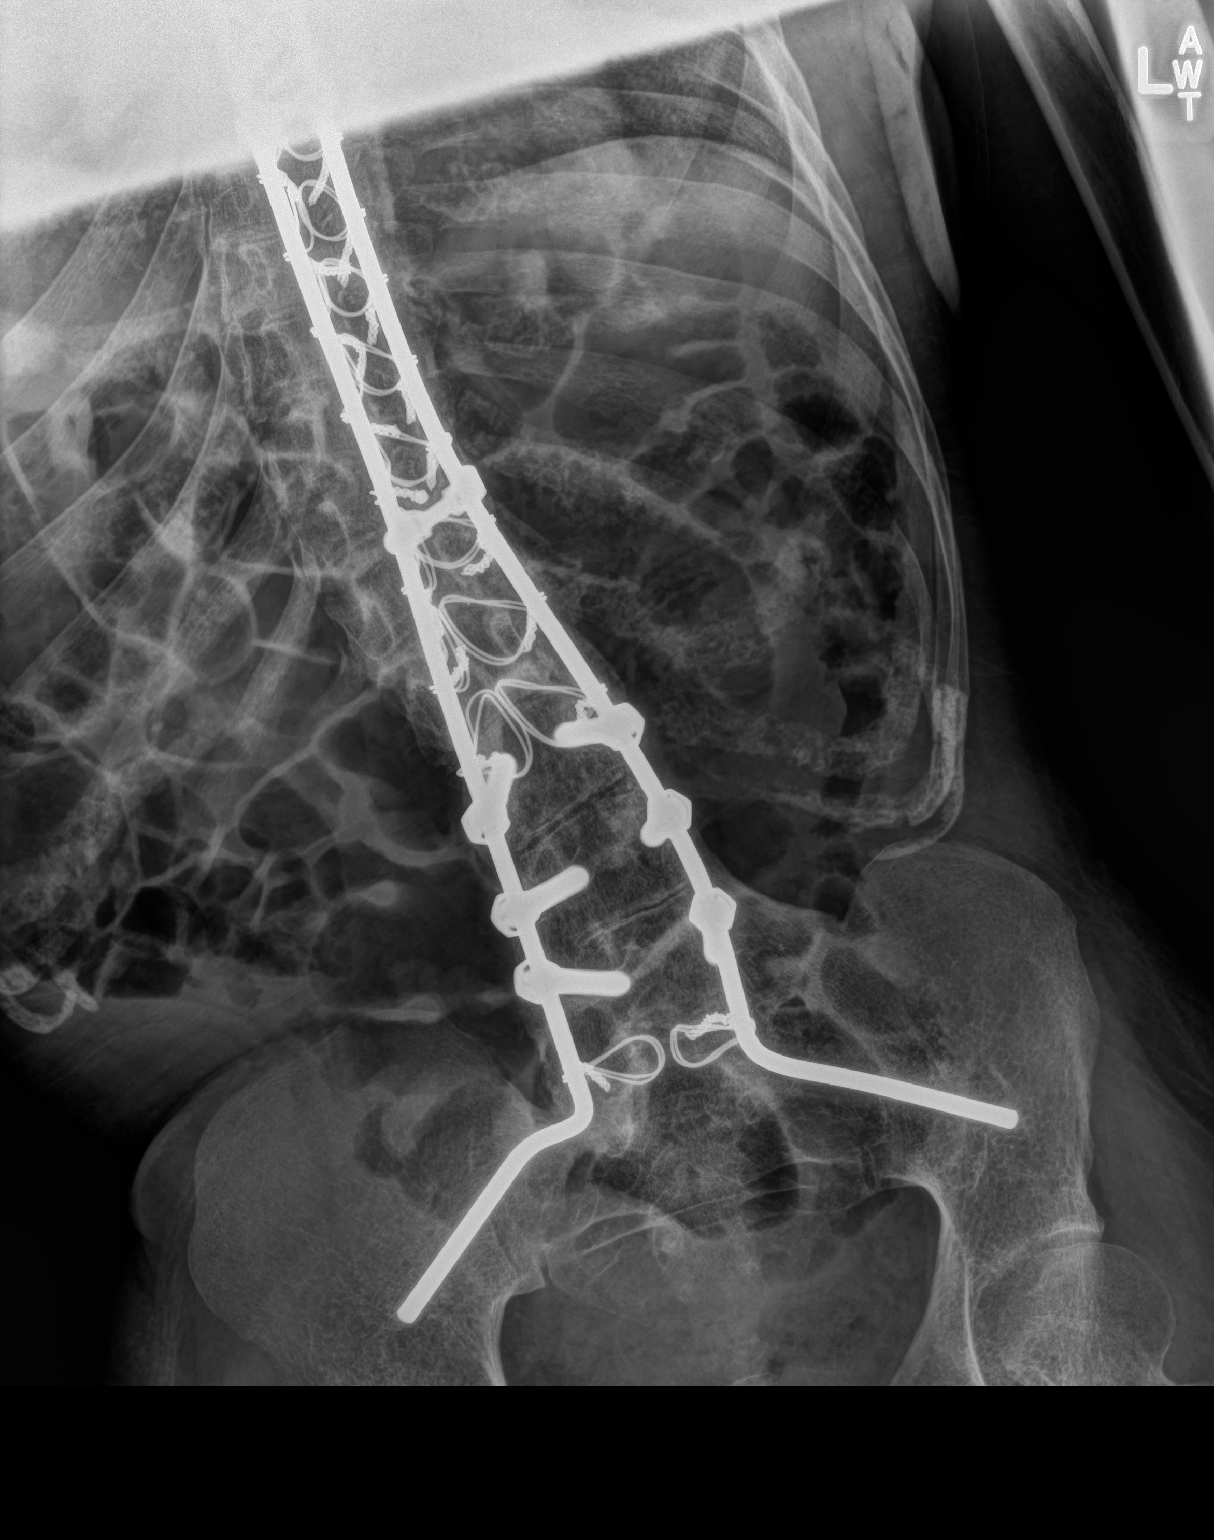

[2 of 2 positions shown; findings below may reference images not displayed]

FINDINGS: Scattered large and small bowel gas is noted. No obstructive changes
are seen. Mild fecal material is noted within the colon.
Postsurgical changes are noted extending from the thoracic spine
into the pelvis. No free air is seen.
IMPRESSION: No acute abnormality noted.

## 2016-02-22 ENCOUNTER — Emergency Department (HOSPITAL_COMMUNITY)
Admission: EM | Admit: 2016-02-22 | Discharge: 2016-02-22 | Disposition: A | Discharge status: Home / Self Care | Payer: Medicare Other | Attending: Emergency Medicine | Admitting: Emergency Medicine

## 2016-02-22 ENCOUNTER — Emergency Department (HOSPITAL_COMMUNITY): Payer: Medicare Other

## 2016-02-22 ENCOUNTER — Encounter (HOSPITAL_COMMUNITY): Payer: Self-pay | Admitting: Emergency Medicine

## 2016-02-22 DIAGNOSIS — R109 Unspecified abdominal pain: Secondary | ICD-10-CM | POA: Diagnosis present

## 2016-02-22 DIAGNOSIS — K529 Noninfective gastroenteritis and colitis, unspecified: Secondary | ICD-10-CM | POA: Diagnosis not present

## 2016-02-22 LAB — COMPREHENSIVE METABOLIC PANEL
ALT: 10 U/L — ABNORMAL LOW (ref 14–54)
AST: 24 U/L (ref 15–41)
Albumin: 3.9 g/dL (ref 3.5–5.0)
Alkaline Phosphatase: 45 U/L (ref 38–126)
Anion gap: 13 (ref 5–15)
BUN: 9 mg/dL (ref 6–20)
CHLORIDE: 104 mmol/L (ref 101–111)
CO2: 23 mmol/L (ref 22–32)
Calcium: 8.9 mg/dL (ref 8.9–10.3)
Creatinine, Ser: 0.42 mg/dL — ABNORMAL LOW (ref 0.44–1.00)
GFR calc Af Amer: >60 mL/min (ref >60)
Glucose, Bld: 129 mg/dL — ABNORMAL HIGH (ref 65–99)
POTASSIUM: 3.6 mmol/L (ref 3.5–5.1)
Sodium: 140 mmol/L (ref 135–145)
Total Bilirubin: 0.5 mg/dL (ref 0.3–1.2)
Total Protein: 7.3 g/dL (ref 6.5–8.1)

## 2016-02-22 LAB — CBC WITH DIFFERENTIAL/PLATELET
BASOS ABS: 0 10*3/uL (ref 0.0–0.1)
BASOS PCT: 0 %
EOS ABS: 0 10*3/uL (ref 0.0–0.7)
EOS PCT: 0 %
HCT: 35.8 % — ABNORMAL LOW (ref 36.0–46.0)
Hemoglobin: 11.8 g/dL — ABNORMAL LOW (ref 12.0–15.0)
LYMPHS PCT: 24 %
Lymphs Abs: 1.6 10*3/uL (ref 0.7–4.0)
MCH: 25.7 pg — ABNORMAL LOW (ref 26.0–34.0)
MCHC: 33 g/dL (ref 30.0–36.0)
MCV: 78 fL (ref 78.0–100.0)
MONO ABS: 0.9 10*3/uL (ref 0.1–1.0)
Monocytes Relative: 14 %
Neutro Abs: 4.3 10*3/uL (ref 1.7–7.7)
Neutrophils Relative %: 62 %
PLATELETS: 237 10*3/uL (ref 150–400)
RBC: 4.59 MIL/uL (ref 3.87–5.11)
RDW: 14.1 % (ref 11.5–15.5)
WBC: 6.8 10*3/uL (ref 4.0–10.5)

## 2016-02-22 LAB — URINALYSIS, ROUTINE W REFLEX MICROSCOPIC
BILIRUBIN URINE: NEGATIVE
Glucose, UA: NEGATIVE mg/dL
Hgb urine dipstick: NEGATIVE
KETONES UR: NEGATIVE mg/dL
Leukocytes, UA: NEGATIVE
NITRITE: NEGATIVE
Protein, ur: NEGATIVE mg/dL
SPECIFIC GRAVITY, URINE: 1.018 (ref 1.005–1.030)
pH: 8 (ref 5.0–8.0)

## 2016-02-22 LAB — LIPASE, BLOOD: LIPASE: 39 U/L (ref 11–51)

## 2016-02-22 LAB — I-STAT BETA HCG BLOOD, ED (MC, WL, AP ONLY)

## 2016-02-22 LAB — POC URINE PREG, ED: PREG TEST UR: NEGATIVE

## 2016-02-22 MED ORDER — ONDANSETRON HCL 4 MG PO TABS: 4.0000 mg | ORAL_TABLET | Freq: Three times a day (TID) | ORAL | 0 refills | Status: DC | PRN

## 2016-02-22 MED ORDER — DICYCLOMINE HCL 10 MG PO CAPS
10.0000 mg | ORAL_CAPSULE | Freq: Once | ORAL | Status: AC
  Administered 2016-02-22: 10 mg via ORAL
  Filled 2016-02-22: qty 1

## 2016-02-22 MED ORDER — DICYCLOMINE HCL 10 MG/ML IM SOLN: 20.0000 mg | Freq: Once | INTRAMUSCULAR | Status: DC

## 2016-02-22 MED ORDER — DICYCLOMINE HCL 20 MG PO TABS: 20.0000 mg | ORAL_TABLET | Freq: Two times a day (BID) | ORAL | 0 refills | Status: AC | PRN

## 2016-02-22 MED ORDER — SODIUM CHLORIDE 0.9 % IV BOLUS (SEPSIS)
1000.0000 mL | Freq: Once | INTRAVENOUS | Status: AC
  Administered 2016-02-22: 1000 mL via INTRAVENOUS

## 2016-02-22 NOTE — ED Provider Notes (Signed)
[facility]-EMERGENCY DEPT Provider Note   CSN: [id number] Arrival date & time: 02/22/16  1016     History   Chief Complaint Chief Complaint  Patient presents with  . Abdominal Pain    HPI Sheila Costa is a 31 y.o. female.  HPI Level V caveat due to severe mental retardation. History given by caretaker. Patient is resident of group home and was found on the floor this morning. Had had one episode of vomiting and loose stool. He not had a bowel movement for 3-4 days prior. Patient then had another episode of vomiting this morning. Described vomitus as green in color. Caretaker admits to chronic constipation and having been evaluated by multiple gastroenterologist. States she is only able to have a bowel movement after enema. Believes that she is having abdominal pain because she is intermittently agitated and crying out. Past Medical History:  Diagnosis Date  . Cerebral palsy (HCC)   . Constipation   . Dysphagia   . Dysphagia   . Encephalopathy   . Failure to thrive (0-17)   . Mental retardation   . Osteopenia   . Osteoporosis   . Pressure ulcer 2008   right lower extremity  . Scoliosis   . Seizure disorder (HCC)   . Seizures (HCC)   . Spastic quadriparesis (HCC)   . Static encephalopathy     Patient Active Problem List   Diagnosis Date Noted  . Encounter for nasogastric (NG) tube placement   . Mental retardation 01/20/2014  . Constipated 01/20/2014  . Enteritis 01/17/2014  . Possible UTI (lower urinary tract infection) 01/17/2014  . UTI (lower urinary tract infection) 03/18/2011  . Hypernatremia 03/18/2011  . 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 Seizure (HCC) 03/14/2011  . Other dysphagia 03/14/2011    Past Surgical History:  Procedure Laterality Date  . BACK SURGERY    . ESOPHAGOGASTRODUODENOSCOPY (EGD) WITH PROPOFOL N/A 02/24/2015   Procedure: ESOPHAGOGASTRODUODENOSCOPY (EGD) WITH PROPOFOL;  Surgeon: Graylin Shiver, MD;  Location: [facility] ENDOSCOPY;  Service:  Endoscopy;  Laterality: N/A;  . hardware in back      OB History    Gravida Para Term Preterm AB Living   0 0 0 0 0     SAB TAB Ectopic Multiple Live Births   0 0 0           Home Medications    Prior to Admission medications   Medication Sig Start Date End Date Taking? Authorizing Provider  baclofen (LIORESAL) 10 MG tablet Take 10 mg by mouth every morning.   Yes Historical Provider, MD  baclofen (LIORESAL) 20 MG tablet Take 20 mg by mouth 2 (two) times daily. Pt takes this dose at 2pm and 8pm.   Yes Historical Provider, MD  benzoyl peroxide 10 % gel Apply 1 application topically 2 (two) times daily.    Yes Historical Provider, MD  calcium carbonate (TUMS - DOSED IN MG ELEMENTAL CALCIUM) 500 MG chewable tablet Chew 1 tablet by mouth daily.    Yes Historical Provider, MD  Carbamazepine (EQUETRO) 300 MG CP12 Take 600-900 mg by mouth See admin instructions. Take 600 mg every morning and 900 mg every evening.   Yes Historical Provider, MD  chlorhexidine (PERIDEX) 0.12 % solution Use as directed 10 mLs in the mouth or throat at bedtime.    Yes Historical Provider, MD  cholecalciferol (VITAMIN D) 1000 UNITS tablet Take 2,000 Units by mouth daily.   Yes Historical Provider, MD  diphenhydrAMINE (BENADRYL) 25 MG tablet Take  12.5 mg by mouth 2 (two) times daily.   Yes Historical Provider, MD  hydrocortisone cream 1 % Apply 1 application topically as needed for itching.    Yes Historical Provider, MD  levOCARNitine (CARNITOR) 330 MG tablet Take 330 mg by mouth 2 (two) times daily.   Yes Historical Provider, MD  lubiprostone (AMITIZA) 24 MCG capsule Take 24 mcg by mouth 2 (two) times daily with a meal.   Yes Historical Provider, MD  magnesium oxide (MAG-OX) 400 (241.3 Mg) MG tablet Take 400 mg by mouth 3 (three) times daily.   Yes Historical Provider, MD  mupirocin ointment (BACTROBAN) 2 % Apply 1 application topically See admin instructions. Apply topically to the affected area(s) of skin on L  cheek and periocular area twice daily AND as needed for dryness.   Yes Historical Provider, MD  Nutritional Supplements (ENSURE ENLIVE PO) Take 237 mLs by mouth 3 (three) times a week. Monday, Wednesday, and Friday with lunch.   Yes Historical Provider, MD  polyethylene glycol (MIRALAX / GLYCOLAX) packet Take 17 g by mouth 2 (two) times daily. Mix with 4 ounces of prune juice.   Yes Historical Provider, MD  promethazine (PHENERGAN) 25 MG suppository INSERT 1 SUPPOSITORY RECTALLY EVERY 6 HOURS AS NEEDED 02/16/16  Yes Historical Provider, MD  scopolamine (TRANSDERM-SCOP) 1.5 MG Place 1 patch onto the skin every 3 (three) days. Behind ear.   Yes Historical Provider, MD  senna (SENOKOT) 8.6 MG TABS Take 1 tablet by mouth 2 (two) times daily.    Yes Historical Provider, MD  Skin Protectants, Misc. (PERIGUARD) OINT Apply 1 application topically 4 (four) times daily as needed (for rash/irritation).    Yes Historical Provider, MD  Valproate Sodium (VALPROIC ACID) 250 MG/5ML SOLN Take 10 mLs by mouth 3 (three) times daily.   Yes Historical Provider, MD  Vitamins A & D (VITAMIN A & D) ointment Apply 1 application topically 3 (three) times daily as needed for dry skin.   Yes Historical Provider, MD  dicyclomine (BENTYL) 20 MG tablet Take 1 tablet (20 mg total) by mouth 2 (two) times daily as needed for spasms. 02/22/16   Loren Racer, MD  ondansetron (ZOFRAN) 4 MG tablet Take 1 tablet (4 mg total) by mouth every 8 (eight) hours as needed for nausea or vomiting. 02/22/16   Loren Racer, MD    Family History No family history on file.  Social History Social History  Substance Use Topics  . Smoking status: Never Smoker  . Smokeless tobacco: Never Used  . Alcohol use No     Allergies   Patient has no known allergies.   Review of Systems Review of Systems  Unable to perform ROS: Patient nonverbal     Physical Exam Updated Vital Signs BP 110/69   Pulse 98   Temp 97.9 F (36.6 C)   Resp 18    SpO2 100%   Physical Exam  Constitutional: She appears well-developed and well-nourished.  HENT:  Head: Normocephalic and atraumatic.  Mouth/Throat: Oropharynx is clear and moist.  Moist mucous membranes  Eyes: EOM are normal. Pupils are equal, round, and reactive to light.  Neck: Normal range of motion. Neck supple.  Cardiovascular: Normal rate and regular rhythm.  Exam reveals no gallop and no friction rub.   No murmur heard. Pulmonary/Chest: Effort normal and breath sounds normal.  Abdominal: Soft. Bowel sounds are normal. There is no tenderness. There is no rebound and no guarding.  Mildly firm distended abdomen with no focal  tenderness.  Musculoskeletal: Normal range of motion. She exhibits no edema or tenderness.  Atrophic contracted upper and lower extremities. Distal pulses intact  Neurological:  Nonverbal with spastic quadriplegia. Patients at her baseline mental status per caretaker.  Skin: Skin is warm and dry. Capillary refill takes less than 2 seconds. No rash noted. No erythema.  Psychiatric: She has a normal mood and affect. Her behavior is normal.  Nursing note and vitals reviewed.    ED Treatments / Results  Labs (all labs ordered are listed, but only abnormal results are displayed) Labs Reviewed  CBC WITH DIFFERENTIAL/PLATELET - Abnormal; Notable for the following:       Result Value   Hemoglobin 11.8 (*)    HCT 35.8 (*)    MCH 25.7 (*)    All other components within normal limits  COMPREHENSIVE METABOLIC PANEL - Abnormal; Notable for the following:    Glucose, Bld 129 (*)    Creatinine, Ser 0.42 (*)    ALT 10 (*)    All other components within normal limits  URINALYSIS, ROUTINE W REFLEX MICROSCOPIC - Abnormal; Notable for the following:    APPearance CLOUDY (*)    All other components within normal limits  LIPASE, BLOOD  I-STAT BETA HCG BLOOD, ED ([facility], [facility], [facility] ONLY)  I-STAT BETA HCG BLOOD, ED ([facility], [facility], [facility] ONLY)  POC URINE PREG, ED    EKG  EKG  Interpretation None       Radiology Dg Abd Acute W/chest  Result Date: 02/22/2016 CLINICAL DATA:  Chronic constipation, nausea and vomiting. History of cerebral palsy. EXAM: DG ABDOMEN ACUTE W/ 1V CHEST COMPARISON:  None. FINDINGS: No evidence of bowel obstruction or intraperitoneal free air. Scattered air containing small bowel loops are noted potentially representing a mild enteritis. Low lung volumes bilaterally without overt pulmonary edema, effusion or pulmonary consolidation. Spinal fixation hardware is again seen along the thoracolumbar spine. There is mild dextroconvex scoliosis with apex at the thoracolumbar junction. No radiopaque calculi or other significant radiographic abnormality is seen. IMPRESSION: Scattered air containing small bowel loops without obstruction. Findings could potentially represent a mild enteritis. No acute cardiopulmonary disease. Electronically Signed   By: Tollie Eth M.D.   On: 02/22/2016 14:48    Procedures Procedures (including critical care time)  Medications Ordered in ED Medications  dicyclomine (BENTYL) capsule 10 mg (10 mg Oral Given 02/22/16 1204)  sodium chloride 0.9 % bolus 1,000 mL (0 mLs Intravenous Stopped 02/22/16 1843)     Initial Impression / Assessment and Plan / ED Course  I have reviewed the triage vital signs and the nursing notes.  Pertinent labs & imaging results that were available during my care of the patient were reviewed by me and considered in my medical decision making (see chart for details).     Abdominal exam is benign. Normal white blood cell count. X-rays with findings consistent with enteritis. Given vomiting and diarrhea will treat as such. Advised to follow-up with her primary physician. Return precautions given.  Final Clinical Impressions(s) / ED Diagnoses   Final diagnoses:  Gastroenteritis    New Prescriptions Discharge Medication List as of 02/22/2016  5:02 PM    START taking these medications   Details    dicyclomine (BENTYL) 20 MG tablet Take 1 tablet (20 mg total) by mouth 2 (two) times daily as needed for spasms., Starting Thu 02/22/2016, Print    ondansetron (ZOFRAN) 4 MG tablet Take 1 tablet (4 mg total) by mouth every 8 (eight) hours  as needed for nausea or vomiting., Starting Thu 02/22/2016, Print         Loren Raceravid Nina Hoar, MD 02/23/16 1616

## 2016-02-22 NOTE — ED Notes (Signed)
Pt not retaining urine. No output when placing In & out.

## 2016-02-22 NOTE — ED Triage Notes (Signed)
Per [facility] states chronic constipation, N/V on and off with chronic issue-vomited green liquid this am-sent here for eval

## 2016-02-22 NOTE — ED Notes (Signed)
Bed: WA21 Expected date:  Expected time:  Means of arrival:  Comments: 

## 2016-04-12 ENCOUNTER — Encounter (HOSPITAL_COMMUNITY): Payer: Self-pay | Admitting: *Deleted

## 2016-04-12 ENCOUNTER — Emergency Department (HOSPITAL_COMMUNITY)
Admission: EM | Admit: 2016-04-12 | Discharge: 2016-04-12 | Disposition: A | Discharge status: Home / Self Care | Payer: Medicare Other | Attending: Emergency Medicine | Admitting: Emergency Medicine

## 2016-04-12 ENCOUNTER — Emergency Department (HOSPITAL_COMMUNITY): Payer: Medicare Other

## 2016-04-12 DIAGNOSIS — R1114 Bilious vomiting: Secondary | ICD-10-CM | POA: Insufficient documentation

## 2016-04-12 DIAGNOSIS — Z79899 Other long term (current) drug therapy: Secondary | ICD-10-CM | POA: Insufficient documentation

## 2016-04-12 DIAGNOSIS — R1110 Vomiting, unspecified: Secondary | ICD-10-CM | POA: Diagnosis present

## 2016-04-12 DIAGNOSIS — G809 Cerebral palsy, unspecified: Secondary | ICD-10-CM | POA: Diagnosis not present

## 2016-04-12 LAB — URINALYSIS, ROUTINE W REFLEX MICROSCOPIC
Bilirubin Urine: NEGATIVE
Glucose, UA: NEGATIVE mg/dL
Hgb urine dipstick: NEGATIVE
Ketones, ur: NEGATIVE mg/dL
Nitrite: POSITIVE — AB
Protein, ur: 100 mg/dL — AB
RBC / HPF: NONE SEEN RBC/hpf (ref 0–5)
Specific Gravity, Urine: 1.01 (ref 1.005–1.030)
pH: 7 (ref 5.0–8.0)

## 2016-04-12 LAB — COMPREHENSIVE METABOLIC PANEL
ALT: 11 U/L — ABNORMAL LOW (ref 14–54)
AST: 28 U/L (ref 15–41)
Albumin: 4.2 g/dL (ref 3.5–5.0)
Alkaline Phosphatase: 55 U/L (ref 38–126)
Anion gap: 7 (ref 5–15)
BUN: 10 mg/dL (ref 6–20)
CO2: 24 mmol/L (ref 22–32)
Calcium: 9.2 mg/dL (ref 8.9–10.3)
Chloride: 107 mmol/L (ref 101–111)
Creatinine, Ser: 0.47 mg/dL (ref 0.44–1.00)
GFR calc Af Amer: >60 mL/min (ref >60)
GFR calc non Af Amer: >60 mL/min (ref >60)
Glucose, Bld: 114 mg/dL — ABNORMAL HIGH (ref 65–99)
Potassium: 4.3 mmol/L (ref 3.5–5.1)
Sodium: 138 mmol/L (ref 135–145)
Total Bilirubin: 0.4 mg/dL (ref 0.3–1.2)
Total Protein: 8.1 g/dL (ref 6.5–8.1)

## 2016-04-12 LAB — CBC WITH DIFFERENTIAL/PLATELET
Basophils Absolute: 0 10*3/uL (ref 0.0–0.1)
Basophils Relative: 0 %
Eosinophils Absolute: 0 10*3/uL (ref 0.0–0.7)
Eosinophils Relative: 0 %
HCT: 35.7 % — ABNORMAL LOW (ref 36.0–46.0)
Hemoglobin: 11.6 g/dL — ABNORMAL LOW (ref 12.0–15.0)
Lymphocytes Relative: 20 %
Lymphs Abs: 1 10*3/uL (ref 0.7–4.0)
MCH: 25.3 pg — ABNORMAL LOW (ref 26.0–34.0)
MCHC: 32.5 g/dL (ref 30.0–36.0)
MCV: 77.8 fL — ABNORMAL LOW (ref 78.0–100.0)
Monocytes Absolute: 0.5 10*3/uL (ref 0.1–1.0)
Monocytes Relative: 10 %
Neutro Abs: 3.5 10*3/uL (ref 1.7–7.7)
Neutrophils Relative %: 70 %
Platelets: 174 10*3/uL (ref 150–400)
RBC: 4.59 MIL/uL (ref 3.87–5.11)
RDW: 13.8 % (ref 11.5–15.5)
WBC: 5 10*3/uL (ref 4.0–10.5)

## 2016-04-12 LAB — POC URINE PREG, ED: Preg Test, Ur: NEGATIVE

## 2016-04-12 MED ORDER — ONDANSETRON 4 MG PO TBDP
4.0000 mg | ORAL_TABLET | Freq: Once | ORAL | Status: AC
  Administered 2016-04-12: 4 mg via ORAL
  Filled 2016-04-12: qty 1

## 2016-04-12 MED ORDER — ONDANSETRON 4 MG PO TBDP: 4.0000 mg | ORAL_TABLET | Freq: Three times a day (TID) | ORAL | 0 refills | Status: DC | PRN

## 2016-04-12 MED ORDER — ONDANSETRON 4 MG PO TBDP: 4.0000 mg | ORAL_TABLET | Freq: Three times a day (TID) | ORAL | 0 refills | Status: AC | PRN

## 2016-04-12 NOTE — ED Notes (Signed)
Multiple attempts by different staff members were made to get the pt's blood. Staff was only able to obtain enough blood for a CBC w/ dif and a CMP, not enough blood for an I-Stat beta.

## 2016-04-12 NOTE — ED Notes (Signed)
Offered fluids for fluid challenge, Pt to have fluids nectar thick. Caregiver at bedside. Will admninister.

## 2016-04-12 NOTE — ED Provider Notes (Signed)
5:08 PM Patient seen in conjunction with [facility]. Patient is non-verbal -- presents with episode of bilious vomiting this afternoon. Patient has had extensive workup for episodes of constipation and diarrhea followed by vomiting. Caregiver was concerned about the appearance of the vomit today. She has had bilious vomiting in the past, but patient was acting like she was in more pain than normal today. No treatments prior to arrival. Symptoms currently improved.  Plan is for abdominal plain films, labs.  Abdomen seems to be non-tender on exam. Pt currently not in distress.   BP 115/88   Pulse 94   Temp 97.9 F (36.6 C) (Axillary)   Resp 18   LMP 03/13/2016   SpO2 99%     Renne Crigler, PA-C 04/12/16 2150

## 2016-04-12 NOTE — ED Notes (Signed)
ED Provider at bedside. 

## 2016-04-12 NOTE — ED Provider Notes (Signed)
[facility]-EMERGENCY DEPT Provider Note   CSN: [id number] Arrival date & time: 04/12/16  1353     History   Chief Complaint Chief Complaint  Patient presents with  . Emesis    HPI Level 5 caveat due to pt being nonverbal. Sheila Costa is a 31 y.o. female who presents with chief complaint vomiting. She is nonverbal with a history of MR, CP, and spastic quadriplegia. She is accompanied by her caretaker who provided history. States pt had "a huge blowout" this morning and one episode of "green vomiting". She states that the pt has chronic constipation, eventually followed by episodes of what she describes as bilious, non-bloody vomit. Pt given phenergan suppository, but had one more episode of vomiting around lunch time. She has not had anything to eat today. Stools are usually loose and non-bloody. Pt's caretaker states she has been experiencing this cycle of constipation, diarrhea, and green vomit for over 1 year now, and states "I think she has intermittent pain based on her body language and facial expressions". In the room today she is smiling and appears to be in no distress. She has had multiple negative abdominal aworkups and seen multiple gastroenterologists with no diagnosis. No fever/chills, SOB, melena, hematuria, hematochezia. LOC, seizures.   The history is provided by a caregiver.    Past Medical History:  Diagnosis Date  . Cerebral palsy (HCC)   . Constipation   . Dysphagia   . Dysphagia   . Encephalopathy   . Failure to thrive (0-17)   . Mental retardation   . Osteopenia   . Osteoporosis   . Pressure ulcer 2008   right lower extremity  . Scoliosis   . Seizure disorder (HCC)   . Seizures (HCC)   . Spastic quadriparesis (HCC)   . Static encephalopathy     Patient Active Problem List   Diagnosis Date Noted  . Encounter for nasogastric (NG) tube placement   . Mental retardation 01/20/2014  . Constipated 01/20/2014  . Enteritis 01/17/2014  . Possible UTI (lower  urinary tract infection) 01/17/2014  . UTI (lower urinary tract infection) 03/18/2011  . Hypernatremia 03/18/2011  . 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 Seizure (HCC) 03/14/2011  . Other dysphagia 03/14/2011    Past Surgical History:  Procedure Laterality Date  . BACK SURGERY    . ESOPHAGOGASTRODUODENOSCOPY (EGD) WITH PROPOFOL N/A 02/24/2015   Procedure: ESOPHAGOGASTRODUODENOSCOPY (EGD) WITH PROPOFOL;  Surgeon: Graylin Shiver, MD;  Location: [facility] ENDOSCOPY;  Service: Endoscopy;  Laterality: N/A;  . hardware in back      OB History    Gravida Para Term Preterm AB Living   0 0 0 0 0     SAB TAB Ectopic Multiple Live Births   0 0 0           Home Medications    Prior to Admission medications   Medication Sig Start Date End Date Taking? Authorizing Provider  baclofen (LIORESAL) 10 MG tablet Take 10 mg by mouth every morning.   Yes Historical Provider, MD  baclofen (LIORESAL) 20 MG tablet Take 20 mg by mouth 2 (two) times daily. Pt takes this dose at 2pm and 8pm.   Yes Historical Provider, MD  benzoyl peroxide 10 % gel Apply 1 application topically 2 (two) times daily.    Yes Historical Provider, MD  calcium carbonate (TUMS - DOSED IN MG ELEMENTAL CALCIUM) 500 MG chewable tablet Chew 1 tablet by mouth daily.    Yes Historical Provider, MD  Carbamazepine (  EQUETRO) 300 MG CP12 Take 600-900 mg by mouth See admin instructions. Take 600 mg every morning and 900 mg every evening.   Yes Historical Provider, MD  chlorhexidine (PERIDEX) 0.12 % solution Use as directed 10 mLs in the mouth or throat at bedtime.    Yes Historical Provider, MD  cholecalciferol (VITAMIN D) 1000 UNITS tablet Take 2,000 Units by mouth daily.   Yes Historical Provider, MD  diphenhydrAMINE (BENADRYL) 25 MG tablet Take 12.5 mg by mouth 2 (two) times daily.   Yes Historical Provider, MD  hydrocortisone cream 1 % Apply 1 application topically as needed for itching.    Yes Historical Provider, MD  levOCARNitine  (CARNITOR) 330 MG tablet Take 330 mg by mouth 2 (two) times daily.   Yes Historical Provider, MD  lubiprostone (AMITIZA) 24 MCG capsule Take 24 mcg by mouth 2 (two) times daily with a meal.   Yes Historical Provider, MD  magnesium oxide (MAG-OX) 400 (241.3 Mg) MG tablet Take 400 mg by mouth 3 (three) times daily.   Yes Historical Provider, MD  mupirocin ointment (BACTROBAN) 2 % Apply 1 application topically See admin instructions. Apply topically to the affected area(s) of skin on L cheek and periocular area twice daily AND as needed for dryness.   Yes Historical Provider, MD  Nutritional Supplements (ENSURE ENLIVE PO) Take 237 mLs by mouth 3 (three) times a week. Monday, Wednesday, and Friday with lunch.   Yes Historical Provider, MD  polyethylene glycol (MIRALAX / GLYCOLAX) packet Take 17 g by mouth 2 (two) times daily. Mix with 4 ounces of prune juice.   Yes Historical Provider, MD  promethazine (PHENERGAN) 25 MG suppository INSERT 1 SUPPOSITORY RECTALLY EVERY 6 HOURS AS  FOR MILD TO MODERATE CONSTIPATION 02/16/16  Yes Historical Provider, MD  scopolamine (TRANSDERM-SCOP) 1.5 MG Place 1 patch onto the skin every 3 (three) days. Behind ear.   Yes Historical Provider, MD  senna (SENOKOT) 8.6 MG TABS Take 1 tablet by mouth 2 (two) times daily.    Yes Historical Provider, MD  Skin Protectants, Misc. (PERIGUARD) OINT Apply 1 application topically 4 (four) times daily as needed (for rash/irritation).    Yes Historical Provider, MD  Valproate Sodium (VALPROIC ACID) 250 MG/5ML SOLN Take 10 mLs by mouth 3 (three) times daily. 4098,1191,4782   Yes Historical Provider, MD  Vitamins A & D (VITAMIN A & D) ointment Apply 1 application topically 3 (three) times daily as needed for dry skin.   Yes Historical Provider, MD  dicyclomine (BENTYL) 20 MG tablet Take 1 tablet (20 mg total) by mouth 2 (two) times daily as needed for spasms. Patient not taking: Reported on 04/12/2016 02/22/16   Loren Racer, MD  ondansetron  (ZOFRAN ODT) 4 MG disintegrating tablet Take 1 tablet (4 mg total) by mouth every 8 (eight) hours as needed for nausea or vomiting. 04/12/16 04/15/16  Flor Houdeshell A Aron Inge, PA-C  ondansetron (ZOFRAN) 4 MG tablet Take 1 tablet (4 mg total) by mouth every 8 (eight) hours as needed for nausea or vomiting. Patient not taking: Reported on 04/12/2016 02/22/16   Loren Racer, MD    Family History No family history on file.  Social History Social History  Substance Use Topics  . Smoking status: Never Smoker  . Smokeless tobacco: Never Used  . Alcohol use No     Allergies   Patient has no known allergies.   Review of Systems Review of Systems  Unable to perform ROS: Patient nonverbal  Physical Exam Updated Vital Signs BP 122/81 (BP Location: Left Arm)   Pulse 84   Temp 97.7 F (36.5 C) (Axillary)   Resp 16   LMP 03/13/2016   SpO2 100%   Physical Exam  Constitutional: She appears well-developed and well-nourished. No distress.  Pt is smiling and appears to be in NAD  HENT:  Head: Normocephalic and atraumatic.  Eyes: Conjunctivae and EOM are normal. Right eye exhibits no discharge. Left eye exhibits no discharge. No scleral icterus.  Neck: Neck supple. No JVD present. No tracheal deviation present.  Cardiovascular: Normal rate, regular rhythm, normal heart sounds and intact distal pulses.   Pulmonary/Chest: Effort normal and breath sounds normal.  Abdominal:  Mildly firm abdomen which caregiver states is baseline. No distension, no appreciable tenderness to palpation. No mass, rebound, or guarding. Bowel sounds hypoactive  Musculoskeletal:  Atrophic contracted upper and lower extremities.  Neurological: She is alert.   Nonverbal with spastic quadriplegia. Pt at normal baseline mental status per caregiver.  Skin: Skin is warm and dry. Capillary refill takes less than 2 seconds. She is not diaphoretic.     ED Treatments / Results  Labs (all labs ordered are listed, but only  abnormal results are displayed) Labs Reviewed  COMPREHENSIVE METABOLIC PANEL - Abnormal; Notable for the following:       Result Value   Glucose, Bld 114 (*)    ALT 11 (*)    All other components within normal limits  CBC WITH DIFFERENTIAL/PLATELET - Abnormal; Notable for the following:    Hemoglobin 11.6 (*)    HCT 35.7 (*)    MCV 77.8 (*)    MCH 25.3 (*)    All other components within normal limits  URINALYSIS, ROUTINE W REFLEX MICROSCOPIC - Abnormal; Notable for the following:    APPearance TURBID (*)    Protein, ur 100 (*)    Nitrite POSITIVE (*)    Leukocytes, UA SMALL (*)    Bacteria, UA MANY (*)    Squamous Epithelial / LPF TOO NUMEROUS TO COUNT (*)    All other components within normal limits  URINE CULTURE  POC URINE PREG, ED    EKG  EKG Interpretation None       Radiology Dg Abd 2 Views  Result Date: 04/12/2016 CLINICAL DATA:  Pt's group home manager states the pt vomited around 8AM this morning. Pt received phenergan suppository. Pt vomited again at 1330 today and was recommended to come to ER. EXAM: ABDOMEN - 2 VIEW COMPARISON:  None. FINDINGS: There is a relative paucity of bowel gas. There is no bowel dilation to suggest obstruction. There are no significant air-fluid levels on the decubitus view to suggests an adynamic ileus. There is no free air. Spine stabilization rods extend from the thoracic spine to the sacrum and upper pelvis, stable. IMPRESSION: 1. No acute findings. No evidence of bowel obstruction, significant adynamic ileus or free air. Electronically Signed   By: Amie Portland M.D.   On: 04/12/2016 19:34    Procedures Procedures (including critical care time)  Medications Ordered in ED Medications  ondansetron (ZOFRAN-ODT) disintegrating tablet 4 mg (4 mg Oral Given 04/12/16 1638)     Initial Impression / Assessment and Plan / ED Course  I have reviewed the triage vital signs and the nursing notes.  Pertinent labs & imaging results that were  available during my care of the patient were reviewed by me and considered in my medical decision making (see chart for details).  31yof w hx CP, MR, and spastic quadriplegia presents to ED with chief complaint multiple episodes of emesis with associated diarrhea. Pt has has multiple negative abdominal workups in past and has been evaluated by gastroenterology without diagnosis. Pt is nonverbal, afebrile, VSS, and pt appears to be in NAD while in ED. CBC and CMP unremarkable. UA not a clean catch, sent for culture. KUB xray shows no acute findings. No evidence of bowel obstruction, significant ileus, or free air. Low suspicion acute abdominal pathology such as appendicitis, cholecystitis, or SBO  Due to negative workup and stable vitals. Pt given zofran ODT and tolerated PO without repeat emesis. On re-evaluation she is smiling and in no apparent distress. Caretaker given rx for zofra odt prn vomiting. Discussed strict return precautions with caretaker who verbalized understanding of and agreement with plan. Pt is stable for discharge back to group home.   Final Clinical Impressions(s) / ED Diagnoses   Final diagnoses:  Vomiting  Bilious vomiting, presence of nausea not specified    New Prescriptions Discharge Medication List as of 04/12/2016  8:36 PM       Jeanie Sewer, PA-C 04/13/16 7253    Derwood Kaplan, MD 04/14/16 1220

## 2016-04-12 NOTE — ED Triage Notes (Signed)
Pt's group home manager states the pt vomited around 8AM this morning. Pt received phenergan suppository. Pt vomited again at 1330 today and was recommended to come to ER.

## 2016-04-14 LAB — URINE CULTURE

## 2017-08-12 IMAGING — CR DG ABDOMEN ACUTE W/ 1V CHEST
3 series · 3 of 3 positions shown · non-contrast
Comparison: None.

CLINICAL DATA: Chronic constipation, nausea and vomiting. History
of cerebral palsy.

EXAM:
DG ABDOMEN ACUTE W/ 1V CHEST

[w abdomen decub]
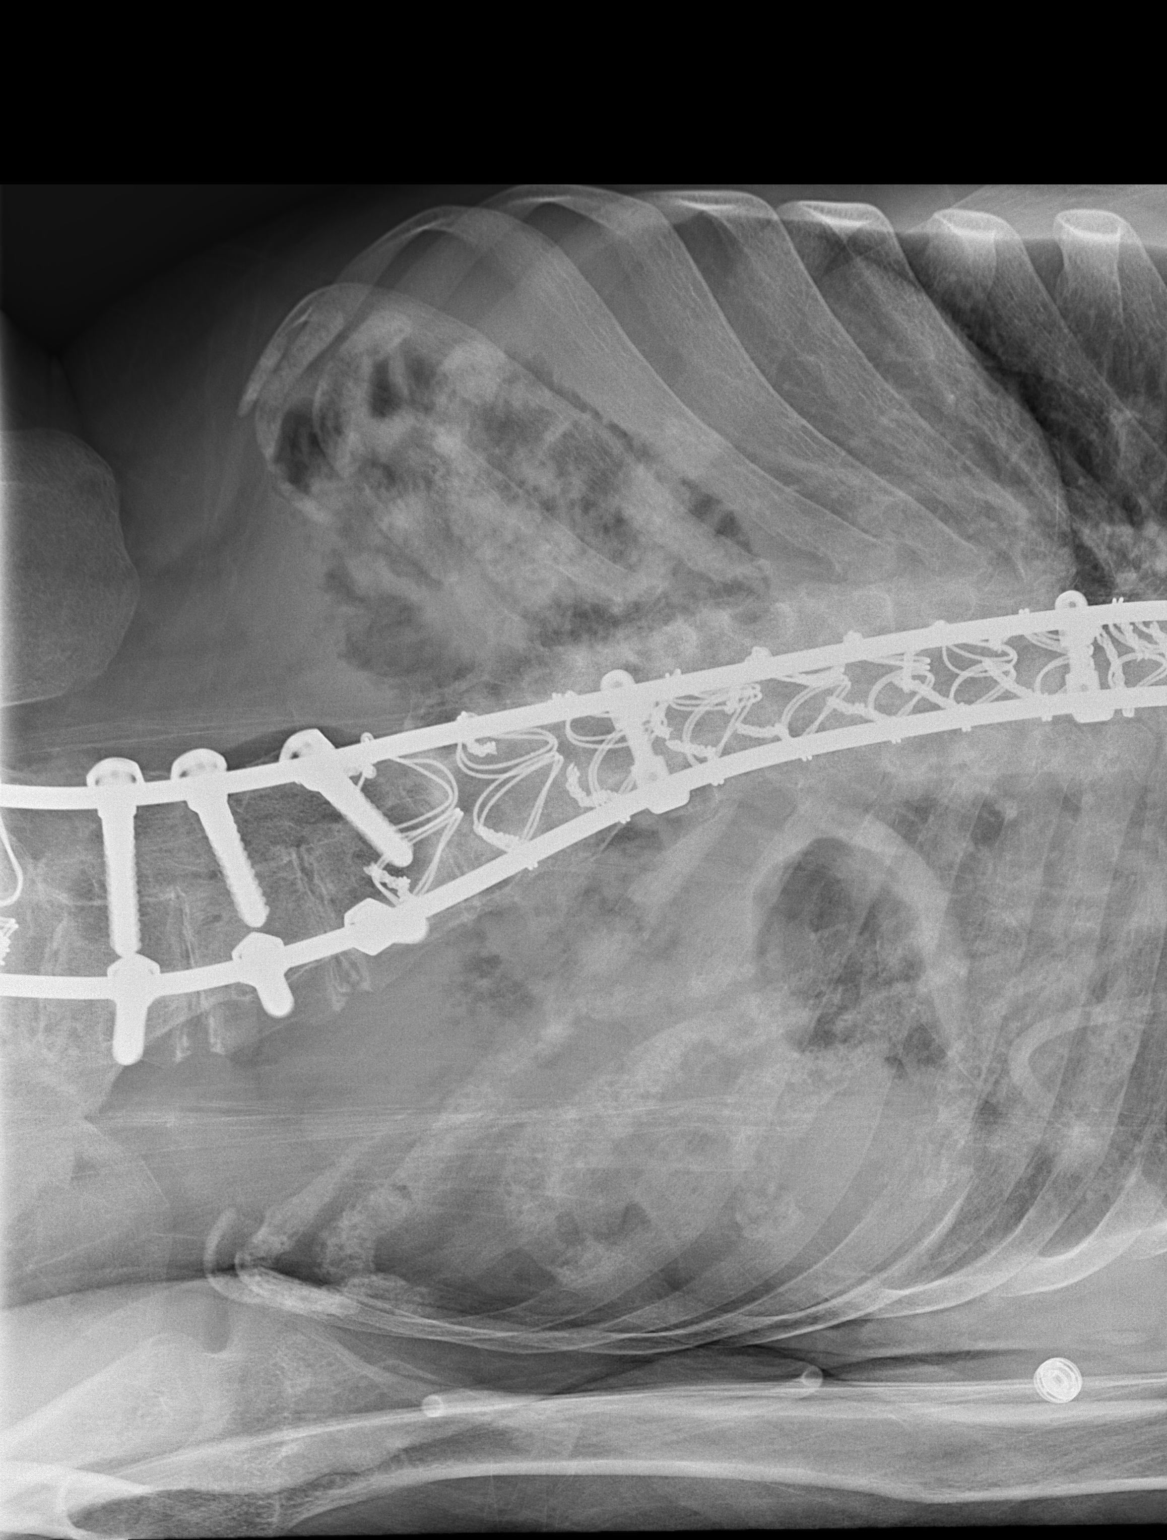

[t chest supine]
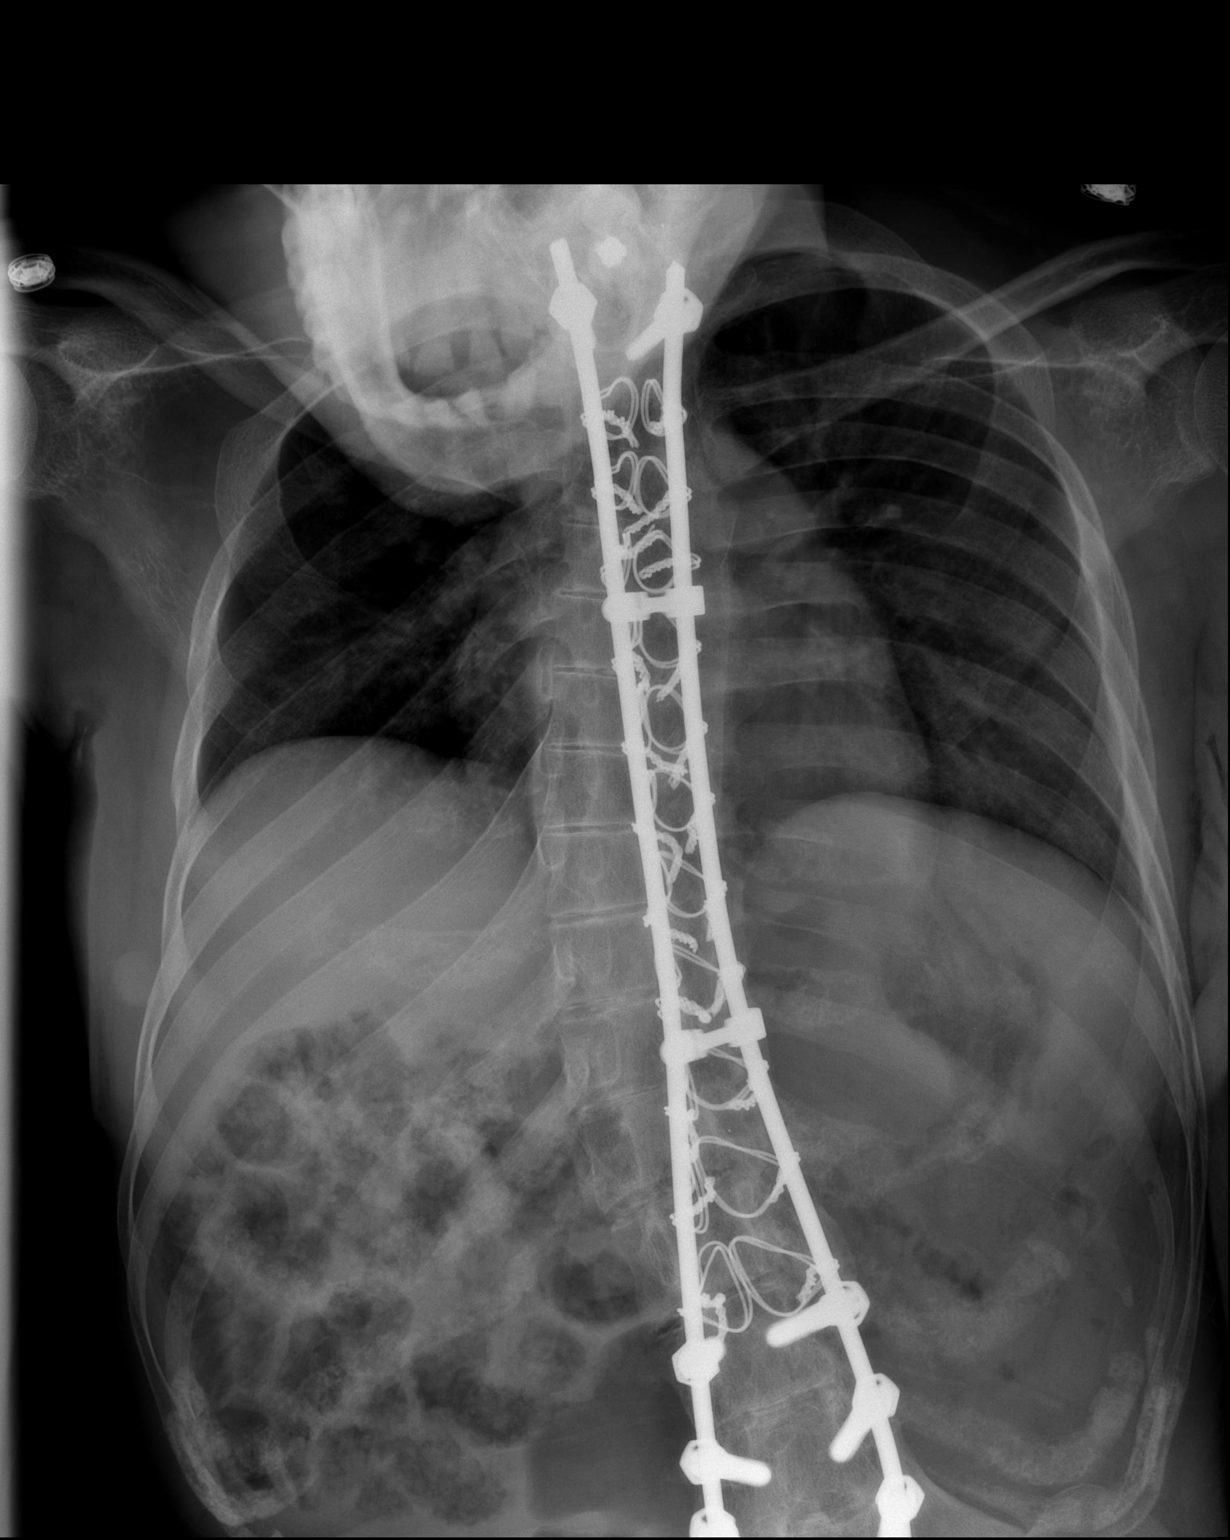

[t abdomen supine]
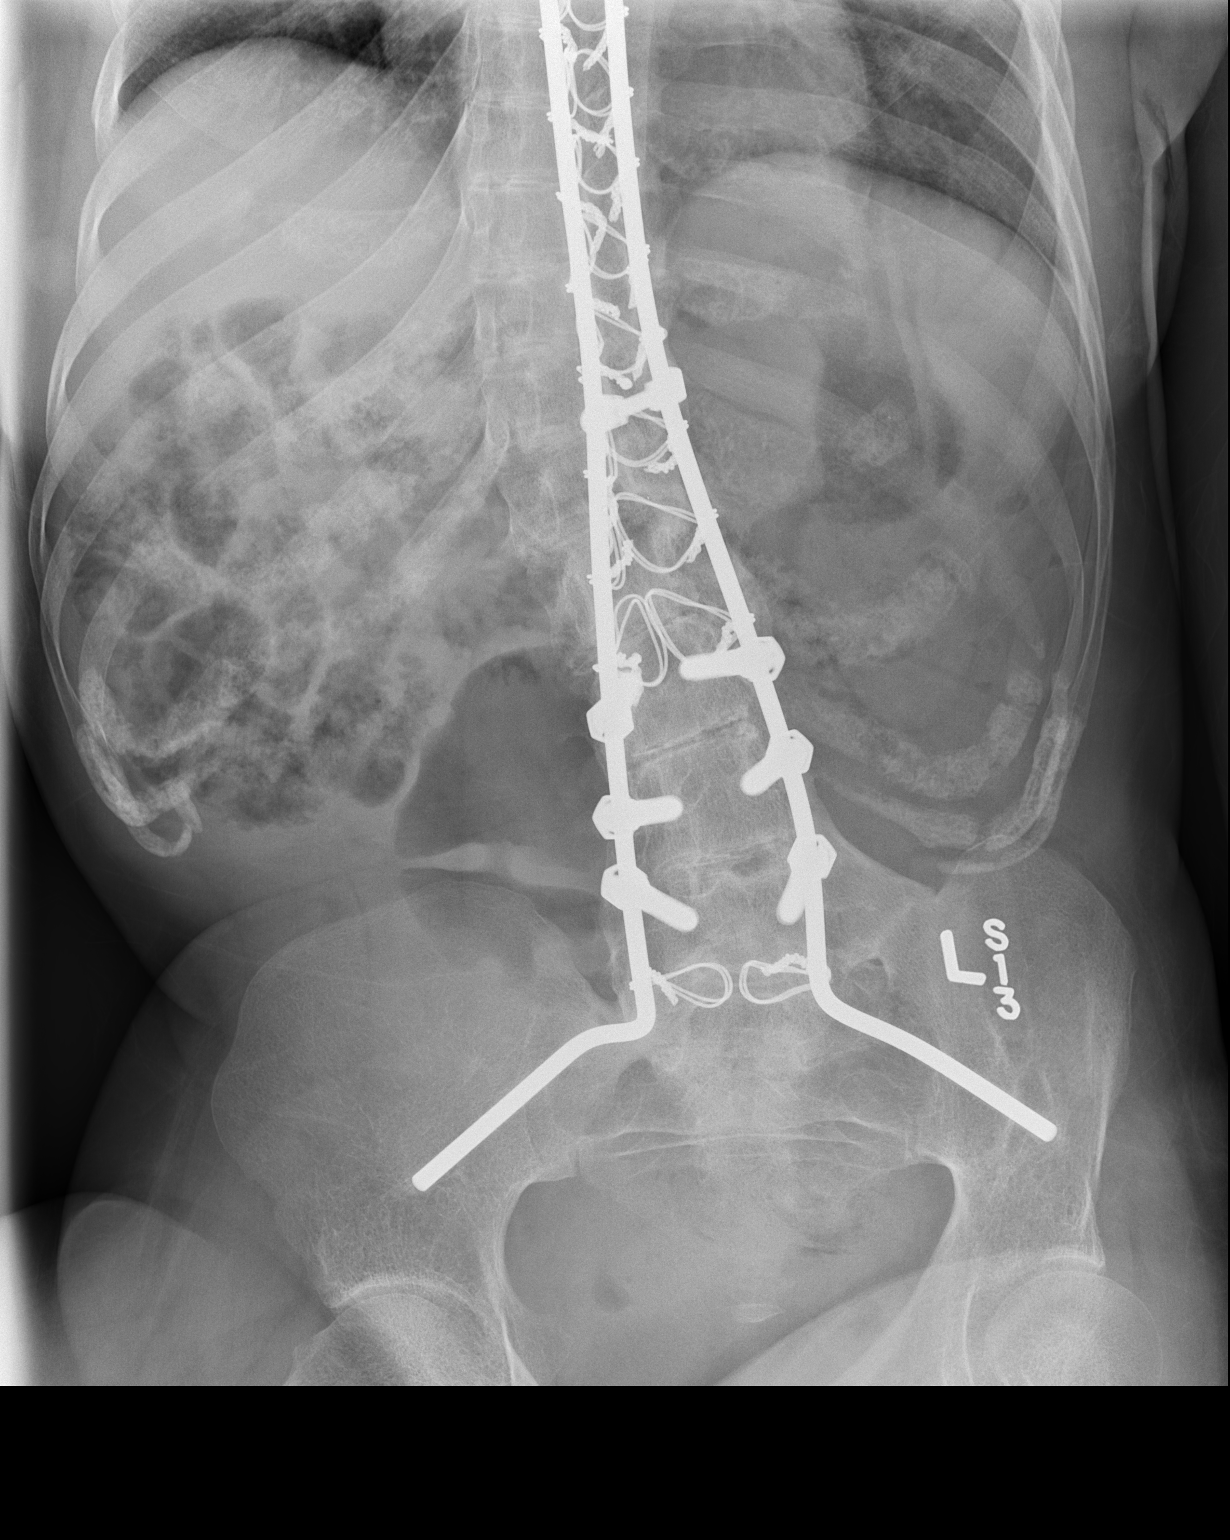

[3 of 3 positions shown; findings below may reference images not displayed]

FINDINGS: No evidence of bowel obstruction or intraperitoneal free air.
Scattered air containing small bowel loops are noted potentially
representing a mild enteritis. Low lung volumes bilaterally without
overt pulmonary edema, effusion or pulmonary consolidation. Spinal
fixation hardware is again seen along the thoracolumbar spine. There
is mild dextroconvex scoliosis with apex at the thoracolumbar
junction. No radiopaque calculi or other significant radiographic
abnormality is seen.
IMPRESSION: Scattered air containing small bowel loops without obstruction.
Findings could potentially represent a mild enteritis. No acute
cardiopulmonary disease.

## 2018-07-10 ENCOUNTER — Other Ambulatory Visit: Payer: Self-pay | Admitting: Internal Medicine

## 2018-07-15 ENCOUNTER — Telehealth: Payer: Self-pay

## 2018-07-15 NOTE — Telephone Encounter (Signed)
Sheila Costa Program manager at [facility] called inquirig about her residents COVID-19 test results.  Sheila Costa is aware results are not available. 

## 2018-07-16 ENCOUNTER — Other Ambulatory Visit: Payer: Self-pay | Admitting: *Deleted

## 2018-07-16 DIAGNOSIS — Z20822 Contact with and (suspected) exposure to covid-19: Secondary | ICD-10-CM

## 2018-07-16 LAB — NOVEL CORONAVIRUS, NAA
SARS-CoV-2, NAA: NOT DETECTED
SARS-CoV-2, NAA: NOT DETECTED

## 2018-07-16 NOTE — Telephone Encounter (Signed)
Notified Octaviano Glow, Program Manager at [phone] that pt had negative COVID-19 results.

## 2019-01-10 ENCOUNTER — Emergency Department (HOSPITAL_COMMUNITY): Payer: Medicare Other

## 2019-01-10 ENCOUNTER — Other Ambulatory Visit: Payer: Self-pay

## 2019-01-10 ENCOUNTER — Encounter (HOSPITAL_COMMUNITY): Payer: Self-pay

## 2019-01-10 ENCOUNTER — Emergency Department (HOSPITAL_COMMUNITY)
Admission: EM | Admit: 2019-01-10 | Discharge: 2019-01-11 | Disposition: A | Discharge status: Home / Self Care | Payer: Medicare Other | Attending: Emergency Medicine | Admitting: Emergency Medicine

## 2019-01-10 DIAGNOSIS — Z20828 Contact with and (suspected) exposure to other viral communicable diseases: Secondary | ICD-10-CM | POA: Insufficient documentation

## 2019-01-10 DIAGNOSIS — Z79899 Other long term (current) drug therapy: Secondary | ICD-10-CM | POA: Diagnosis not present

## 2019-01-10 DIAGNOSIS — R112 Nausea with vomiting, unspecified: Secondary | ICD-10-CM

## 2019-01-10 DIAGNOSIS — R1110 Vomiting, unspecified: Secondary | ICD-10-CM | POA: Diagnosis present

## 2019-01-10 DIAGNOSIS — G809 Cerebral palsy, unspecified: Secondary | ICD-10-CM | POA: Diagnosis not present

## 2019-01-10 DIAGNOSIS — R197 Diarrhea, unspecified: Secondary | ICD-10-CM

## 2019-01-10 LAB — CBG MONITORING, ED: Glucose-Capillary: 89 mg/dL (ref 70–99)

## 2019-01-10 LAB — POC SARS CORONAVIRUS 2 AG -  ED: SARS Coronavirus 2 Ag: NEGATIVE

## 2019-01-10 MED ORDER — SODIUM CHLORIDE 0.9 % IV BOLUS
500.0000 mL | Freq: Once | INTRAVENOUS | Status: AC
  Administered 2019-01-10: 23:00:00 500 mL via INTRAVENOUS

## 2019-01-10 NOTE — ED Notes (Signed)
IV team at bedside 

## 2019-01-10 NOTE — ED Provider Notes (Signed)
[facility] DEPT Provider Note   CSN: [id number] Arrival date & time: 01/10/19  2004  LEVEL 5 CAVEAT - MENTAL RETARDATAION/CEREBRAL PALSY History No chief complaint on file.   Sheila Costa is a 33 y.o. female.  HPI  33 year old female presents with vomiting and diarrhea.  I discussed with the group home manager, Levada Schilling, who provides the history given the patient's cerebral palsy.  Patient has a history of recurrent episodes of vomiting and diarrhea similar to today.  Today started having vomiting, diarrhea, and less p.o. intake.  No mental status changes.  No fever, cough.  They gave suppository Phenergan but they were not sure it seemed to help so she was sent in here.  Past Medical History:  Diagnosis Date  . Cerebral palsy ([place])   . Constipation   . Dysphagia   . Dysphagia   . Encephalopathy   . Failure to thrive (0-17)   . Mental retardation   . Osteopenia   . Osteoporosis   . Pressure ulcer 2008   right lower extremity  . Scoliosis   . Seizure disorder ([place])   . Seizures ([employer])   . Spastic quadriparesis ([place])   . Static encephalopathy     Patient Active Problem List   Diagnosis Date Noted  . Encounter for nasogastric (NG) tube placement   . Mental retardation 01/20/2014  . Constipated 01/20/2014  . Enteritis 01/17/2014  . Possible UTI (lower urinary tract infection) 01/17/2014  . UTI (lower urinary tract infection) 03/18/2011  . Hypernatremia 03/18/2011  . 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 Seizure ([place]) 03/14/2011  . Other dysphagia 03/14/2011    Past Surgical History:  Procedure Laterality Date  . BACK SURGERY    . ESOPHAGOGASTRODUODENOSCOPY (EGD) WITH PROPOFOL N/A 02/24/2015   Procedure: ESOPHAGOGASTRODUODENOSCOPY (EGD) WITH PROPOFOL;  Surgeon: Wonda Horner, MD;  Location: [facility] ENDOSCOPY;  Service: Endoscopy;  Laterality: N/A;  . hardware in back       OB History    Gravida  0   Para  0   Term  0   Preterm  0   AB  0   Living        SAB  0   TAB  0   Ectopic  0   Multiple      Live Births              No family history on file.  Social History   Tobacco Use  . Smoking status: Never Smoker  . Smokeless tobacco: Never Used  Substance Use Topics  . Alcohol use: No  . Drug use: No    Home Medications Prior to Admission medications   Medication Sig Start Date End Date Taking? Authorizing Provider  magnesium oxide (MAG-OX) 400 (241.3 Mg) MG tablet Take 400 mg by mouth 3 (three) times daily.   Yes [provider]  promethazine (PHENERGAN) 25 MG suppository Place 25 mg rectally every 6 (six) hours as needed for nausea or vomiting.  02/16/16  Yes [provider]  senna (SENOKOT) 8.6 MG TABS Take 1 tablet by mouth 2 (two) times daily.    Yes [provider]  baclofen (LIORESAL) 10 MG tablet Take 10 mg by mouth every morning.    [provider]  baclofen (LIORESAL) 20 MG tablet Take 20 mg by mouth 2 (two) times daily. Pt takes this dose at 2pm and 8pm.    [provider]  benzoyl peroxide 10 % gel Apply 1  application topically 2 (two) times daily.     [provider]  calcium carbonate (TUMS - DOSED IN MG ELEMENTAL CALCIUM) 500 MG chewable tablet Chew 1 tablet by mouth daily.     [provider]  Carbamazepine (EQUETRO) 300 MG CP12 Take 600-900 mg by mouth See admin instructions. Take 600 mg every morning and 900 mg every evening.    [provider]  chlorhexidine (PERIDEX) 0.12 % solution Use as directed 10 mLs in the mouth or throat at bedtime.     [provider]  cholecalciferol (VITAMIN D) 1000 UNITS tablet Take 2,000 Units by mouth daily.    [provider]  dicyclomine (BENTYL) 20 MG tablet Take 1 tablet (20 mg total) by mouth 2 (two) times daily as needed for spasms. Patient not taking: Reported on 04/12/2016 02/22/16   Loren RacerYelverton, David, MD  diphenhydrAMINE (BENADRYL) 25 MG tablet  Take 12.5 mg by mouth 2 (two) times daily.    [provider]  hydrocortisone cream 1 % Apply 1 application topically as needed for itching.     [provider]  levOCARNitine (CARNITOR) 330 MG tablet Take 330 mg by mouth 2 (two) times daily.    [provider]  lubiprostone (AMITIZA) 24 MCG capsule Take 24 mcg by mouth 2 (two) times daily with a meal.    [provider]  mupirocin ointment (BACTROBAN) 2 % Apply 1 application topically See admin instructions. Apply topically to the affected area(s) of skin on L cheek and periocular area twice daily AND as needed for dryness.    [provider]  Nutritional Supplements (ENSURE ENLIVE PO) Take 237 mLs by mouth 3 (three) times a week. Monday, Wednesday, and Friday with lunch.    [provider]  ondansetron (ZOFRAN) 4 MG tablet Take 1 tablet (4 mg total) by mouth every 8 (eight) hours as needed for nausea or vomiting. Patient not taking: Reported on 04/12/2016 02/22/16   Loren RacerYelverton, David, MD  polyethylene glycol [facility](MIRALAX / Ethelene HalGLYCOLAX) packet Take 17 g by mouth 2 (two) times daily. Mix with 4 ounces of prune juice.    [provider]  scopolamine (TRANSDERM-SCOP) 1.5 MG Place 1 patch onto the skin every 3 (three) days. Behind ear.    [provider]  Skin Protectants, Misc. (PERIGUARD) OINT Apply 1 application topically 4 (four) times daily as needed (for rash/irritation).     [provider]  Valproate Sodium (VALPROIC ACID) 250 MG/5ML SOLN Take 10 mLs by mouth 3 (three) times daily. 4098,1191,47820800,1400,2000    [provider]  Vitamins A & D (VITAMIN A & D) ointment Apply 1 application topically 3 (three) times daily as needed for dry skin.    [provider]    Allergies    Patient has no known allergies.  Review of Systems   Review of Systems  Unable to perform ROS: Mental status change    Physical Exam Updated Vital Signs BP 103/83   Pulse 86   Temp 98.3  F (36.8 C) (Rectal)   Resp 16   SpO2 98%   Physical Exam Vitals and nursing note reviewed. Exam conducted with a chaperone present.  Constitutional:      Appearance: She is cachectic.  HENT:     Head: Normocephalic and atraumatic.     Right Ear: External ear normal.     Left Ear: External ear normal.     Nose: Nose normal.  Eyes:     General:  Right eye: No discharge.        Left eye: No discharge.  Cardiovascular:     Rate and Rhythm: Normal rate and regular rhythm.     Heart sounds: Normal heart sounds.  Pulmonary:     Effort: Pulmonary effort is normal.     Breath sounds: Normal breath sounds.  Abdominal:     General: There is no distension.     Palpations: Abdomen is soft.     Tenderness: There is no abdominal tenderness.  Genitourinary:    Comments: No stool or gross blood on digital rectal exam Skin:    General: Skin is warm and dry.  Neurological:     Mental Status: She is alert.     Comments: Awake, but does not follow commands. contractures  Psychiatric:        Mood and Affect: Mood is not anxious.     ED Results / Procedures / Treatments   Labs (all labs ordered are listed, but only abnormal results are displayed) Labs Reviewed  CULTURE, BLOOD (ROUTINE X 2)  CULTURE, BLOOD (ROUTINE X 2)  SARS CORONAVIRUS 2 (TAT 6-24 HRS)  URINALYSIS, ROUTINE W REFLEX MICROSCOPIC  COMPREHENSIVE METABOLIC PANEL  LIPASE, BLOOD  CBC WITH DIFFERENTIAL/PLATELET  I-STAT BETA HCG BLOOD, ED ([facility], [facility], [facility] ONLY)  POC SARS CORONAVIRUS 2 AG -  ED  CBG MONITORING, ED    EKG None  Radiology DG Chest Portable 1 View  Result Date: 01/10/2019 CLINICAL DATA:  33 year old female with fever. EXAM: PORTABLE CHEST 1 VIEW COMPARISON:  Chest radiograph dated 11/28/2015 FINDINGS: The patient is rotated. Mild hazy density primarily within the left lung, likely artifactual. Atypical infection is less likely. Clinical correlation is recommended. No focal consolidation, pleural  effusion, or pneumothorax. Stable cardiac silhouette. Spinal Harrington rods noted. No acute osseous pathology. IMPRESSION: 1. No focal consolidation. 2. Hazy density within the left lung, likely artifactual or atelectasis. Atypical infection is less likely. Electronically Signed   By: Elgie Collard M.D.   On: 01/10/2019 21:13   DG Abd Portable 2 Views  Result Date: 01/10/2019 CLINICAL DATA:  Vomiting EXAM: PORTABLE ABDOMEN - 2 VIEW COMPARISON:  April 12, 2016 FINDINGS: Again noted is a relative paucity of bowel gas seen within the mid abdomen there is a moderate amount of stool seen within the sigmoid colon and rectum. No dilated loops of bowel. Overlying thoracolumbar spine fixation hardware seen. No radio-opaque calculi or other significant radiographic abnormality is seen. IMPRESSION: Nonspecific, nonobstructive bowel gas pattern. Moderate to large amount of rectal stool. Electronically Signed   By: Jonna Clark M.D.   On: 01/10/2019 22:33    Procedures Procedures (including critical care time)  Angiocath insertion Performed by: Audree Camel  Consent: Verbal consent obtained. Risks and benefits: risks, benefits and alternatives were discussed Time out: Immediately prior to procedure a "time out" was called to verify the correct patient, procedure, equipment, support staff and site/side marked as required.  Preparation: Patient was prepped and draped in the usual sterile fashion.  Vein Location: right basilic  Ultrasound Guided  Gauge: 20  Normal blood return and flush without difficulty Patient tolerance: Patient tolerated the procedure well with no immediate complications.    Medications Ordered in ED Medications  sodium chloride 0.9 % bolus 500 mL (500 mLs Intravenous New Bag/Given 01/10/19 2318)    ED Course  I have reviewed the triage vital signs and the nursing notes.  Pertinent labs & imaging results that were available during my  care of the patient were  reviewed by me and considered in my medical decision making (see chart for details).    MDM Rules/Calculators/A&P                      Besides chronic findings, patient's exam is pretty unremarkable.  Vital signs are stable.  She will be given fluids given her report of vomiting/diarrhea.  Abdominal x-ray does not show an obvious obstructive pattern.  There is no stool in the rectal vault/no impaction.  At this point, labs have been obtained.  If these are okay and no further vomiting, I think she is stable for discharge home.  Chart review and talking with the group home indicates this is a recurrent problem for her. Care to Dr. Elesa Massed. Final Clinical Impression(s) / ED Diagnoses Final diagnoses:  None    Rx / DC Orders ED Discharge Orders    None       Pricilla Loveless, MD 01/10/19 2356

## 2019-01-10 NOTE — ED Provider Notes (Signed)
11:48 PM  Assumed care from Dr. Regenia Skeeter.  Patient is a 33 y.o. F with CP, nonverbal with V, D from group home.  Takes pureed food by mouth through a straw.  Labs and urine pending.  No V here.  Received a Phenergan suppository at the group home.  Has not received any antiemetics here in the emergency department.  12:50 AM  Nurse and tech unable to obtain catheterized urine specimen.  Bladder scan only reveals 30 mL.  Will p.o. challenge and give IV fluids.  4:30 AM  Pt has been catheterized several times for urine and we continue to get what appears to be liquid stool.  Her anatomy seems normal externally.  There is no stool coming from her urethra.  Her abdominal exam continues to be benign.  No known history of colovesicular fistula but will obtain CT scan.   CT scan shows no acute abnormality but limited secondary to motion artifact and patient's Harrington rods.  She has not had any vomiting in the emergency department has had some loose stool.  Will give Imodium prior to discharge.  Discussed with the radiologist who states that he does not see any air in her bladder and after multiple catheterization attempts, there should be air present.  He also does not see any fistulas present.  States bladder wall is nice and thin and does not appear to show any sign of cystitis.  Suspect that nurse was not able to appropriately catheterize the urethra due to patient being contracted causing the urethra to be difficult to access.  7:00 AM  Discussed with legal guardian and updated her Lavinia Sharps, patient's aunt) who hasn't been able to see her since this summer due to [place].  Patient's mother passed away in 05-21-95.  Will also update group home at [phone].  I feel patient is safe to be discharged home.   At this time, I do not feel there is any life-threatening condition present. I have reviewed, interpreted and discussed all results (EKG, imaging, lab, urine as appropriate) and exam findings with  family. I have reviewed nursing notes and appropriate previous records.  I feel the patient is safe to be discharged home without further emergent workup and can continue workup as an outpatient as needed. Discussed usual and customary return precautions. Group home staff and family verbalize understanding and are comfortable with this plan.  Outpatient follow-up has been provided as needed. All questions have been answered.    Vasti Yagi, Delice Bison, DO 01/11/19 [phone]

## 2019-01-10 NOTE — ED Triage Notes (Signed)
Per EMS, Pt is from an assisted living home called [facility]. Yesterday pt began not acting herself, not eating, or "as happy". Pt began N/V/D, and fever today. Pt is non-verbal, will moan and groan. Number to facility [phone], Wynonia Lawman is facility manager.

## 2019-01-10 NOTE — ED Notes (Signed)
Phlebotomy contacted for blood collection.

## 2019-01-10 NOTE — ED Notes (Signed)
Two attempts at IV access without success IV team contacted.

## 2019-01-11 ENCOUNTER — Emergency Department (HOSPITAL_COMMUNITY): Payer: Medicare Other

## 2019-01-11 ENCOUNTER — Encounter (HOSPITAL_COMMUNITY): Payer: Self-pay

## 2019-01-11 DIAGNOSIS — R112 Nausea with vomiting, unspecified: Secondary | ICD-10-CM | POA: Diagnosis not present

## 2019-01-11 LAB — COMPREHENSIVE METABOLIC PANEL
ALT: 11 U/L (ref 0–44)
AST: 27 U/L (ref 15–41)
Albumin: 3.8 g/dL (ref 3.5–5.0)
Alkaline Phosphatase: 49 U/L (ref 38–126)
Anion gap: 10 (ref 5–15)
BUN: 11 mg/dL (ref 6–20)
CO2: 24 mmol/L (ref 22–32)
Calcium: 9.3 mg/dL (ref 8.9–10.3)
Chloride: 104 mmol/L (ref 98–111)
Creatinine, Ser: 0.5 mg/dL (ref 0.44–1.00)
GFR calc Af Amer: >60 mL/min (ref >60)
GFR calc non Af Amer: >60 mL/min (ref >60)
Glucose, Bld: 100 mg/dL — ABNORMAL HIGH (ref 70–99)
Potassium: 5 mmol/L (ref 3.5–5.1)
Sodium: 138 mmol/L (ref 135–145)
Total Bilirubin: 0.4 mg/dL (ref 0.3–1.2)
Total Protein: 7.1 g/dL (ref 6.5–8.1)

## 2019-01-11 LAB — CBC WITH DIFFERENTIAL/PLATELET
Abs Immature Granulocytes: 0.03 10*3/uL (ref 0.00–0.07)
Basophils Absolute: 0 10*3/uL (ref 0.0–0.1)
Basophils Relative: 0 %
Eosinophils Absolute: 0 10*3/uL (ref 0.0–0.5)
Eosinophils Relative: 0 %
HCT: 36.5 % (ref 36.0–46.0)
Hemoglobin: 11.5 g/dL — ABNORMAL LOW (ref 12.0–15.0)
Immature Granulocytes: 0 %
Lymphocytes Relative: 18 %
Lymphs Abs: 1.3 10*3/uL (ref 0.7–4.0)
MCH: 25.8 pg — ABNORMAL LOW (ref 26.0–34.0)
MCHC: 31.5 g/dL (ref 30.0–36.0)
MCV: 81.8 fL (ref 80.0–100.0)
Monocytes Absolute: 0.5 10*3/uL (ref 0.1–1.0)
Monocytes Relative: 7 %
Neutro Abs: 5.4 10*3/uL (ref 1.7–7.7)
Neutrophils Relative %: 75 %
Platelets: 156 10*3/uL (ref 150–400)
RBC: 4.46 MIL/uL (ref 3.87–5.11)
RDW: 13.9 % (ref 11.5–15.5)
WBC: 7.2 10*3/uL (ref 4.0–10.5)
nRBC: 0 % (ref 0.0–0.2)

## 2019-01-11 LAB — SARS CORONAVIRUS 2 (TAT 6-24 HRS): SARS Coronavirus 2: NEGATIVE

## 2019-01-11 LAB — LIPASE, BLOOD: Lipase: 21 U/L (ref 11–51)

## 2019-01-11 LAB — I-STAT BETA HCG BLOOD, ED (MC, WL, AP ONLY): I-stat hCG, quantitative: <5 m[IU]/mL (ref <5)

## 2019-01-11 MED ORDER — LOPERAMIDE HCL 2 MG PO CAPS: 2.0000 mg | ORAL_CAPSULE | Freq: Four times a day (QID) | ORAL | 0 refills | Status: DC | PRN

## 2019-01-11 MED ORDER — SODIUM CHLORIDE 0.9 % IV BOLUS (SEPSIS)
1000.0000 mL | Freq: Once | INTRAVENOUS | Status: AC
  Administered 2019-01-11: 1000 mL via INTRAVENOUS

## 2019-01-11 MED ORDER — PROMETHAZINE HCL 25 MG RE SUPP: 25.0000 mg | Freq: Four times a day (QID) | RECTAL | 0 refills | Status: DC | PRN

## 2019-01-11 MED ORDER — LOPERAMIDE HCL 2 MG PO CAPS
4.0000 mg | ORAL_CAPSULE | Freq: Once | ORAL | Status: AC
  Administered 2019-01-11: 08:00:00 4 mg via ORAL
  Filled 2019-01-11: qty 2

## 2019-01-11 MED ORDER — SODIUM CHLORIDE 0.9 % IV BOLUS (SEPSIS)
1000.0000 mL | Freq: Once | INTRAVENOUS | Status: AC
  Administered 2019-01-11: 02:00:00 1000 mL via INTRAVENOUS

## 2019-01-11 MED ORDER — SODIUM CHLORIDE (PF) 0.9 % IJ SOLN
INTRAMUSCULAR | Status: AC
  Filled 2019-01-11: qty 50

## 2019-01-11 MED ORDER — IOHEXOL 300 MG/ML  SOLN
75.0000 mL | Freq: Once | INTRAMUSCULAR | Status: AC | PRN
  Administered 2019-01-11: 07:00:00 75 mL via INTRAVENOUS

## 2019-01-11 NOTE — Discharge Instructions (Signed)
Patient's labs and CT scan today were normal.

## 2019-01-11 NOTE — ED Notes (Signed)
Pt was fluid challenged. Pt unable to drink water very well, but was able to swallow some without any N/V.

## 2019-01-11 NOTE — ED Notes (Signed)
[phone] Nurse Estill Bamberg

## 2019-01-11 NOTE — ED Notes (Signed)
[facility] called for transport.  

## 2019-01-16 LAB — CULTURE, BLOOD (ROUTINE X 2)
Culture: NO GROWTH
Special Requests: ADEQUATE

## 2020-04-22 ENCOUNTER — Emergency Department (HOSPITAL_COMMUNITY)
Admission: EM | Admit: 2020-04-22 | Discharge: 2020-04-23 | Disposition: A | Discharge status: Home / Self Care | Payer: Medicare Other | Attending: Emergency Medicine | Admitting: Emergency Medicine

## 2020-04-22 ENCOUNTER — Emergency Department (HOSPITAL_COMMUNITY): Payer: Medicare Other

## 2020-04-22 ENCOUNTER — Other Ambulatory Visit: Payer: Self-pay

## 2020-04-22 ENCOUNTER — Encounter (HOSPITAL_COMMUNITY): Payer: Self-pay

## 2020-04-22 DIAGNOSIS — R1084 Generalized abdominal pain: Secondary | ICD-10-CM | POA: Insufficient documentation

## 2020-04-22 DIAGNOSIS — M62462 Contracture of muscle, left lower leg: Secondary | ICD-10-CM | POA: Insufficient documentation

## 2020-04-22 DIAGNOSIS — M62422 Contracture of muscle, left upper arm: Secondary | ICD-10-CM | POA: Diagnosis not present

## 2020-04-22 DIAGNOSIS — M62461 Contracture of muscle, right lower leg: Secondary | ICD-10-CM | POA: Insufficient documentation

## 2020-04-22 DIAGNOSIS — R109 Unspecified abdominal pain: Secondary | ICD-10-CM

## 2020-04-22 DIAGNOSIS — D649 Anemia, unspecified: Secondary | ICD-10-CM | POA: Diagnosis not present

## 2020-04-22 DIAGNOSIS — M62421 Contracture of muscle, right upper arm: Secondary | ICD-10-CM | POA: Insufficient documentation

## 2020-04-22 LAB — CBC WITH DIFFERENTIAL/PLATELET
Abs Immature Granulocytes: 0.01 10*3/uL (ref 0.00–0.07)
Basophils Absolute: 0 10*3/uL (ref 0.0–0.1)
Basophils Relative: 1 %
Eosinophils Absolute: 0.1 10*3/uL (ref 0.0–0.5)
Eosinophils Relative: 2 %
HCT: 36.2 % (ref 36.0–46.0)
Hemoglobin: 11.9 g/dL — ABNORMAL LOW (ref 12.0–15.0)
Immature Granulocytes: 0 %
Lymphocytes Relative: 53 %
Lymphs Abs: 3.2 10*3/uL (ref 0.7–4.0)
MCH: 26.5 pg (ref 26.0–34.0)
MCHC: 32.9 g/dL (ref 30.0–36.0)
MCV: 80.6 fL (ref 80.0–100.0)
Monocytes Absolute: 0.7 10*3/uL (ref 0.1–1.0)
Monocytes Relative: 12 %
Neutro Abs: 2 10*3/uL (ref 1.7–7.7)
Neutrophils Relative %: 32 %
Platelets: 229 10*3/uL (ref 150–400)
RBC: 4.49 MIL/uL (ref 3.87–5.11)
RDW: 13.6 % (ref 11.5–15.5)
WBC: 6.2 10*3/uL (ref 4.0–10.5)
nRBC: 0 % (ref 0.0–0.2)

## 2020-04-22 LAB — COMPREHENSIVE METABOLIC PANEL
ALT: 9 U/L (ref 0–44)
AST: 19 U/L (ref 15–41)
Albumin: 3.7 g/dL (ref 3.5–5.0)
Alkaline Phosphatase: 32 U/L — ABNORMAL LOW (ref 38–126)
Anion gap: 8 (ref 5–15)
BUN: 8 mg/dL (ref 6–20)
CO2: 27 mmol/L (ref 22–32)
Calcium: 8.9 mg/dL (ref 8.9–10.3)
Chloride: 103 mmol/L (ref 98–111)
Creatinine, Ser: 0.43 mg/dL — ABNORMAL LOW (ref 0.44–1.00)
GFR, Estimated: >60 mL/min (ref >60)
Glucose, Bld: 83 mg/dL (ref 70–99)
Potassium: 3.7 mmol/L (ref 3.5–5.1)
Sodium: 138 mmol/L (ref 135–145)
Total Bilirubin: 0.5 mg/dL (ref 0.3–1.2)
Total Protein: 6.8 g/dL (ref 6.5–8.1)

## 2020-04-22 LAB — HCG, QUANTITATIVE, PREGNANCY: hCG, Beta Chain, Quant, S: <1 m[IU]/mL (ref <5)

## 2020-04-22 LAB — LIPASE, BLOOD: Lipase: 27 U/L (ref 11–51)

## 2020-04-22 MED ORDER — SODIUM CHLORIDE 0.9 % IV BOLUS
1000.0000 mL | Freq: Once | INTRAVENOUS | Status: AC
  Administered 2020-04-22: 1000 mL via INTRAVENOUS

## 2020-04-22 MED ORDER — FENTANYL CITRATE (PF) 100 MCG/2ML IJ SOLN
25.0000 ug | Freq: Once | INTRAMUSCULAR | Status: AC
  Administered 2020-04-22: 25 ug via INTRAVENOUS
  Filled 2020-04-22: qty 2

## 2020-04-22 NOTE — ED Notes (Signed)
In and Out catheter unsuccessful. Loleta Dicker, PA at bedside.

## 2020-04-22 NOTE — ED Triage Notes (Signed)
Coming [facility], staff said she was positive for ileus since thursday

## 2020-04-22 NOTE — ED Provider Notes (Signed)
[facility]-EMERGENCY DEPT Provider Note   CSN: [id number] Arrival date & time: 04/22/20  1824     History Chief Complaint  Patient presents with  . Abdominal Pain    Sheila Costa is a 35 y.o. female who presents EMS from facility for concern for possible ileus.  Patient with history of cerebral palsy and noncontributory to her history.  Patient's guardian, Annice Pih (her aunt) is at the bedside who phoned the patient's facility nurse who provided this collateral information.  According to her nurse, the patient experienced 3 episodes of emesis on Thursday, which appeared and smelled like bowel contents.  She subsequently had a large episode of diarrhea which overflowed the bed, and covered her entire back and floor with stool.  She was subsequently administered an enema with another large bowel movement.  She has not had any stool since that time.  X-ray was performed today at her facility which showed signs concerning for possible ileus.  Patient was transferred to the emergency department.  According to her she has been more irritable, crying out and apparently in pain since Thursday.  I personally reviewed this patient's medical records.  History of cerebral palsy with seizure disorder, history of chronic constipation, recurrent urinary tract infections.  HPI     Past Medical History:  Diagnosis Date  . Cerebral palsy (HCC)   . Constipation   . Dysphagia   . Dysphagia   . Encephalopathy   . Failure to thrive (0-17)   . Mental retardation   . Osteopenia   . Osteoporosis   . Pressure ulcer 2008   right lower extremity  . Scoliosis   . Seizure disorder (HCC)   . Seizures (HCC)   . Spastic quadriparesis (HCC)   . Static encephalopathy     Patient Active Problem List   Diagnosis Date Noted  . Encounter for nasogastric (NG) tube placement   . Mental retardation 01/20/2014  . Constipated 01/20/2014  . Enteritis 01/17/2014  . Possible UTI (lower  urinary tract infection) 01/17/2014  . UTI (lower urinary tract infection) 03/18/2011  . Hypernatremia 03/18/2011  . 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 Seizure (HCC) 03/14/2011  . Other dysphagia 03/14/2011    Past Surgical History:  Procedure Laterality Date  . BACK SURGERY    . ESOPHAGOGASTRODUODENOSCOPY (EGD) WITH PROPOFOL N/A 02/24/2015   Procedure: ESOPHAGOGASTRODUODENOSCOPY (EGD) WITH PROPOFOL;  Surgeon: Graylin Shiver, MD;  Location: [facility] ENDOSCOPY;  Service: Endoscopy;  Laterality: N/A;  . hardware in back       OB History    Gravida  0   Para  0   Term  0   Preterm  0   AB  0   Living        SAB  0   IAB  0   Ectopic  0   Multiple      Live Births              No family history on file.  Social History   Tobacco Use  . Smoking status: Never Smoker  . Smokeless tobacco: Never Used  Substance Use Topics  . Alcohol use: No  . Drug use: No    Home Medications Prior to Admission medications   Medication Sig Start Date End Date Taking? Authorizing Provider  baclofen (LIORESAL) 10 MG tablet Take 10 mg by mouth every morning.   Yes [provider]  baclofen (LIORESAL) 20 MG tablet Take 20 mg by mouth 2 (two)  times daily. Pt takes this dose at 2pm and 8pm.   Yes [provider]  benzoyl peroxide 10 % gel Apply 1 application topically 2 (two) times daily.    Yes [provider]  Carbamazepine (EQUETRO) 300 MG CP12 Take 600 mg by mouth in the morning.   Yes [provider]  Carbamazepine (EQUETRO) 300 MG CP12 Take 900 mg by mouth every evening.   Yes [provider]  chlorhexidine (PERIDEX) 0.12 % solution Use as directed 10 mLs in the mouth or throat at bedtime.    Yes [provider]  cholecalciferol (VITAMIN D) 1000 UNITS tablet Take 2,000 Units by mouth daily.   Yes [provider]  diphenhydrAMINE (BENADRYL) 25 MG tablet Take 25 mg by mouth every evening.   Yes [provider]  levOCARNitine (CARNITOR) 1 GM/10ML solution Take 500 mg by mouth every evening. 04/21/20  Yes [provider]  magnesium oxide (MAG-OX) 400 (241.3 Mg) MG tablet Take 400 mg by mouth 3 (three) times daily.   Yes [provider]  Multiple Vitamins-Minerals (MULTIVITAMIN WITH MINERALS) tablet Take 1 tablet by mouth daily.   Yes [provider]  polyethylene glycol (MIRALAX / GLYCOLAX) packet Take 17 g by mouth 2 (two) times daily. Mix with 4 ounces of prune juice.   Yes [provider]  scopolamine (TRANSDERM-SCOP) 1.5 MG Place 1 patch onto the skin every 3 (three) days. Behind ear.   Yes [provider]  senna (SENOKOT) 8.6 MG TABS Take 1 tablet by mouth 2 (two) times daily.    Yes [provider]  triazolam (HALCION) 0.25 MG tablet Take 0.25 mg by mouth See admin instructions. Take 1 tablet by mouth prior to dental procedure as directed by the [facility] 01/11/20  Yes [provider]  TRULANCE 3 MG TABS Take 1 tablet by mouth daily. 04/07/20  Yes [provider]  valproic acid (DEPAKENE) 250 MG/5ML solution Take 500 mg by mouth 3 (three) times daily.   Yes [provider]  dicyclomine (BENTYL) 20 MG tablet Take 1 tablet (20 mg total) by mouth 2 (two) times daily as needed for spasms. Patient not taking: No sig reported 02/22/16   Loren RacerYelverton, David, MD  hydrocortisone cream 1 % Apply 1 application topically as needed for itching.  Patient not taking: Reported on 04/22/2020    [provider]  loperamide (IMODIUM) 2 MG capsule Take 1 capsule (2 mg total) by mouth 4 (four) times daily as needed for diarrhea or loose stools. Patient not taking: Reported on 04/22/2020 01/11/19   Ward, Layla MawKristen N, DO  lubiprostone (AMITIZA) 24 MCG capsule Take 24 mcg by mouth 2 (two) times daily with a meal. Patient not taking: No sig reported    [provider]  Nutritional Supplements (ENSURE ENLIVE PO) Take 237 mLs  by mouth 3 (three) times a week. Monday, Wednesday, and Friday with lunch. Patient not taking: Reported on 04/22/2020    [provider]  ondansetron (ZOFRAN-ODT) 4 MG disintegrating tablet Take by mouth. Patient not taking: Reported on 04/22/2020 04/21/20   [provider]  promethazine (PHENERGAN) 25 MG suppository Place 25 mg rectally every 6 (six) hours as needed for nausea or vomiting.  Patient not taking: Reported on 04/22/2020 02/16/16   [provider]  promethazine (PHENERGAN) 25 MG suppository Place 1 suppository (25 mg total) rectally every 6 (six) hours as needed for nausea or vomiting. Patient not taking: Reported on 04/22/2020 01/11/19  Ward, Layla Maw, DO  promethazine (PHENERGAN) 25 MG tablet Take by mouth. Patient not taking: Reported on 04/22/2020 01/27/20   [provider]    Allergies    Patient has no known allergies.  Review of Systems   Review of Systems  Unable to perform ROS: Patient nonverbal    Physical Exam Updated Vital Signs BP (!) 115/53   Pulse (!) 59   Temp 97.6 F (36.4 C) (Oral)   Resp 20   SpO2 100%   Physical Exam Vitals and nursing note reviewed. Exam conducted with a chaperone present.  Constitutional:      General: She is awake.     Appearance: She is underweight.  HENT:     Head: Normocephalic and atraumatic.     Nose: Nose normal.     Mouth/Throat:     Mouth: Mucous membranes are dry.     Pharynx: Oropharynx is clear. Uvula midline. No oropharyngeal exudate, posterior oropharyngeal erythema or uvula swelling.     Tonsils: No tonsillar exudate.  Eyes:     General: Lids are normal. Vision grossly intact.        Right eye: No discharge.        Left eye: No discharge.     Extraocular Movements: Extraocular movements intact.     Conjunctiva/sclera: Conjunctivae normal.     Pupils: Pupils are equal, round, and reactive to light.  Neck:     Trachea: Trachea normal.  Cardiovascular:     Rate and Rhythm: Normal  rate and regular rhythm.     Pulses: Normal pulses.     Heart sounds: Normal heart sounds. No murmur heard.   Pulmonary:     Effort: Pulmonary effort is normal. No respiratory distress.     Breath sounds: Examination of the right-lower field reveals decreased breath sounds. Decreased breath sounds present. No wheezing or rales.  Chest:     Chest wall: No mass, lacerations, deformity, swelling, tenderness, crepitus or edema.  Abdominal:     General: Bowel sounds are decreased. There is no distension.     Palpations: Abdomen is soft.     Tenderness: There is generalized abdominal tenderness. There is no guarding.  Genitourinary:    General: Normal vulva.     Rectum: No mass, anal fissure, external hemorrhoid or internal hemorrhoid.     Comments: Formed, moderately soft stool present in the rectal vault without fecal impaction.  No frank blood or melanotic stool present on gloved following exam. Musculoskeletal:        General: No deformity.     Cervical back: Neck supple. No edema, erythema, signs of trauma, rigidity or crepitus. No pain with movement.     Right lower leg: No edema.     Left lower leg: No edema.     Comments: Contractures of upper and lower extremities, baseline per family  Lymphadenopathy:     Cervical: No cervical adenopathy.  Skin:    General: Skin is warm and dry.  Neurological:     Mental Status: She is alert. Mental status is at baseline.  Psychiatric:        Mood and Affect: Mood normal.    ED Results / Procedures / Treatments   Labs (all labs ordered are listed, but only abnormal results are displayed) Labs Reviewed  CBC WITH DIFFERENTIAL/PLATELET - Abnormal; Notable for the following components:      Result Value   Hemoglobin 11.9 (*)    All other components within normal limits  COMPREHENSIVE METABOLIC PANEL - Abnormal; Notable for the following components:   Creatinine, Ser 0.43 (*)    Alkaline Phosphatase 32 (*)    All other components within  normal limits  LIPASE, BLOOD  HCG, QUANTITATIVE, PREGNANCY  URINALYSIS, ROUTINE W REFLEX MICROSCOPIC    EKG EKG Interpretation  Date/Time:  Saturday April 22 2020 20:33:09 EDT Ventricular Rate:  89 PR Interval:  150 QRS Duration: 97 QT Interval:  354 QTC Calculation: 431 R Axis:   109 Text Interpretation: Sinus rhythm Borderline right axis deviation Low voltage, precordial leads No acute changes No significant change since last tracing Confirmed by Derwood Kaplan ([id number]) on 04/22/2020 9:15:36 PM   Radiology DG Abdomen Acute W/Chest  Result Date: 04/22/2020 CLINICAL DATA:  Evaluate for ileus. EXAM: DG ABDOMEN ACUTE WITH 1 VIEW CHEST COMPARISON:  January 10, 2019 FINDINGS: There is no evidence of dilated bowel loops or free intraperitoneal air. A moderate to marked amount of stool is seen within the distal sigmoid colon. No radiopaque calculi or other significant radiographic abnormality is seen. Heart size and mediastinal contours are within normal limits. Low lung volumes are seen. Both lungs are clear. Radiopaque pedicle screws and Harrington rods are seen throughout the length of the thoracic and lumbar spine. IMPRESSION: 1. Moderate to marked amount of stool within the distal sigmoid colon without evidence of bowel obstruction or ileus. 2. No acute cardiopulmonary disease. Electronically Signed   By: Aram Candela M.D.   On: 04/22/2020 22:12    Procedures Procedures   Medications Ordered in ED Medications  fentaNYL (SUBLIMAZE) injection 25 mcg (25 mcg Intravenous Given 04/22/20 2022)  sodium chloride 0.9 % bolus 1,000 mL (1,000 mLs Intravenous New Bag/Given 04/22/20 2125)    ED Course  I have reviewed the triage vital signs and the nursing notes.  Pertinent labs & imaging results that were available during my care of the patient were reviewed by me and considered in my medical decision making (see chart for details).  Clinical Course as of 04/23/20 0001  Sat Apr 22, 2020   2319 Unable to in-and-out catheterize this patient for collection of UA despite multiple attempts. Will treat presumptively for UTI at this time.  [RS]    Clinical Course User Index [RS] Eretria Manternach, Idelia Salm   MDM Rules/Calculators/A&P                         35 year old female with history of cerebral palsy presents emergency department from facility for concern of abdominal x-ray suggestive of ileus.  Differential diagnosis includes but is not limited to ileus, SBO, constipation, gastroenteritis, COVID-19, toxic megacolon, mesenteric ischemia, abdominal mass, volvulus.  Vital signs are normal on intake.  Cardiopulmonary exam is significant for decreased breath sounds in the right lung base.  Abdomen appears generally tender though soft and nondistended.  No lower extremity edema.  Will proceed with basic laboratory studies, administration of IV pain medication, and acute abdominal series with chest.  CBC with mild anemia, hemoglobin 11.9 patient baseline.  CMP unremarkable, lipase is normal, patient is not pregnant.  UA pending at this time. Acute abdominal series with marked amount of stool within the distal sigmoid colon without evidence of bowel obstruction or ileus.  Acute cardiopulmonary disease. No fecal impaction on rectal exam, there is moderately formed stool present in the rectal vault, though soft. No frank blood or melanotic stool visualized on the left following rectal exam.  Given concerning HPI we will proceed  with CT of the abdomen pelvis at this time.  Unfortunately UA cannot be obtained due to difficulty with catheterization.  Will likely treat presumptively for urinary tract infection in the ED tonight.  Care of this patient signed out to oncoming ED provider, Sharilyn Sites, PA-C at time of shift change.  Patient pending CT scan for disposition at this time.  All pertinent HPI, physical exam, and laboratory findings were discussed with her prior to my departure.  I  appreciate her collaboration in the care of this patient.  This chart was dictated using voice recognition software, Dragon. Despite the best efforts of this provider to proofread and correct errors, errors may still occur which can change documentation meaning.  Final Clinical Impression(s) / ED Diagnoses Final diagnoses:  None    Rx / DC Orders ED Discharge Orders    None       Paris Lore, PA-C 04/23/20 0001    Derwood Kaplan, MD 04/25/20 1540

## 2020-04-23 ENCOUNTER — Encounter (HOSPITAL_COMMUNITY): Payer: Self-pay

## 2020-04-23 ENCOUNTER — Emergency Department (HOSPITAL_COMMUNITY): Payer: Medicare Other

## 2020-04-23 DIAGNOSIS — R1084 Generalized abdominal pain: Secondary | ICD-10-CM | POA: Diagnosis not present

## 2020-04-23 LAB — URINALYSIS, ROUTINE W REFLEX MICROSCOPIC
Bilirubin Urine: NEGATIVE
Glucose, UA: NEGATIVE mg/dL
Hgb urine dipstick: NEGATIVE
Ketones, ur: NEGATIVE mg/dL
Leukocytes,Ua: NEGATIVE
Nitrite: NEGATIVE
Protein, ur: NEGATIVE mg/dL
Specific Gravity, Urine: >1.046 — ABNORMAL HIGH (ref 1.005–1.030)
pH: 8 (ref 5.0–8.0)

## 2020-04-23 MED ORDER — CEPHALEXIN 500 MG PO CAPS: 500.0000 mg | ORAL_CAPSULE | Freq: Three times a day (TID) | ORAL | 0 refills | Status: DC

## 2020-04-23 MED ORDER — IOHEXOL 300 MG/ML  SOLN
80.0000 mL | Freq: Once | INTRAMUSCULAR | Status: AC | PRN
  Administered 2020-04-23: 80 mL via INTRAVENOUS

## 2020-04-23 NOTE — ED Notes (Signed)
In CT contrast was given. IV infiltrated. Another IV placed by ultrasound.

## 2020-04-23 NOTE — ED Notes (Signed)
Unsuccessful in and out cath attempt by a 3rd RN. Dr. Clayborne Dana notified.

## 2020-04-23 NOTE — ED Notes (Signed)
Dr. Clayborne Dana at bedside assessing patients left arm IV that infiltrated.

## 2020-04-23 NOTE — ED Notes (Signed)
[facility] called for transport.  

## 2020-04-23 NOTE — ED Notes (Signed)
Called [facility] and spoke to [place] to give her report on the patient being discharged from the hospital and transported back to the facility.

## 2020-04-23 NOTE — ED Provider Notes (Signed)
Assumed care from PA Sponsellar at shift change.  See prior notes for full H&P.  Briefly, 35 y.o. F with hx of CP, here from SNF with vomiting.  Outside films showed ileus and emesis has smelled like bowel contents.  Has also had large volume diarrhea.  Labs today overall reassuring.  Acute abd series with marked stool.  No fecal impaction noted on DRE by initial team.  Unable to get UA despite multiple attempts, foul odor noted by prior team.  Does have history of UTI's so recommended course of keflex for presumptive UTI if discharged.  Plan:  CT pending for further evaluation.    Results for orders placed or performed during the hospital encounter of 04/22/20  CBC with Differential  Result Value Ref Range   WBC 6.2 4.0 - 10.5 K/uL   RBC 4.49 3.87 - 5.11 MIL/uL   Hemoglobin 11.9 (L) 12.0 - 15.0 g/dL   HCT 62.2 29.7 - 98.9 %   MCV 80.6 80.0 - 100.0 fL   MCH 26.5 26.0 - 34.0 pg   MCHC 32.9 30.0 - 36.0 g/dL   RDW 21.1 94.1 - 74.0 %   Platelets 229 150 - 400 K/uL   nRBC 0.0 0.0 - 0.2 %   Neutrophils Relative % 32 %   Neutro Abs 2.0 1.7 - 7.7 K/uL   Lymphocytes Relative 53 %   Lymphs Abs 3.2 0.7 - 4.0 K/uL   Monocytes Relative 12 %   Monocytes Absolute 0.7 0.1 - 1.0 K/uL   Eosinophils Relative 2 %   Eosinophils Absolute 0.1 0.0 - 0.5 K/uL   Basophils Relative 1 %   Basophils Absolute 0.0 0.0 - 0.1 K/uL   Immature Granulocytes 0 %   Abs Immature Granulocytes 0.01 0.00 - 0.07 K/uL  Comprehensive metabolic panel  Result Value Ref Range   Sodium 138 135 - 145 mmol/L   Potassium 3.7 3.5 - 5.1 mmol/L   Chloride 103 98 - 111 mmol/L   CO2 27 22 - 32 mmol/L   Glucose, Bld 83 70 - 99 mg/dL   BUN 8 6 - 20 mg/dL   Creatinine, Ser 8.14 (L) 0.44 - 1.00 mg/dL   Calcium 8.9 8.9 - 48.1 mg/dL   Total Protein 6.8 6.5 - 8.1 g/dL   Albumin 3.7 3.5 - 5.0 g/dL   AST 19 15 - 41 U/L   ALT 9 0 - 44 U/L   Alkaline Phosphatase 32 (L) 38 - 126 U/L   Total Bilirubin 0.5 0.3 - 1.2 mg/dL   GFR, Estimated  >85 >63 mL/min   Anion gap 8 5 - 15  Lipase, blood  Result Value Ref Range   Lipase 27 11 - 51 U/L  hCG, quantitative, pregnancy  Result Value Ref Range   hCG, Beta Chain, Quant, S <1 <5 mIU/mL   CT Abdomen Pelvis W Contrast  Result Date: 04/23/2020 CLINICAL DATA:  Nausea and vomiting EXAM: CT ABDOMEN AND PELVIS WITH CONTRAST TECHNIQUE: Multidetector CT imaging of the abdomen and pelvis was performed using the standard protocol following bolus administration of intravenous contrast. CONTRAST:  54mL OMNIPAQUE IOHEXOL 300 MG/ML  SOLN COMPARISON:  CT 01/11/2019, radiograph 04/22/2020 FINDINGS: Lower chest: Lung bases demonstrate focal atelectasis left base. No consolidation or effusion. Normal cardiac size. Hepatobiliary: No focal liver abnormality is seen. No gallstones, gallbladder wall thickening, or biliary dilatation. Pancreas: Unremarkable. No pancreatic ductal dilatation or surrounding inflammatory changes. Spleen: Normal in size without focal abnormality. Adrenals/Urinary Tract: Adrenal glands are normal. Kidneys  show no hydronephrosis. The bladder is unremarkable Stomach/Bowel: Stomach is nonenlarged. Gas filled nondilated loops of bowel. Limited assessment secondary to intra-abdominal paucity of fat. No acute bowel wall thickening. Appendix not clearly identified. Vascular/Lymphatic: Nonaneurysmal aorta.  No obvious adenopathy Reproductive: Uterus is unremarkable.  No suspicious adnexal lesion Other: Small free fluid.  No free air Musculoskeletal: Extensive artifact from spinal hardware. Best seen on scout image is subcutaneous/soft tissue emphysema within the left upper arm. There is probable subcutaneous edema within the visualized portions of left upper extremity. Gas extends to the left axilla but is incompletely visualized. IMPRESSION: 1. No CT evidence for acute intra-abdominal or pelvic abnormality. 2. Gas-filled nondistended bowel without obstruction. Small free fluid in the pelvis 3. Soft  tissue emphysema within the left upper extremity, incompletely visualized, raising concern for necrotizing infection. Recommend correlation with direct inspection. 4. Critical Value/emergent results were called by telephone at the time of interpretation on 04/23/2020 at 1:39 am to provider Misty Stanley, who verbally acknowledged these results. Electronically Signed   By: Jasmine Pang M.D.   On: 04/23/2020 01:40   DG Abdomen Acute W/Chest  Result Date: 04/22/2020 CLINICAL DATA:  Evaluate for ileus. EXAM: DG ABDOMEN ACUTE WITH 1 VIEW CHEST COMPARISON:  January 10, 2019 FINDINGS: There is no evidence of dilated bowel loops or free intraperitoneal air. A moderate to marked amount of stool is seen within the distal sigmoid colon. No radiopaque calculi or other significant radiographic abnormality is seen. Heart size and mediastinal contours are within normal limits. Low lung volumes are seen. Both lungs are clear. Radiopaque pedicle screws and Harrington rods are seen throughout the length of the thoracic and lumbar spine. IMPRESSION: 1. Moderate to marked amount of stool within the distal sigmoid colon without evidence of bowel obstruction or ileus. 2. No acute cardiopulmonary disease. Electronically Signed   By: Aram Candela M.D.   On: 04/22/2020 22:12    Notified by radiology concern for gas in LUE on CT.  Arm was inspected immediately after-- she does have IV present in left AC.  There is some mild swelling just proximal to this but no tissue crepitus, overlying erythema, induration, or skin breakdown-- RN was notified that IV contrast infiltrated through this IV during CT.  IV attempted to flush, unable to.  Will remove line and plan to observe.  Dr. Clayborne Dana has also evaluated-- agrees with close observation for now.  3:52 AM Arm re-checked-- swelling actually appears better.  Remains without redness or warmth to touch.  Vitals stable.  Will monitor until 6am.  5:49 AM Arm re-checked once more-- no visible  swelling at present.  Remains without any erythema, induration, warmth to touch, or lymphangitis.  She has no significant pain with palpation of this area.  Her vitals remained stable without development of fever, tachycardia, or hypotension.  As she has been observed for several hours now post CT with continued improvement, low suspicion that this represents necrotizing infection at this time.  We will make facility aware of IV contrast extravasation during CT scan so they can continue daily skin checks.  Course of Keflex getting been as per prior team for presumptive UTI.  Close follow-up with PCP encouraged.  Return here for new concerns.  Discussed with Dr. Clayborne Dana who evaluated patient and agrees with plan of care.   Garlon Hatchet, PA-C 04/23/20 [id number]    Marily Memos, MD 04/23/20 [phone]

## 2020-04-23 NOTE — ED Notes (Signed)
Report received from Felipa Furnace, nightshift RN. Per nightshift RN, pt has been discharged at this time and nightshift RN has called report to pt's facility. Per nightshift RN, pt waiting on transport by [facility]. [facility] stated to nightshift RN there is no definite ETA at this time. Will continue to monitor.

## 2020-04-23 NOTE — Discharge Instructions (Signed)
CT today without findings of ileus or obstruction.   Some of the IV contrast did extravasate into left arm during scan.  We monitored this for several hours with improvement but recommended continued skin checks over the next few days. Lab work was reassuring.  Unable to get urine sample but given history is prescribed course of keflex. Follow-up with primary care doctor. Return here for new concerns.

## 2020-04-23 NOTE — ED Notes (Signed)
[facility] here to transport pt ?

## 2020-04-23 NOTE — ED Notes (Signed)
Called patients caregiver, Selinda Flavin, updated her on patients disposition back to facility.

## 2020-04-23 NOTE — ED Notes (Signed)
Pt awake and alert to voice, however non-verbal at a baseline. Pt attached to cardiac monitor x3. VSS at this time. Pt with pure wick in place and dry brief and linens at this time.

## 2020-12-11 ENCOUNTER — Other Ambulatory Visit: Payer: Self-pay

## 2020-12-11 ENCOUNTER — Encounter (HOSPITAL_COMMUNITY): Payer: Self-pay

## 2020-12-11 ENCOUNTER — Emergency Department (HOSPITAL_COMMUNITY): Payer: Medicare Other

## 2020-12-11 ENCOUNTER — Emergency Department (HOSPITAL_COMMUNITY)
Admission: EM | Admit: 2020-12-11 | Discharge: 2020-12-11 | Disposition: A | Discharge status: Home / Self Care | Payer: Medicare Other | Attending: Emergency Medicine | Admitting: Emergency Medicine

## 2020-12-11 DIAGNOSIS — R4182 Altered mental status, unspecified: Secondary | ICD-10-CM | POA: Diagnosis not present

## 2020-12-11 DIAGNOSIS — Z20822 Contact with and (suspected) exposure to covid-19: Secondary | ICD-10-CM | POA: Insufficient documentation

## 2020-12-11 DIAGNOSIS — R059 Cough, unspecified: Secondary | ICD-10-CM | POA: Insufficient documentation

## 2020-12-11 LAB — URINALYSIS, ROUTINE W REFLEX MICROSCOPIC
Bilirubin Urine: NEGATIVE
Glucose, UA: NEGATIVE mg/dL
Hgb urine dipstick: NEGATIVE
Ketones, ur: NEGATIVE mg/dL
Leukocytes,Ua: NEGATIVE
Nitrite: NEGATIVE
Protein, ur: NEGATIVE mg/dL
Specific Gravity, Urine: 1.018 (ref 1.005–1.030)
pH: 6 (ref 5.0–8.0)

## 2020-12-11 LAB — CBC
HCT: 35.2 % — ABNORMAL LOW (ref 36.0–46.0)
Hemoglobin: 11.6 g/dL — ABNORMAL LOW (ref 12.0–15.0)
MCH: 26.4 pg (ref 26.0–34.0)
MCHC: 33 g/dL (ref 30.0–36.0)
MCV: 80.2 fL (ref 80.0–100.0)
Platelets: 210 10*3/uL (ref 150–400)
RBC: 4.39 MIL/uL (ref 3.87–5.11)
RDW: 13.2 % (ref 11.5–15.5)
WBC: 4 10*3/uL (ref 4.0–10.5)
nRBC: 0 % (ref 0.0–0.2)

## 2020-12-11 LAB — COMPREHENSIVE METABOLIC PANEL
ALT: 15 U/L (ref 0–44)
AST: 26 U/L (ref 15–41)
Albumin: 3.6 g/dL (ref 3.5–5.0)
Alkaline Phosphatase: 39 U/L (ref 38–126)
Anion gap: 7 (ref 5–15)
BUN: 7 mg/dL (ref 6–20)
CO2: 28 mmol/L (ref 22–32)
Calcium: 8.9 mg/dL (ref 8.9–10.3)
Chloride: 101 mmol/L (ref 98–111)
Creatinine, Ser: 0.46 mg/dL (ref 0.44–1.00)
GFR, Estimated: >60 mL/min (ref >60)
Glucose, Bld: 93 mg/dL (ref 70–99)
Potassium: 3.7 mmol/L (ref 3.5–5.1)
Sodium: 136 mmol/L (ref 135–145)
Total Bilirubin: 0.3 mg/dL (ref 0.3–1.2)
Total Protein: 6.7 g/dL (ref 6.5–8.1)

## 2020-12-11 LAB — RESP PANEL BY RT-PCR (FLU A&B, COVID) ARPGX2
Influenza A by PCR: NEGATIVE
Influenza B by PCR: NEGATIVE
SARS Coronavirus 2 by RT PCR: NEGATIVE

## 2020-12-11 LAB — LACTIC ACID, PLASMA: Lactic Acid, Venous: 0.6 mmol/L (ref 0.5–1.9)

## 2020-12-11 MED ORDER — SODIUM CHLORIDE 0.9 % IV BOLUS
500.0000 mL | Freq: Once | INTRAVENOUS | Status: AC
  Administered 2020-12-11: 14:00:00 500 mL via INTRAVENOUS

## 2020-12-11 NOTE — ED Provider Notes (Signed)
[facility]-EMERGENCY DEPT Provider Note   CSN: [id number] Arrival date & time: 12/11/20  1255     History Chief Complaint  Patient presents with   Altered Mental Status   Cough    Sheila Costa is a 35 y.o. female.  HPI Level 5 caveat secondary to nonverbal History from caregiver from group home-[place] 44 female ho cerebral palsy presents today with report of decreased responsiveness.  Patient nonverbal at baseline, does not sit up on own, requires full care.  However, normally smiles and makes happy noises and claps.  She has not been her usual self since Saturday- ok prior to bed.  Yesterday moaning, not smiling. Today coughing and not eating and drinking as well as usual and did not want to take meds. Covid contact due to staff Vaccines up to date- unclear if she had booster Flu status unknown     Past Medical History:  Diagnosis Date   Cerebral palsy (HCC)    Constipation    Dysphagia    Dysphagia    Encephalopathy    Failure to thrive (0-17)    Mental retardation    Osteopenia    Osteoporosis    Pressure ulcer 2008   right lower extremity   Scoliosis    Seizure disorder (HCC)    Seizures (HCC)    Spastic quadriparesis (HCC)    Static encephalopathy     Patient Active Problem List   Diagnosis Date Noted   Encounter for nasogastric (NG) tube placement    Mental retardation 01/20/2014   Constipated 01/20/2014   Enteritis 01/17/2014   Possible UTI (lower urinary tract infection) 01/17/2014   UTI (lower urinary tract infection) 03/18/2011   Hypernatremia 03/18/2011   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 No pertinent family history.  Social History   Tobacco Use   Smoking status: Never   Smokeless tobacco: Never  Substance Use Topics   Alcohol use: No   Drug use: No    Home Medications Prior to Admission medications   Medication Sig Start Date End Date Taking? Authorizing Provider  baclofen (LIORESAL) 10 MG tablet Take 10-20 mg by mouth See admin instructions. 10 mg in the morning, then 20 mg twice daily at 1200 and 2000   Yes [provider]  benzoyl peroxide 10 % gel Apply 1 application topically every evening.   Yes [provider]  Carbamazepine (EQUETRO) 300 MG CP12 Take 600-900 mg by mouth See admin instructions. 600 mg every morning and 900 mg every night   Yes [provider]  chlorhexidine (PERIDEX) 0.12 % solution Use as directed 10 mLs in the mouth or throat  at bedtime.    Yes [provider]  cholecalciferol (VITAMIN D) 1000 UNITS tablet Take 2,000 Units by mouth daily.   Yes [provider]  diphenhydrAMINE (BENADRYL) 25 MG tablet Take 25 mg by mouth at bedtime.   Yes [provider]  levOCARNitine (CARNITOR) 1 GM/10ML solution Take 500 mg by mouth at bedtime. 04/21/20  Yes [provider]  magnesium oxide (MAG-OX) 400 (241.3 Mg) MG tablet Take 400 mg by mouth 3 (three) times daily.   Yes [provider]  Multiple Vitamins-Minerals (MULTIVITAMIN WITH MINERALS) tablet Take 1 tablet by mouth daily.   Yes [provider]  Nutritional Supplements (ENSURE ENLIVE PO) Take 237 mLs by mouth See admin instructions. With lunch on Monday's, Wednesday's, and Sunday's   Yes [provider]  ondansetron (ZOFRAN-ODT) 4 MG disintegrating tablet Take 4 mg by mouth  every 8 (eight) hours as needed for nausea. 04/21/20  Yes [provider]  polyethylene glycol (MIRALAX / GLYCOLAX) packet Take 17 g by mouth daily. Mix with 4 ounces of prune juice.   Yes [provider]  promethazine (PHENERGAN) 25 MG suppository Place 25 mg rectally See admin instructions. 25 mg every 4 to 6 hours as needed for vomiting 2 or more times in 4 hours 02/16/16  Yes [provider]  promethazine (PHENERGAN) 25 MG tablet Take 25 mg by mouth See admin instructions. 25 mg every 4 to 6 hours as needed for vomiting 2 or more times in 4 hours 01/27/20  Yes [provider]  scopolamine (TRANSDERM-SCOP) 1.5 MG Place 1 patch onto the skin every 3 (three) days. Behind ear.   Yes [provider]  senna (SENOKOT) 8.6 MG TABS Take 1 tablet by mouth 2 (two) times daily.    Yes [provider]  triazolam (HALCION) 0.25 MG tablet Take 0.25 mg by mouth See admin instructions. 0.25 mg prior to dental procedure as directed by the [facility] 01/11/20  Yes [provider]  TRULANCE 3 MG TABS Take 3 mg by mouth daily. 04/07/20  Yes [provider]  valproic acid (DEPAKENE) 250 MG/5ML solution Take 500 mg by mouth 3 (three) times daily.   Yes [provider]  dicyclomine (BENTYL) 20 MG tablet Take 1 tablet (20 mg total) by mouth 2 (two) times daily as needed for spasms. Patient not taking: No sig reported 02/22/16   Loren Racer, MD    Allergies    Patient has no known allergies.  Review of Systems   Review of Systems  Physical Exam Updated Vital Signs BP 110/76   Pulse 90   Temp (!) 96.4 F (35.8 C) (Rectal)   Resp 16   Ht 1.626 m (5\' 4" )   Wt 38.6 kg   SpO2 100%   BMI 14.61 kg/m   Physical Exam Vitals and nursing note reviewed.  Constitutional:      Appearance: Normal appearance.  HENT:     Head: Normocephalic.     Right Ear: External ear normal.     Left Ear: External ear normal.     Nose:  Nose normal.     Mouth/Throat:     Mouth: Mucous membranes are dry.     Pharynx: Oropharynx is clear.  Eyes:     Extraocular Movements: Extraocular movements intact.  Cardiovascular:     Rate and Rhythm: Normal rate and regular rhythm.     Pulses: Normal pulses.  Pulmonary:     Effort: Pulmonary effort is  normal.  Abdominal:     General: Abdomen is flat.     Palpations: Abdomen is soft.  Musculoskeletal:        General: No tenderness or signs of injury.     Cervical back: Normal range of motion.     Comments: Bilateral upper and lower extremities somewhat contractured -no signs of trauma, or infection.   Skin:    General: Skin is warm.     Capillary Refill: Capillary refill takes less than 2 seconds.  Neurological:     General: No focal deficit present.     Mental Status: She is alert.     Comments: Patient at baseline with eyes open and nonverbal Does not follow commands    ED Results / Procedures / Treatments   Labs (all labs ordered are listed, but only abnormal results are displayed) Labs Reviewed  CBC - Abnormal; Notable for the following components:      Result Value   Hemoglobin 11.6 (*)    HCT 35.2 (*)    All other components within normal limits  RESP PANEL BY RT-PCR (FLU A&B, COVID) ARPGX2  CULTURE, BLOOD (ROUTINE X 2)  CULTURE, BLOOD (ROUTINE X 2)  COMPREHENSIVE METABOLIC PANEL  LACTIC ACID, PLASMA  URINALYSIS, ROUTINE W REFLEX MICROSCOPIC  LACTIC ACID, PLASMA    EKG None  Radiology DG Chest Port 1 View  Result Date: 12/11/2020 CLINICAL DATA:  Cough.  Mental status changes. EXAM: PORTABLE CHEST 1 VIEW COMPARISON:  01/10/2019 FINDINGS: Lung apices are obscured by positioning of the head. Allowing for that, the lungs appear clear. Chronic spinal fusion as seen previously. IMPRESSION: No active disease identified. Lung apices are poorly seen because of overlapping of the head. Electronically Signed   By: Paulina Fusi M.D.   On: 12/11/2020 13:52     Procedures Procedures   Medications Ordered in ED Medications  sodium chloride 0.9 % bolus 500 mL (0 mLs Intravenous Stopped 12/11/20 1533)    ED Course  I have reviewed the triage vital signs and the nursing notes.  Pertinent labs & imaging results that were available during my care of the patient were reviewed by me and considered in my medical decision making (see chart for details).    MDM Rules/Calculators/A&P                         Patient from group home with reports that she is coughing and not acting as usual.  At baseline patient is nonverbal but does take food when fed.  Here she is awake and appears to be at baseline.  She is afebrile.  Vital signs are normal. Awaiting urinalysis Likely discharge Discussed with Dr. Renaye Rakers who has assumed care and will dispo after u/a returns. Final Clinical Impression(s) / ED Diagnoses Final diagnoses:  Cough, unspecified type    Rx / DC Orders ED Discharge Orders     None        Margarita Grizzle, MD 12/11/20 [id number]

## 2020-12-11 NOTE — Discharge Instructions (Addendum)
Sheila Costa's medical workup did not show obvious cause for her decreased responsiveness today.  Specifically, we did not see signs of infection, anemia, dehydration.  We did not see signs of a urine infection or COVID or influenza on her testing.  We did not see signs of pneumonia on her x-ray or on her blood test.  At this time she is not needing medical admission to the hospital.  However it still important that she would follow-up with her primary care doctor for this visit and for her symptoms.

## 2020-12-11 NOTE — ED Triage Notes (Signed)
EMS reports from [facility] group home staff states Pt crying and altered from baseline since this morning, caregiver states she has not been acting herself and has cough. Pt non-verbal.   BP 134/88 HR 98 RR 20 Sp02 98 RA CBG 131

## 2020-12-13 LAB — BLOOD CULTURE ID PANEL (REFLEXED) - BCID2

## 2020-12-14 LAB — CULTURE, BLOOD (ROUTINE X 2): Special Requests: ADEQUATE

## 2020-12-15 ENCOUNTER — Emergency Department (HOSPITAL_COMMUNITY): Payer: Medicare Other

## 2020-12-15 ENCOUNTER — Emergency Department (HOSPITAL_BASED_OUTPATIENT_CLINIC_OR_DEPARTMENT_OTHER): Payer: Medicare Other | Admitting: Radiology

## 2020-12-15 ENCOUNTER — Other Ambulatory Visit: Payer: Self-pay

## 2020-12-15 ENCOUNTER — Telehealth: Payer: Self-pay

## 2020-12-15 ENCOUNTER — Emergency Department (HOSPITAL_BASED_OUTPATIENT_CLINIC_OR_DEPARTMENT_OTHER)
Admission: EM | Admit: 2020-12-15 | Discharge: 2020-12-15 | Disposition: A | Discharge status: Home / Self Care | Payer: Medicare Other | Attending: Emergency Medicine | Admitting: Emergency Medicine

## 2020-12-15 ENCOUNTER — Encounter (HOSPITAL_BASED_OUTPATIENT_CLINIC_OR_DEPARTMENT_OTHER): Payer: Self-pay | Admitting: Emergency Medicine

## 2020-12-15 DIAGNOSIS — J069 Acute upper respiratory infection, unspecified: Secondary | ICD-10-CM | POA: Diagnosis not present

## 2020-12-15 DIAGNOSIS — R051 Acute cough: Secondary | ICD-10-CM

## 2020-12-15 DIAGNOSIS — Z20822 Contact with and (suspected) exposure to covid-19: Secondary | ICD-10-CM | POA: Diagnosis not present

## 2020-12-15 DIAGNOSIS — R059 Cough, unspecified: Secondary | ICD-10-CM | POA: Diagnosis present

## 2020-12-15 LAB — LACTIC ACID, PLASMA: Lactic Acid, Venous: 1 mmol/L (ref 0.5–1.9)

## 2020-12-15 LAB — CBC WITH DIFFERENTIAL/PLATELET
Abs Immature Granulocytes: 0.02 10*3/uL (ref 0.00–0.07)
Basophils Absolute: 0 10*3/uL (ref 0.0–0.1)
Basophils Relative: 1 %
Eosinophils Absolute: 0.1 10*3/uL (ref 0.0–0.5)
Eosinophils Relative: 1 %
HCT: 41.9 % (ref 36.0–46.0)
Hemoglobin: 13.9 g/dL (ref 12.0–15.0)
Immature Granulocytes: 0 %
Lymphocytes Relative: 53 %
Lymphs Abs: 2.9 10*3/uL (ref 0.7–4.0)
MCH: 26 pg (ref 26.0–34.0)
MCHC: 33.2 g/dL (ref 30.0–36.0)
MCV: 78.3 fL — ABNORMAL LOW (ref 80.0–100.0)
Monocytes Absolute: 0.7 10*3/uL (ref 0.1–1.0)
Monocytes Relative: 13 %
Neutro Abs: 1.8 10*3/uL (ref 1.7–7.7)
Neutrophils Relative %: 32 %
Platelets: 288 10*3/uL (ref 150–400)
RBC: 5.35 MIL/uL — ABNORMAL HIGH (ref 3.87–5.11)
RDW: 13.1 % (ref 11.5–15.5)
WBC: 5.5 10*3/uL (ref 4.0–10.5)
nRBC: 0 % (ref 0.0–0.2)

## 2020-12-15 LAB — RESP PANEL BY RT-PCR (FLU A&B, COVID) ARPGX2
Influenza A by PCR: NEGATIVE
Influenza B by PCR: NEGATIVE
SARS Coronavirus 2 by RT PCR: NEGATIVE

## 2020-12-15 LAB — BASIC METABOLIC PANEL
Anion gap: 9 (ref 5–15)
BUN: 8 mg/dL (ref 6–20)
CO2: 28 mmol/L (ref 22–32)
Calcium: 9.6 mg/dL (ref 8.9–10.3)
Chloride: 102 mmol/L (ref 98–111)
Creatinine, Ser: 0.42 mg/dL — ABNORMAL LOW (ref 0.44–1.00)
GFR, Estimated: >60 mL/min (ref >60)
Glucose, Bld: 83 mg/dL (ref 70–99)
Potassium: 4.3 mmol/L (ref 3.5–5.1)
Sodium: 139 mmol/L (ref 135–145)

## 2020-12-15 LAB — GROUP A STREP BY PCR: Group A Strep by PCR: NOT DETECTED

## 2020-12-15 MED ORDER — SODIUM CHLORIDE 0.9 % IV SOLN
1.0000 g | Freq: Once | INTRAVENOUS | Status: AC
  Administered 2020-12-15: 1 g via INTRAVENOUS
  Filled 2020-12-15: qty 10

## 2020-12-15 MED ORDER — AZITHROMYCIN 200 MG/5ML PO SUSR: 250.0000 mg | Freq: Every day | ORAL | 0 refills | Status: DC

## 2020-12-15 MED ORDER — AZITHROMYCIN 200 MG/5ML PO SUSR: 250.0000 mg | Freq: Every day | ORAL | 0 refills | Status: AC

## 2020-12-15 NOTE — Discharge Instructions (Addendum)
Please pick up antibiotics from pharmacy.   Please take all of your antibiotics until finished!   You may develop abdominal discomfort or diarrhea from the antibiotic.  You may help offset this with probiotics which you can buy or get in yogurt. Do not eat  or take the probiotics until 2 hours after your antibiotic.

## 2020-12-15 NOTE — ED Notes (Addendum)
Pt's caregiver (Genise) verbalizes understanding of discharge instructions. Opportunity for questioning and answers were provided. Pt discharged from ED with caregiver to return to facility.

## 2020-12-15 NOTE — ED Notes (Signed)
Pt gone for chest xray

## 2020-12-15 NOTE — ED Provider Notes (Signed)
[facility] EMERGENCY DEPT Provider Note   CSN: [id number] Arrival date & time: 12/15/20  1707     History Chief Complaint  Patient presents with   URI  History difficult to obtain due to level 5 caveat status.  Sheila Costa is a 35 y.o. female. This is a patient with a complex medical history including cerebral palsy, encephalopathy, quadriparesis, scoliosis, failure to thrive, and MR.  She presents to the emergency department with a cough.  She lives in a group home and is here with a caregiver who does not know much about her history.  Apparently patient is also been less responsive and more sleepy over the past week or so.  She has had this cough that sounds very gurgly.  She has had decreased p.o. intake.  She was apparently seen in the emergency department on 11-28 for similar symptoms.  At that time, she had a mostly negative work-up.  She did have 1 blood culture out of 2 turn positive for streptococci, however it was not both blood culture tubes.  Apparently patient has not improved at all since she was last seen.    URI     Past Medical History:  Diagnosis Date   Cerebral palsy (HCC)    Constipation    Dysphagia    Dysphagia    Encephalopathy    Failure to thrive (0-17)    Mental retardation    Osteopenia    Osteoporosis    Pressure ulcer 2008   right lower extremity   Scoliosis    Seizure disorder (HCC)    Seizures (HCC)    Spastic quadriparesis (HCC)    Static encephalopathy     Patient Active Problem List   Diagnosis Date Noted   Encounter for nasogastric (NG) tube placement    Mental retardation 01/20/2014   Constipated 01/20/2014   Enteritis 01/17/2014   Possible UTI (lower urinary tract infection) 01/17/2014   UTI (lower urinary tract infection) 03/18/2011   Hypernatremia 03/18/2011   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 No pertinent family history.  Social History   Tobacco Use   Smoking status: Never   Smokeless tobacco: Never  Substance Use Topics   Alcohol use: No   Drug use: No    Home Medications Prior to Admission medications   Medication Sig Start Date End Date Taking? Authorizing Provider  azithromycin (ZITHROMAX) 200 MG/5ML suspension Take 6.3 mLs (250 mg total) by mouth daily for 7 days. 12/15/20 12/22/20  Saajan Willmon, Finis Bud, PA-C  baclofen (LIORESAL) 10 MG tablet Take 10-20 mg by mouth See admin instructions. 10 mg in the morning, then 20 mg twice daily at  1200 and 2000    [provider]  benzoyl peroxide 10 % gel Apply 1 application topically every evening.    [provider]  Carbamazepine (EQUETRO) 300 MG CP12 Take 600-900 mg by mouth See admin instructions. 600 mg every morning and 900 mg every night    [provider]  chlorhexidine (PERIDEX) 0.12 % solution Use as directed 10 mLs in the mouth or throat at bedtime.     [provider]  cholecalciferol (VITAMIN D) 1000 UNITS tablet Take 2,000 Units by mouth daily.    [provider]  dicyclomine (BENTYL) 20 MG tablet Take 1 tablet (20 mg total) by mouth 2 (two) times daily as needed for spasms. Patient not taking: No sig reported 02/22/16   Loren Racer, MD  diphenhydrAMINE (BENADRYL) 25 MG tablet Take 25 mg by mouth at bedtime.    [provider]  levOCARNitine (CARNITOR) 1 GM/10ML solution Take 500 mg by mouth at bedtime.  04/21/20   [provider]  magnesium oxide (MAG-OX) 400 (241.3 Mg) MG tablet Take 400 mg by mouth 3 (three) times daily.    [provider]  Multiple Vitamins-Minerals (MULTIVITAMIN WITH MINERALS) tablet Take 1 tablet by mouth daily.    [provider]  Nutritional Supplements (ENSURE ENLIVE PO) Take 237 mLs by mouth See admin instructions. With lunch on Monday's, Wednesday's, and Sunday's    [provider]  ondansetron (ZOFRAN-ODT) 4 MG disintegrating tablet Take 4 mg by mouth every 8 (eight) hours as needed for nausea. 04/21/20   [provider]  polyethylene glycol (MIRALAX / GLYCOLAX) packet Take 17 g by mouth daily. Mix with 4 ounces of prune juice.    [provider]  promethazine (PHENERGAN) 25 MG suppository Place 25 mg rectally See admin instructions. 25 mg every 4 to 6 hours as needed for vomiting 2 or more times in 4 hours 02/16/16   [provider]  promethazine (PHENERGAN) 25 MG tablet Take 25 mg by mouth See admin instructions. 25 mg every 4 to 6 hours as needed for vomiting 2 or more times in 4 hours 01/27/20   [provider]  scopolamine (TRANSDERM-SCOP) 1.5 MG Place 1 patch onto the skin every 3 (three) days. Behind ear.    [provider]  senna (SENOKOT) 8.6 MG TABS Take 1 tablet by mouth 2 (two) times daily.     [provider]  triazolam (HALCION) 0.25 MG tablet Take 0.25 mg by mouth See admin instructions. 0.25 mg prior to dental procedure as directed by the [facility] 01/11/20   [provider]  TRULANCE 3 MG TABS Take 3 mg by mouth daily. 04/07/20   [provider]  valproic acid (DEPAKENE) 250 MG/5ML solution Take 500 mg by mouth 3 (three) times daily.    [provider]    Allergies    Patient has no known allergies.  Review of Systems   Review of Systems  Unable to perform ROS: Patient nonverbal   Physical Exam Updated Vital Signs BP 122/82    Pulse 79   Temp 97.6 F (36.4 C) (Temporal)   Resp 18   SpO2 98%   Physical Exam Vitals and nursing note reviewed.  Constitutional:      General: She is not in acute distress.    Appearance: Normal appearance. She is well-developed. She is not ill-appearing, toxic-appearing or diaphoretic.  HENT:     Head: Normocephalic and atraumatic.     Right  Ear: Tympanic membrane, ear canal and external ear normal. There is no impacted cerumen.     Left Ear: Tympanic membrane, ear canal and external ear normal. There is no impacted cerumen.     Nose: Nose normal. No nasal deformity.     Mouth/Throat:     Lips: Pink. No lesions.     Mouth: Mucous membranes are moist.     Pharynx: Oropharynx is clear. No oropharyngeal exudate or posterior oropharyngeal erythema.  Eyes:     General: Gaze aligned appropriately. No scleral icterus.       Right eye: No discharge.        Left eye: No discharge.     Conjunctiva/sclera: Conjunctivae normal.     Right eye: Right conjunctiva is not injected. No exudate or hemorrhage.    Left eye: Left conjunctiva is not injected. No exudate or hemorrhage. Cardiovascular:     Rate and Rhythm: Normal rate and regular rhythm.     Pulses: Normal pulses.     Heart sounds: Normal heart sounds. No murmur heard.   No friction rub. No gallop.  Pulmonary:     Effort: Pulmonary effort is normal. No respiratory distress.     Breath sounds: No stridor. Rhonchi present. No wheezing or rales.  Chest:     Chest wall: No tenderness.  Abdominal:     General: Abdomen is flat. There is no distension.     Palpations: Abdomen is soft.     Tenderness: There is no guarding or rebound.  Musculoskeletal:     Cervical back: Neck supple.  Skin:    General: Skin is warm and dry.     Coloration: Skin is not jaundiced or pale.     Findings: No bruising, erythema, lesion or rash.  Neurological:     Mental Status: She is alert. Mental status is at baseline.  Psychiatric:        Mood and  Affect: Mood normal.        Speech: Speech normal.        Behavior: Behavior normal. Behavior is cooperative.    ED Results / Procedures / Treatments   Labs (all labs ordered are listed, but only abnormal results are displayed) Labs Reviewed  BASIC METABOLIC PANEL - Abnormal; Notable for the following components:      Result Value   Creatinine, Ser 0.42 (*)    All other components within normal limits  CBC WITH DIFFERENTIAL/PLATELET - Abnormal; Notable for the following components:   RBC 5.35 (*)    MCV 78.3 (*)    All other components within normal limits  RESP PANEL BY RT-PCR (FLU A&B, COVID) ARPGX2  GROUP A STREP BY PCR  CULTURE, BLOOD (ROUTINE X 2)  CULTURE, BLOOD (ROUTINE X 2)  LACTIC ACID, PLASMA    EKG None  Radiology DG Chest 1 View  Result Date: 12/15/2020 CLINICAL DATA:  Upper respiratory infection EXAM: PORTABLE CHEST 1 VIEW COMPARISON:  12/11/2020 FINDINGS: Cardiac shadow is stable. Postsurgical changes are noted. Lungs are hypoinflated but clear. No acute bony abnormality is noted. IMPRESSION: Poor inspiratory effort without focal confluent infiltrate. Electronically Signed   By: Alcide Clever M.D.   On: 12/15/2020 19:57    Procedures Procedures   Medications Ordered in ED Medications  cefTRIAXone (ROCEPHIN) 1 g in sodium chloride 0.9 % 100 mL IVPB (0 g Intravenous Stopped 12/15/20 2201)    ED Course  I have reviewed the triage vital signs and the nursing notes.  Pertinent labs &  imaging results that were available during my care of the patient were reviewed by me and considered in my medical decision making (see chart for details).    MDM Rules/Calculators/A&P                         This is a nonverbal chronically disabled patient who presents to the emergency department with continued cough and a positive blood culture that came up a couple of days ago. She is afebrile.  Vitals are stable. Work-up with no leukocytosis.  CBC otherwise normal.  Her BMP is  unremarkable.  Strep test negative.  Respiratory panel negative.  Lactic acid negative.  We are able to get a 1 view chest x-ray which was not notable for pneumonia, however two-view would be better to evaluate for this.  Given a dose of Rocephin here in the ED for positive blood culture. Following work-up, I do not feel that patient is septic.  I think the positive blood culture was likely a false positive given that it was only one of the 2 tubes.  Will discharge home on azithromycin to cover for pneumonia given that we do not get a thorough evaluation with a chest x-ray that we obtained and patient does sound rhonchorous.  Final Clinical Impression(s) / ED Diagnoses Final diagnoses:  Acute cough    Rx / DC Orders ED Discharge Orders          Ordered    azithromycin (ZITHROMAX) 200 MG/5ML suspension  Daily,   Status:  Discontinued        12/15/20 2213    azithromycin (ZITHROMAX) 200 MG/5ML suspension  Daily,   Status:  Discontinued        12/15/20 2215    azithromycin (ZITHROMAX) 200 MG/5ML suspension  Daily        12/15/20 2216             Therese Sarah 12/15/20 2255    Ernie Avena, MD 12/16/20 1931

## 2020-12-15 NOTE — Telephone Encounter (Signed)
Post ED Visit - Positive Culture Follow-up: Unsuccessful Patient Follow-up  Culture assessed and recommendations reviewed by:  []  , Pharm.D. []  Enzo Bi, Pharm.D., BCPS AQ-ID []  , Pharm.D., BCPS []  Celedonio Miyamoto, Pharm.D., BCPS []  [place], Garvin Fila.D., BCPS, AAHIVP []  , Pharm.D., BCPS, AAHIVP []  Georgina Pillion, PharmD []  , PharmD, BCPS  Positive blood culture  [x]  Patient discharged without antimicrobial prescription and treatment is now indicated []  Organism is resistant to prescribed ED discharge antimicrobial []  Patient with positive blood cultures   Unable to contact caregivers at [facility] after 3 attempts, letter will be sent to address on file  [address] 12/15/2020, 1:19 PM

## 2020-12-15 NOTE — ED Triage Notes (Signed)
Pt presents to ED POV from [facility]. Per group home worker pt not being feeling well x1w. Pt coughing and increased mucus.

## 2020-12-15 NOTE — ED Notes (Addendum)
Female with complex medical history and anatomy, including encephalopathy, cerebral palsy, scoliosis, quadriparesis, sz, FTT, and MR. Sitting in personal w/c. Here from group home with staff member. Here for URI and sore throat. Mentions strep +. Unable to visualize throat. LS CTA with upper airway noises. Alert, NAD, calm. Abd soft NT.

## 2020-12-16 LAB — CULTURE, BLOOD (ROUTINE X 2)
Culture: NO GROWTH
Special Requests: ADEQUATE

## 2020-12-18 NOTE — Progress Notes (Signed)
ED Antimicrobial Stewardship Positive Culture Follow Up   Sheila Costa is an 35 y.o. female who presented to [facility] on 12/11/2020 with a chief complaint of  Chief Complaint  Patient presents with   Altered Mental Status   Cough    Recent Results (from the past 720 hour(s))  Resp Panel by RT-PCR (Flu A&B, Covid) Nasopharyngeal Swab     Status: None   Collection Time: 12/11/20  2:08 PM   Specimen: Nasopharyngeal Swab; Nasopharyngeal(NP) swabs in vial transport medium  Result Value Ref Range Status   SARS Coronavirus 2 by RT PCR NEGATIVE NEGATIVE Final    Comment: (NOTE) SARS-CoV-2 target nucleic acids are NOT DETECTED.  The SARS-CoV-2 RNA is generally detectable in upper respiratory specimens during the acute phase of infection. The lowest concentration of SARS-CoV-2 viral copies this assay can detect is 138 copies/mL. A negative result does not preclude SARS-Cov-2 infection and should not be used as the sole basis for treatment or other patient management decisions. A negative result may occur with  improper specimen collection/handling, submission of specimen other than nasopharyngeal swab, presence of viral mutation(s) within the areas targeted by this assay, and inadequate number of viral copies(<138 copies/mL). A negative result must be combined with clinical observations, patient history, and epidemiological information. The expected result is Negative.  Fact Sheet for Patients:  EntrepreneurPulse.com.au  Fact Sheet for Healthcare Providers:  IncredibleEmployment.be  This test is no t yet approved or cleared by the [place] FDA and  has been authorized for detection and/or diagnosis of SARS-CoV-2 by FDA under an Emergency Use Authorization (EUA). This EUA will remain  in effect (meaning this test can be used) for the duration of the COVID-19 declaration under Section 564(b)(1) of the Act, 21 U.S.C.section 360bbb-3(b)(1),  unless the authorization is terminated  or revoked sooner.       Influenza A by PCR NEGATIVE NEGATIVE Final   Influenza B by PCR NEGATIVE NEGATIVE Final    Comment: (NOTE) The Xpert Xpress SARS-CoV-2/FLU/RSV plus assay is intended as an aid in the diagnosis of influenza from Nasopharyngeal swab specimens and should not be used as a sole basis for treatment. Nasal washings and aspirates are unacceptable for Xpert Xpress SARS-CoV-2/FLU/RSV testing.  Fact Sheet for Patients: EntrepreneurPulse.com.au  Fact Sheet for Healthcare Providers: IncredibleEmployment.be  This test is not yet approved or cleared by the [place] FDA and has been authorized for detection and/or diagnosis of SARS-CoV-2 by FDA under an Emergency Use Authorization (EUA). This EUA will remain in effect (meaning this test can be used) for the duration of the COVID-19 declaration under Section 564(b)(1) of the Act, 21 U.S.C. section 360bbb-3(b)(1), unless the authorization is terminated or revoked.  Performed at [facility] [place] [facility], [place] [address] [place], [place] [address]   Blood culture (routine x 2)     Status: None   Collection Time: 12/11/20  3:28 PM   Specimen: BLOOD  Result Value Ref Range Status   Specimen Description   Final    BLOOD RIGHT ANTECUBITAL Performed at [facility] [address] [place], [place] [address]    Special Requests   Final    BOTTLES DRAWN AEROBIC AND ANAEROBIC Blood Culture adequate volume Performed at [facility] [address] [place], [place] [address]    Culture   Final    NO GROWTH 5 DAYS Performed at [facility], [place] [address] [place], [place] [address]    Report  Status 12/16/2020 FINAL  Final  Blood culture (routine x 2)     Status: Abnormal   Collection Time: 12/11/20  4:08 PM   Specimen: BLOOD  Result Value Ref Range Status   Specimen Description   Final     BLOOD RIGHT ANTECUBITAL Performed at [facility], [address], [place], [place] [address]    Special Requests   Final    BOTTLES DRAWN AEROBIC AND ANAEROBIC Blood Culture adequate volume Performed at [facility], [address], [place], [place] [address]    Culture  Setup Time   Final    GRAM POSITIVE COCCI IN CHAINS AEROBIC BOTTLE ONLY CRITICAL RESULT CALLED TO, READ BACK BY AND VERIFIED WITH: RN Gracelyn Nurse [id number] FCP Performed at [facility], [address], [place], [place] [address]    Culture STREPTOCOCCUS MITIS/ORALIS (A)  Final   Report Status 12/14/2020 FINAL  Final   Organism ID, Bacteria STREPTOCOCCUS MITIS/ORALIS  Final      Susceptibility   Streptococcus mitis/oralis - MIC*    TETRACYCLINE >=16 RESISTANT Resistant     VANCOMYCIN 0.5 SENSITIVE Sensitive     CLINDAMYCIN >=1 RESISTANT Resistant     PENICILLIN Value in next row Sensitive      SENSITIVE0.06    CEFTRIAXONE Value in next row Sensitive      SENSITIVE0.12    * STREPTOCOCCUS MITIS/ORALIS  Blood Culture ID Panel (Reflexed)     Status: Abnormal   Collection Time: 12/11/20  4:08 PM  Result Value Ref Range Status   Enterococcus faecalis NOT DETECTED NOT DETECTED Final   Enterococcus Faecium NOT DETECTED NOT DETECTED Final   Listeria monocytogenes NOT DETECTED NOT DETECTED Final   Staphylococcus species NOT DETECTED NOT DETECTED Final   Staphylococcus aureus (BCID) NOT DETECTED NOT DETECTED Final   Staphylococcus epidermidis NOT DETECTED NOT DETECTED Final   Staphylococcus lugdunensis NOT DETECTED NOT DETECTED Final   Streptococcus species DETECTED (A) NOT DETECTED Final    Comment: Not Enterococcus species, Streptococcus agalactiae, Streptococcus pyogenes, or Streptococcus pneumoniae. CRITICAL RESULT CALLED TO, READ BACK BY AND VERIFIED WITH: PHARMD KELLY N [id number] FCP    Streptococcus agalactiae NOT DETECTED NOT DETECTED Final   Streptococcus pneumoniae  NOT DETECTED NOT DETECTED Final   Streptococcus pyogenes NOT DETECTED NOT DETECTED Final   A.calcoaceticus-baumannii NOT DETECTED NOT DETECTED Final   Bacteroides fragilis NOT DETECTED NOT DETECTED Final   Enterobacterales NOT DETECTED NOT DETECTED Final   Enterobacter cloacae complex NOT DETECTED NOT DETECTED Final   Escherichia coli NOT DETECTED NOT DETECTED Final   Klebsiella aerogenes NOT DETECTED NOT DETECTED Final   Klebsiella oxytoca NOT DETECTED NOT DETECTED Final   Klebsiella pneumoniae NOT DETECTED NOT DETECTED Final   Proteus species NOT DETECTED NOT DETECTED Final   Salmonella species NOT DETECTED NOT DETECTED Final   Serratia marcescens NOT DETECTED NOT DETECTED Final   Haemophilus influenzae NOT DETECTED NOT DETECTED Final   Neisseria meningitidis NOT DETECTED NOT DETECTED Final   Pseudomonas aeruginosa NOT DETECTED NOT DETECTED Final   Stenotrophomonas maltophilia NOT DETECTED NOT DETECTED Final   Candida albicans NOT DETECTED NOT DETECTED Final   Candida auris NOT DETECTED NOT DETECTED Final   Candida glabrata NOT DETECTED NOT DETECTED Final   Candida krusei NOT DETECTED NOT DETECTED Final   Candida parapsilosis NOT DETECTED NOT DETECTED Final   Candida tropicalis NOT DETECTED NOT DETECTED Final   Cryptococcus neoformans/gattii NOT DETECTED NOT DETECTED Final  Comment: Performed at [facility], [place] [address], [place], [place] [address]  Resp Panel by RT-PCR (Flu A&B, Covid) Nasopharyngeal Swab     Status: None   Collection Time: 12/15/20  8:08 PM   Specimen: Nasopharyngeal Swab; Nasopharyngeal(NP) swabs in vial transport medium  Result Value Ref Range Status   SARS Coronavirus 2 by RT PCR NEGATIVE NEGATIVE Final    Comment: (NOTE) SARS-CoV-2 target nucleic acids are NOT DETECTED.  The SARS-CoV-2 RNA is generally detectable in upper respiratory specimens during the acute phase of infection. The lowest concentration of SARS-CoV-2 viral copies this assay can  detect is 138 copies/mL. A negative result does not preclude SARS-Cov-2 infection and should not be used as the sole basis for treatment or other patient management decisions. A negative result may occur with  improper specimen collection/handling, submission of specimen other than nasopharyngeal swab, presence of viral mutation(s) within the areas targeted by this assay, and inadequate number of viral copies(<138 copies/mL). A negative result must be combined with clinical observations, patient history, and epidemiological information. The expected result is Negative.  Fact Sheet for Patients:  EntrepreneurPulse.com.au  Fact Sheet for Healthcare Providers:  IncredibleEmployment.be  This test is no t yet approved or cleared by the [place] FDA and  has been authorized for detection and/or diagnosis of SARS-CoV-2 by FDA under an Emergency Use Authorization (EUA). This EUA will remain  in effect (meaning this test can be used) for the duration of the COVID-19 declaration under Section 564(b)(1) of the Act, 21 U.S.C.section 360bbb-3(b)(1), unless the authorization is terminated  or revoked sooner.       Influenza A by PCR NEGATIVE NEGATIVE Final   Influenza B by PCR NEGATIVE NEGATIVE Final    Comment: (NOTE) The Xpert Xpress SARS-CoV-2/FLU/RSV plus assay is intended as an aid in the diagnosis of influenza from Nasopharyngeal swab specimens and should not be used as a sole basis for treatment. Nasal washings and aspirates are unacceptable for Xpert Xpress SARS-CoV-2/FLU/RSV testing.  Fact Sheet for Patients: EntrepreneurPulse.com.au  Fact Sheet for Healthcare Providers: IncredibleEmployment.be  This test is not yet approved or cleared by the [place] FDA and has been authorized for detection and/or diagnosis of SARS-CoV-2 by FDA under an Emergency Use Authorization (EUA). This EUA will remain in  effect (meaning this test can be used) for the duration of the COVID-19 declaration under Section 564(b)(1) of the Act, 21 U.S.C. section 360bbb-3(b)(1), unless the authorization is terminated or revoked.  Performed at [facility], [place], [place] [address]   Group A Strep by PCR     Status: None   Collection Time: 12/15/20  8:08 PM   Specimen: Nasopharyngeal Swab; Sterile Swab  Result Value Ref Range Status   Group A Strep by PCR NOT DETECTED NOT DETECTED Final    Comment: Performed at [facility], [address], [place], [place] [address]  Culture, blood (routine x 2)     Status: None (Preliminary result)   Collection Time: 12/15/20  9:09 PM   Specimen: BLOOD LEFT FOREARM  Result Value Ref Range Status   Specimen Description   Final    BLOOD LEFT FOREARM Performed at [facility], [address] [place], [place] [address]    Special Requests   Final    BOTTLES DRAWN AEROBIC AND ANAEROBIC Blood Culture adequate volume Performed at [facility], [address], [place],  [address]    Culture   Final  NO GROWTH 2 DAYS Performed at [facility], [place] [address] [place], [place] [address]    Report Status PENDING  Incomplete     New antibiotic prescription: 1/2 blood cultures positive for Strep mitis. Could be contaminant. Will perform wellness check. If improved, no further work-up. If no improvement, return to ED for further work-up.   ED Provider: Isla Pence, MD    Jimmy Footman, PharmD, BCPS, [facility] Clinical Pharmacist Phone: [phone] 12/18/2020, 10:01 AM

## 2020-12-21 LAB — CULTURE, BLOOD (ROUTINE X 2)
Culture: NO GROWTH
Special Requests: ADEQUATE

## 2020-12-26 ENCOUNTER — Telehealth: Payer: Self-pay | Admitting: Emergency Medicine

## 2021-06-26 ENCOUNTER — Emergency Department (HOSPITAL_COMMUNITY): Payer: Medicare Other

## 2021-06-26 ENCOUNTER — Other Ambulatory Visit: Payer: Self-pay

## 2021-06-26 ENCOUNTER — Emergency Department (HOSPITAL_COMMUNITY)
Admission: EM | Admit: 2021-06-26 | Discharge: 2021-06-27 | Disposition: A | Discharge status: Home / Self Care | Payer: Medicare Other | Attending: Emergency Medicine | Admitting: Emergency Medicine

## 2021-06-26 ENCOUNTER — Encounter (HOSPITAL_COMMUNITY): Payer: Self-pay

## 2021-06-26 DIAGNOSIS — Z20822 Contact with and (suspected) exposure to covid-19: Secondary | ICD-10-CM | POA: Diagnosis not present

## 2021-06-26 DIAGNOSIS — R059 Cough, unspecified: Secondary | ICD-10-CM | POA: Diagnosis present

## 2021-06-26 DIAGNOSIS — R051 Acute cough: Secondary | ICD-10-CM | POA: Insufficient documentation

## 2021-06-26 LAB — CBC WITH DIFFERENTIAL/PLATELET
Abs Immature Granulocytes: 0.02 10*3/uL (ref 0.00–0.07)
Basophils Absolute: 0.1 10*3/uL (ref 0.0–0.1)
Basophils Relative: 1 %
Eosinophils Absolute: 0.1 10*3/uL (ref 0.0–0.5)
Eosinophils Relative: 3 %
HCT: 40.5 % (ref 36.0–46.0)
Hemoglobin: 13.6 g/dL (ref 12.0–15.0)
Immature Granulocytes: 0 %
Lymphocytes Relative: 53 %
Lymphs Abs: 2.8 10*3/uL (ref 0.7–4.0)
MCH: 26.4 pg (ref 26.0–34.0)
MCHC: 33.6 g/dL (ref 30.0–36.0)
MCV: 78.6 fL — ABNORMAL LOW (ref 80.0–100.0)
Monocytes Absolute: 0.7 10*3/uL (ref 0.1–1.0)
Monocytes Relative: 14 %
Neutro Abs: 1.5 10*3/uL — ABNORMAL LOW (ref 1.7–7.7)
Neutrophils Relative %: 29 %
Platelets: 239 10*3/uL (ref 150–400)
RBC: 5.15 MIL/uL — ABNORMAL HIGH (ref 3.87–5.11)
RDW: 13.3 % (ref 11.5–15.5)
WBC: 5.1 10*3/uL (ref 4.0–10.5)
nRBC: 0 % (ref 0.0–0.2)

## 2021-06-26 LAB — CARBAMAZEPINE LEVEL, TOTAL: Carbamazepine Lvl: 10.7 ug/mL (ref 4.0–12.0)

## 2021-06-26 LAB — RESP PANEL BY RT-PCR (FLU A&B, COVID) ARPGX2
Influenza A by PCR: NEGATIVE
Influenza B by PCR: NEGATIVE
SARS Coronavirus 2 by RT PCR: NEGATIVE

## 2021-06-26 LAB — COMPREHENSIVE METABOLIC PANEL
ALT: 11 U/L (ref 0–44)
AST: 19 U/L (ref 15–41)
Albumin: 4 g/dL (ref 3.5–5.0)
Alkaline Phosphatase: 43 U/L (ref 38–126)
Anion gap: 7 (ref 5–15)
BUN: 7 mg/dL (ref 6–20)
CO2: 26 mmol/L (ref 22–32)
Calcium: 9.6 mg/dL (ref 8.9–10.3)
Chloride: 102 mmol/L (ref 98–111)
Creatinine, Ser: 0.3 mg/dL — ABNORMAL LOW (ref 0.44–1.00)
GFR, Estimated: >60 mL/min (ref >60)
Glucose, Bld: 92 mg/dL (ref 70–99)
Potassium: 4 mmol/L (ref 3.5–5.1)
Sodium: 135 mmol/L (ref 135–145)
Total Bilirubin: 0.7 mg/dL (ref 0.3–1.2)
Total Protein: 7.4 g/dL (ref 6.5–8.1)

## 2021-06-26 NOTE — ED Provider Notes (Signed)
[facility]-EMERGENCY DEPT Provider Note   CSN: [id number] Arrival date & time: 06/26/21  1725  LEVEL 5 CAVEAT - NONVERBAL  History  Chief Complaint  Patient presents with   Cough    Sheila Costa is a 36 y.o. female.  HPI 36 year old female with a history of mental retardation and spastic quadriparesis presents with possible cough.  History is quite limited at the beginning as EMS is no longer present, the patient is nonverbal, and no staff members are available.   After being in the ER for a while I was eventually able to get in touch with the manager of the group home and then eventually the on-call nurse.  It seems like the patient had some transient hypotension and lethargy.  Does not seem like she is at that now and the staff member indicated she seems to be at her mental baseline.  Home Medications Prior to Admission medications   Medication Sig Start Date End Date Taking? Authorizing Provider  baclofen (LIORESAL) 10 MG tablet Take 10-20 mg by mouth See admin instructions. 10 mg in the morning, then 20 mg twice daily at 1200 and 2000    [provider]  benzoyl peroxide 10 % gel Apply 1 application topically every evening.    [provider]  Carbamazepine (EQUETRO) 300 MG CP12 Take 600-900 mg by mouth See admin instructions. 600 mg every morning and 900 mg every night    [provider]  chlorhexidine (PERIDEX) 0.12 % solution Use as directed 10 mLs in the mouth or throat at bedtime.     [provider]  cholecalciferol (VITAMIN D) 1000 UNITS tablet Take 2,000 Units by mouth daily.    [provider]  dicyclomine (BENTYL) 20 MG tablet Take 1 tablet (20 mg total) by mouth 2 (two) times daily as needed for spasms. Patient not taking: No sig reported 02/22/16   Loren Racer, MD  diphenhydrAMINE (BENADRYL) 25 MG tablet Take 25 mg by mouth at bedtime.    [provider]  levOCARNitine (CARNITOR) 1  GM/10ML solution Take 500 mg by mouth at bedtime. 04/21/20   [provider]  magnesium oxide (MAG-OX) 400 (241.3 Mg) MG tablet Take 400 mg by mouth 3 (three) times daily.    [provider]  Multiple Vitamins-Minerals (MULTIVITAMIN WITH MINERALS) tablet Take 1 tablet by mouth daily.    [provider]  Nutritional Supplements (ENSURE ENLIVE PO) Take 237 mLs by mouth See admin instructions. With lunch on Monday's, Wednesday's, and Sunday's    [provider]  ondansetron (ZOFRAN-ODT) 4 MG disintegrating tablet Take 4 mg by mouth every 8 (eight) hours as needed for nausea. 04/21/20   [provider]  polyethylene glycol (MIRALAX / GLYCOLAX) packet Take 17 g by mouth daily. Mix with 4 ounces of prune juice.    [provider]  promethazine (PHENERGAN) 25 MG suppository Place 25 mg rectally See admin instructions. 25 mg every 4 to 6 hours as needed for vomiting 2 or more times in 4 hours 02/16/16   [provider]  promethazine (PHENERGAN) 25 MG tablet Take 25 mg by mouth See admin instructions. 25 mg every 4 to 6 hours as needed for vomiting 2 or more times in 4 hours 01/27/20   [provider]  scopolamine (TRANSDERM-SCOP) 1.5 MG Place 1 patch onto the skin every 3 (three) days. Behind ear.    [provider]  senna (SENOKOT) 8.6 MG TABS Take 1  tablet by mouth 2 (two) times daily.     [provider]  triazolam (HALCION) 0.25 MG tablet Take 0.25 mg by mouth See admin instructions. 0.25 mg prior to dental procedure as directed by the [facility] 01/11/20   [provider]  TRULANCE 3 MG TABS Take 3 mg by mouth daily. 04/07/20   [provider]  valproic acid (DEPAKENE) 250 MG/5ML solution Take 500 mg by mouth 3 (three) times daily.    [provider]      Allergies    Patient has no known allergies.    Review of Systems   Review of Systems  Unable to perform ROS: Patient  nonverbal    Physical Exam Updated Vital Signs BP 119/84   Pulse 85   Temp 98.7 F (37.1 C) (Oral)   Resp 16   Ht 5\' 3"  (1.6 m)   Wt 40.8 kg   SpO2 95%   BMI 15.94 kg/m  Physical Exam Vitals and nursing note reviewed.  Constitutional:      Appearance: She is well-developed and underweight.  HENT:     Head: Normocephalic and atraumatic.  Cardiovascular:     Rate and Rhythm: Normal rate and regular rhythm.     Heart sounds: Normal heart sounds.  Pulmonary:     Effort: Pulmonary effort is normal.     Breath sounds: Normal breath sounds.  Abdominal:     General: There is no distension.     Palpations: Abdomen is soft.  Skin:    General: Skin is warm and dry.  Neurological:     Mental Status: She is alert.     ED Results / Procedures / Treatments   Labs (all labs ordered are listed, but only abnormal results are displayed) Labs Reviewed  COMPREHENSIVE METABOLIC PANEL - Abnormal; Notable for the following components:      Result Value   Creatinine, Ser 0.30 (*)    All other components within normal limits  CBC WITH DIFFERENTIAL/PLATELET - Abnormal; Notable for the following components:   RBC 5.15 (*)    MCV 78.6 (*)    Neutro Abs 1.5 (*)    All other components within normal limits  RESP PANEL BY RT-PCR (FLU A&B, COVID) [id number]  CARBAMAZEPINE LEVEL, TOTAL    EKG EKG Interpretation  Date/Time:  Tuesday June 26 2021 20:49:23 EDT Ventricular Rate:  82 PR Interval:  156 QRS Duration: 62 QT Interval:  350 QTC Calculation: 408 R Axis:   98 Text Interpretation: Normal sinus rhythm Rightward axis Low voltage QRS Poor data quality in current ECG precludes serial comparison Confirmed by Pricilla LovelessGoldston, Magalene Mclear [id number]) on 06/26/2021 8:54:15 PM  Radiology DG Chest 2 View  Result Date: 06/26/2021 CLINICAL DATA:  Cough. EXAM: CHEST - 2 VIEW COMPARISON:  Chest x-ray 12/15/2020 FINDINGS: The heart size and mediastinal contours are within normal limits. Both lungs are clear.  Extensive thoracolumbar fusion hardware is present. No acute fractures are seen. There is gaseous distention of bowel in the upper abdomen. IMPRESSION: No active cardiopulmonary disease. Electronically Signed   By: Darliss CheneyAmy  Guttmann M.D.   On: 06/26/2021 18:39    Procedures Procedures    Medications Ordered in ED Medications - No data to display  ED Course/ Medical Decision Making/ A&P                           Medical Decision Making Amount and/or Complexity of Data Reviewed Independent Historian:  guardian and caregiver External Data Reviewed: radiology and notes. Labs: ordered. Radiology: ordered and independent interpretation performed. ECG/medicine tests: ordered and independent interpretation performed.   Difficult history, as both the patient is nonverbal and multiple people to talk to do not seem to know exactly what happened today.  It sounds like the most likely there is that she had a low measured blood pressure, which is now normal in emergency department.  She seems to be at her baseline.  Chest x-ray images viewed by myself and there is no pneumonia.  COVID testing is negative.  Given the questionable hypotension which was found out later in the course I ordered some labs and her carbamazepine level is in normal limits and there is no significant electrolyte disturbance or anemia to cause hypotension or weakness.  At this point she appears stable for discharge back to her group home.       Final Clinical Impression(s) / ED Diagnoses Final diagnoses:  Acute cough    Rx / DC Orders ED Discharge Orders     None         Pricilla Loveless, MD 06/26/21 2243

## 2021-06-26 NOTE — Discharge Instructions (Addendum)
If she develop fever, trouble breathing, vomiting, lethargy or any other new/concerning symptoms then return to the ER or call 911.

## 2021-06-26 NOTE — ED Triage Notes (Signed)
Pt bib ems from [facility] for cough.  Per ems pt's lungs sound rhonchi and pt actively coughing.

## 2021-07-13 ENCOUNTER — Ambulatory Visit (INDEPENDENT_AMBULATORY_CARE_PROVIDER_SITE_OTHER): Payer: Medicare Other | Admitting: Podiatry

## 2021-07-13 DIAGNOSIS — L600 Ingrowing nail: Secondary | ICD-10-CM

## 2021-07-13 DIAGNOSIS — B351 Tinea unguium: Secondary | ICD-10-CM

## 2021-07-14 NOTE — Progress Notes (Signed)
Subjective:   Patient ID: Sheila Costa, female   DOB: 36 y.o.   MRN: [id number]   HPI 36 year old female presents to the office today with a caregiver for concerns of toenail fungus as well as elongated toenails.  Caregiver is not quite sure what brings her to the office per the notes looks like it is for nail fungus.  They have not seen drainage or pus.  History limited.   Review of Systems  All other systems reviewed and are negative.  Past Medical History:  Diagnosis Date   Cerebral palsy (HCC)    Constipation    Dysphagia    Dysphagia    Encephalopathy    Failure to thrive (0-17)    Mental retardation    Osteopenia    Osteoporosis    Pressure ulcer 2008   right lower extremity   Scoliosis    Seizure disorder (HCC)    Seizures (HCC)    Spastic quadriparesis (HCC)    Static encephalopathy     Past Surgical History:  Procedure Laterality Date   BACK SURGERY     ESOPHAGOGASTRODUODENOSCOPY (EGD) WITH PROPOFOL N/A 02/24/2015   Procedure: ESOPHAGOGASTRODUODENOSCOPY (EGD) WITH PROPOFOL;  Surgeon: Graylin Shiver, MD;  Location: [facility] ENDOSCOPY;  Service: Endoscopy;  Laterality: N/A;   hardware in back       Current Outpatient Medications:    baclofen (LIORESAL) 10 MG tablet, Take 10-20 mg by mouth See admin instructions. 10 mg in the morning, then 20 mg twice daily at 1200 and 2000, Disp: , Rfl:    benzoyl peroxide 10 % gel, Apply 1 application topically every evening., Disp: , Rfl:    Carbamazepine (EQUETRO) 300 MG CP12, Take 600-900 mg by mouth See admin instructions. 600 mg every morning and 900 mg every night, Disp: , Rfl:    chlorhexidine (PERIDEX) 0.12 % solution, Use as directed 10 mLs in the mouth or throat at bedtime. , Disp: , Rfl:    cholecalciferol (VITAMIN D) 1000 UNITS tablet, Take 2,000 Units by mouth daily., Disp: , Rfl:    dicyclomine (BENTYL) 20 MG tablet, Take 1 tablet (20 mg total) by mouth 2 (two) times daily as needed for spasms. (Patient not taking: No  sig reported), Disp: 20 tablet, Rfl: 0   diphenhydrAMINE (BENADRYL) 25 MG tablet, Take 25 mg by mouth at bedtime., Disp: , Rfl:    levOCARNitine (CARNITOR) 1 GM/10ML solution, Take 500 mg by mouth at bedtime., Disp: , Rfl:    magnesium oxide (MAG-OX) 400 (241.3 Mg) MG tablet, Take 400 mg by mouth 3 (three) times daily., Disp: , Rfl:    Multiple Vitamins-Minerals (MULTIVITAMIN WITH MINERALS) tablet, Take 1 tablet by mouth daily., Disp: , Rfl:    Nutritional Supplements (ENSURE ENLIVE PO), Take 237 mLs by mouth See admin instructions. With lunch on Monday's, Wednesday's, and Sunday's, Disp: , Rfl:    ondansetron (ZOFRAN-ODT) 4 MG disintegrating tablet, Take 4 mg by mouth every 8 (eight) hours as needed for nausea., Disp: , Rfl:    polyethylene glycol (MIRALAX / GLYCOLAX) packet, Take 17 g by mouth daily. Mix with 4 ounces of prune juice., Disp: , Rfl:    promethazine (PHENERGAN) 25 MG suppository, Place 25 mg rectally See admin instructions. 25 mg every 4 to 6 hours as needed for vomiting 2 or more times in 4 hours, Disp: , Rfl: 0   promethazine (PHENERGAN) 25 MG tablet, Take 25 mg by mouth See admin instructions. 25 mg every 4 to 6 hours  as needed for vomiting 2 or more times in 4 hours, Disp: , Rfl:    scopolamine (TRANSDERM-SCOP) 1.5 MG, Place 1 patch onto the skin every 3 (three) days. Behind ear., Disp: , Rfl:    senna (SENOKOT) 8.6 MG TABS, Take 1 tablet by mouth 2 (two) times daily. , Disp: , Rfl:    triazolam (HALCION) 0.25 MG tablet, Take 0.25 mg by mouth See admin instructions. 0.25 mg prior to dental procedure as directed by the [facility], Disp: , Rfl:    TRULANCE 3 MG TABS, Take 3 mg by mouth daily., Disp: , Rfl:    valproic acid (DEPAKENE) 250 MG/5ML solution, Take 500 mg by mouth 3 (three) times daily., Disp: , Rfl:   No Known Allergies        Objective:  Physical Exam  General: NAD  Dermatological: Minimally hypertrophic, dystrophic yellow discoloration. Along  the right lateral nail border there is some dried blood on the corner there is no edema, erythema or signs of infection.  The right hallux nail is loose hypertrophic and dystrophic.  Mild incurvation of the nail border.  There is no open lesions.  Vascular: Dorsalis Pedis artery and Posterior Tibial artery pedal pulses are 2/4 bilateral with immedate capillary fill time. There is no pain with calf compression, swelling, warmth, erythema.   Neruologic: Spastic quadriplegia  Musculoskeletal: In wheelchair.    Assessment:   Onychomycosis, ingrown toenail right hallux     Plan:  Sharply debrided the nails with any complications or bleeding.  Particularly we will debride the right hallux nail.  There is some dried blood present there is no purulence or signs of infection at this time.  Discussed antibiotic ointment dressing changes daily.  Monitor for any signs or symptoms of infection.  At this point recommend routine debridement of the nails.  Vivi Barrack DPM

## 2021-10-15 ENCOUNTER — Ambulatory Visit: Payer: Medicare Other | Admitting: Podiatry

## 2022-07-05 ENCOUNTER — Other Ambulatory Visit: Payer: Self-pay | Admitting: Family Medicine

## 2022-07-05 DIAGNOSIS — Z993 Dependence on wheelchair: Secondary | ICD-10-CM

## 2022-08-20 ENCOUNTER — Emergency Department (HOSPITAL_COMMUNITY): Payer: Medicare Other

## 2022-08-20 ENCOUNTER — Emergency Department (HOSPITAL_COMMUNITY)
Admission: EM | Admit: 2022-08-20 | Discharge: 2022-08-20 | Disposition: A | Discharge status: Home / Self Care | Payer: Medicare Other | Attending: Emergency Medicine | Admitting: Emergency Medicine

## 2022-08-20 DIAGNOSIS — R112 Nausea with vomiting, unspecified: Secondary | ICD-10-CM | POA: Insufficient documentation

## 2022-08-20 DIAGNOSIS — R1033 Periumbilical pain: Secondary | ICD-10-CM | POA: Diagnosis not present

## 2022-08-20 DIAGNOSIS — K5909 Other constipation: Secondary | ICD-10-CM | POA: Diagnosis not present

## 2022-08-20 DIAGNOSIS — N854 Malposition of uterus: Secondary | ICD-10-CM | POA: Insufficient documentation

## 2022-08-20 DIAGNOSIS — R109 Unspecified abdominal pain: Secondary | ICD-10-CM | POA: Diagnosis present

## 2022-08-20 LAB — CBC WITH DIFFERENTIAL/PLATELET
Abs Immature Granulocytes: 0.02 10*3/uL (ref 0.00–0.07)
Basophils Absolute: 0.1 10*3/uL (ref 0.0–0.1)
Basophils Relative: 1 %
Eosinophils Absolute: 0 10*3/uL (ref 0.0–0.5)
Eosinophils Relative: 0 %
HCT: 39.2 % (ref 36.0–46.0)
Hemoglobin: 12.3 g/dL (ref 12.0–15.0)
Immature Granulocytes: 0 %
Lymphocytes Relative: 13 %
Lymphs Abs: 1.2 10*3/uL (ref 0.7–4.0)
MCH: 25.4 pg — ABNORMAL LOW (ref 26.0–34.0)
MCHC: 31.4 g/dL (ref 30.0–36.0)
MCV: 81 fL (ref 80.0–100.0)
Monocytes Absolute: 0.8 10*3/uL (ref 0.1–1.0)
Monocytes Relative: 9 %
Neutro Abs: 7 10*3/uL (ref 1.7–7.7)
Neutrophils Relative %: 77 %
Platelets: 306 10*3/uL (ref 150–400)
RBC: 4.84 MIL/uL (ref 3.87–5.11)
RDW: 13.2 % (ref 11.5–15.5)
WBC: 9 10*3/uL (ref 4.0–10.5)
nRBC: 0 % (ref 0.0–0.2)

## 2022-08-20 LAB — COMPREHENSIVE METABOLIC PANEL
ALT: 15 U/L (ref 0–44)
AST: 33 U/L (ref 15–41)
Albumin: 3.6 g/dL (ref 3.5–5.0)
Alkaline Phosphatase: 36 U/L — ABNORMAL LOW (ref 38–126)
Anion gap: 12 (ref 5–15)
BUN: 6 mg/dL (ref 6–20)
CO2: 21 mmol/L — ABNORMAL LOW (ref 22–32)
Calcium: 8.8 mg/dL — ABNORMAL LOW (ref 8.9–10.3)
Chloride: 100 mmol/L (ref 98–111)
Creatinine, Ser: 0.48 mg/dL (ref 0.44–1.00)
GFR, Estimated: >60 mL/min (ref >60)
Glucose, Bld: 124 mg/dL — ABNORMAL HIGH (ref 70–99)
Potassium: 4.1 mmol/L (ref 3.5–5.1)
Sodium: 133 mmol/L — ABNORMAL LOW (ref 135–145)
Total Bilirubin: 0.2 mg/dL — ABNORMAL LOW (ref 0.3–1.2)
Total Protein: 6.7 g/dL (ref 6.5–8.1)

## 2022-08-20 LAB — LIPASE, BLOOD: Lipase: 29 U/L (ref 11–51)

## 2022-08-20 LAB — HCG, QUANTITATIVE, PREGNANCY: hCG, Beta Chain, Quant, S: <1 m[IU]/mL (ref <5)

## 2022-08-20 MED ORDER — ONDANSETRON 4 MG PO TBDP: ORAL_TABLET | ORAL | 0 refills | Status: AC

## 2022-08-20 MED ORDER — SODIUM CHLORIDE 0.9 % IV BOLUS
1000.0000 mL | Freq: Once | INTRAVENOUS | Status: AC
  Administered 2022-08-20: 1000 mL via INTRAVENOUS

## 2022-08-20 MED ORDER — KETOROLAC TROMETHAMINE 30 MG/ML IJ SOLN
30.0000 mg | Freq: Once | INTRAMUSCULAR | Status: AC
  Administered 2022-08-20: 30 mg via INTRAVENOUS
  Filled 2022-08-20: qty 1

## 2022-08-20 MED ORDER — IOHEXOL 350 MG/ML SOLN
60.0000 mL | Freq: Once | INTRAVENOUS | Status: AC | PRN
  Administered 2022-08-20: 50 mL via INTRAVENOUS

## 2022-08-20 MED ORDER — ONDANSETRON HCL 4 MG/2ML IJ SOLN
4.0000 mg | Freq: Once | INTRAMUSCULAR | Status: AC
  Administered 2022-08-20: 4 mg via INTRAVENOUS
  Filled 2022-08-20: qty 2

## 2022-08-20 NOTE — ED Notes (Signed)
Report given to facility manager. Manager assisted in discharging pt by bring her home wheelchair. Pt was moved from stretcher to wheelchair with the assistance of facility manager and personal care assistant at bedside

## 2022-08-20 NOTE — ED Triage Notes (Signed)
Pt to the ed from the [facility] via ems with a CC of n/v. Pt has intellectual disabilities per staff at facility pt did not have a BM for 72 hours and was given a laxative. Pt had 2 Bms today and now has started to become nauseous and vomiting today. Pt is nonverbal at baseline. Pt appears to be in som discomfort.

## 2022-08-20 NOTE — ED Provider Notes (Signed)
Sheila Costa AT [facility] Provider Note   CSN: [id number] Arrival date & time: 08/20/22  1821     History  Chief Complaint  Patient presents with   Nausea    Sheila Costa is a 37 y.o. female history of developmental delay, chronic constipation, here presenting with abdominal pain.  Patient is chronically constipated.  Patient was given an enema by the nurse today.  She then had a large bowel movement and then started having abdominal cramps.  She had several episodes of vomiting as well.  Patient is nonverbal at baseline.  Patient appears to be in pain per the caregiver.  The history is provided by the patient and a significant other.       Home Medications Prior to Admission medications   Medication Sig Start Date End Date Taking? Authorizing Provider  baclofen (LIORESAL) 10 MG tablet Take 10-20 mg by mouth See admin instructions. 10 mg in the morning, then 20 mg twice daily at 1200 and 2000    [provider]  benzoyl peroxide 10 % gel Apply 1 application topically every evening.    [provider]  Carbamazepine (EQUETRO) 300 MG CP12 Take 600-900 mg by mouth See admin instructions. 600 mg every morning and 900 mg every night    [provider]  chlorhexidine (PERIDEX) 0.12 % solution Use as directed 10 mLs in the mouth or throat at bedtime.     [provider]  cholecalciferol (VITAMIN D) 1000 UNITS tablet Take 2,000 Units by mouth daily.    [provider]  dicyclomine (BENTYL) 20 MG tablet Take 1 tablet (20 mg total) by mouth 2 (two) times daily as needed for spasms. Patient not taking: No sig reported 02/22/16   Loren Racer, MD  diphenhydrAMINE (BENADRYL) 25 MG tablet Take 25 mg by mouth at bedtime.    [provider]  levOCARNitine (CARNITOR) 1 GM/10ML solution Take 500 mg by mouth at bedtime. 04/21/20   [provider]  magnesium oxide (MAG-OX) 400 (241.3 Mg) MG tablet Take 400  mg by mouth 3 (three) times daily.    [provider]  Multiple Vitamins-Minerals (MULTIVITAMIN WITH MINERALS) tablet Take 1 tablet by mouth daily.    [provider]  Nutritional Supplements (ENSURE ENLIVE PO) Take 237 mLs by mouth See admin instructions. With lunch on Monday's, Wednesday's, and Sunday's    [provider]  ondansetron (ZOFRAN-ODT) 4 MG disintegrating tablet Take 4 mg by mouth every 8 (eight) hours as needed for nausea. 04/21/20   [provider]  polyethylene glycol (MIRALAX / GLYCOLAX) packet Take 17 g by mouth daily. Mix with 4 ounces of prune juice.    [provider]  promethazine (PHENERGAN) 25 MG suppository Place 25 mg rectally See admin instructions. 25 mg every 4 to 6 hours as needed for vomiting 2 or more times in 4 hours 02/16/16   [provider]  promethazine (PHENERGAN) 25 MG tablet Take 25 mg by mouth See admin instructions. 25 mg every 4 to 6 hours as needed for vomiting 2 or more times in 4 hours 01/27/20   [provider]  scopolamine (TRANSDERM-SCOP) 1.5 MG Place 1 patch onto the skin every 3 (three) days. Behind ear.    [provider]  senna (SENOKOT) 8.6 MG TABS Take 1 tablet by mouth 2 (two) times daily.     [provider]  triazolam (HALCION) 0.25 MG tablet Take 0.25 mg by mouth See admin  instructions. 0.25 mg prior to dental procedure as directed by the [facility] 01/11/20   [provider]  TRULANCE 3 MG TABS Take 3 mg by mouth daily. 04/07/20   [provider]  valproic acid (DEPAKENE) 250 MG/5ML solution Take 500 mg by mouth 3 (three) times daily.    [provider]      Allergies    Patient has no known allergies.    Review of Systems   Review of Systems  Gastrointestinal:  Positive for abdominal pain.    Physical Exam Updated Vital Signs BP (!) 142/92 (BP Location: Right Arm)   Pulse (!) 107   Resp 16   Ht 5\' 3"  (1.6 m)   Wt  40 kg   SpO2 100%   BMI 15.62 kg/m  Physical Exam Vitals reviewed.  Constitutional:      Appearance: Normal appearance.  HENT:     Head: Normocephalic.     Nose: Nose normal.     Mouth/Throat:     Mouth: Mucous membranes are moist.  Eyes:     Extraocular Movements: Extraocular movements intact.     Pupils: Pupils are equal, round, and reactive to light.  Cardiovascular:     Rate and Rhythm: Normal rate and regular rhythm.     Pulses: Normal pulses.     Heart sounds: Normal heart sounds.  Pulmonary:     Effort: Pulmonary effort is normal.     Breath sounds: Normal breath sounds.  Abdominal:     Comments: Mild periumbilical tenderness.  No rebound or guarding.  Abdomen is soft  Musculoskeletal:        General: Normal range of motion.     Cervical back: Normal range of motion and neck supple.  Skin:    General: Skin is warm.     Capillary Refill: Capillary refill takes less than 2 seconds.  Neurological:     General: No focal deficit present.     Mental Status: She is alert.  Psychiatric:        Mood and Affect: Mood normal.     ED Results / Procedures / Treatments   Labs (all labs ordered are listed, but only abnormal results are displayed) Labs Reviewed  CBC WITH DIFFERENTIAL/PLATELET - Abnormal; Notable for the following components:      Result Value   MCH 25.4 (*)    All other components within normal limits  COMPREHENSIVE METABOLIC PANEL - Abnormal; Notable for the following components:   Sodium 133 (*)    CO2 21 (*)    Glucose, Bld 124 (*)    Calcium 8.8 (*)    Alkaline Phosphatase 36 (*)    Total Bilirubin 0.2 (*)    All other components within normal limits  LIPASE, BLOOD  HCG, QUANTITATIVE, PREGNANCY  URINALYSIS, ROUTINE W REFLEX MICROSCOPIC    EKG None  Radiology No results found.  Procedures Procedures    Medications Ordered in ED Medications  sodium chloride 0.9 % bolus 1,000 mL (1,000 mLs Intravenous New Bag/Given 08/20/22 1906)   ondansetron (ZOFRAN) injection 4 mg (4 mg Intravenous Given 08/20/22 1906)    ED Course/ Medical Decision Making/ A&P                                 Medical Decision Making Sheila Costa is a 37 y.o. female here presenting with abdominal pain and vomiting and diarrhea.  Patient is  constipated at baseline and given the enema.  I think she likely has side effects from the enema.  However since patient is nonverbal, will get a CT abdomen pelvis to rule out ileus versus obstruction.  Will also get CBC and CMP as well.  9:39 PM I reviewed patient's labs and independently interpreted CT scan.  Labs are unremarkable and CT showed no acute abnormality.  Patient is stable for discharge   Problems Addressed: Abdominal cramping: acute illness or injury  Amount and/or Complexity of Data Reviewed Labs: ordered. Decision-making details documented in ED Course. Radiology: ordered and independent interpretation performed. Decision-making details documented in ED Course.  Risk Prescription drug management.    Final Clinical Impression(s) / ED Diagnoses Final diagnoses:  None    Rx / DC Orders ED Discharge Orders     None         Charlynne Pander, MD 08/20/22 2139

## 2022-08-20 NOTE — Discharge Instructions (Addendum)
As we discussed, your CT scan was unremarkable.  You likely have side effect of the enema  You need to take MiraLAX daily.  I have prescribed Zofran as needed for nausea  You need to follow-up with your doctor  Return to ER if you have worse vomiting or severe abdominal pain

## 2022-08-28 NOTE — Progress Notes (Signed)
Patient needed HCG since she is child bearing age and needs CT ab/pel to r/o intra abdominal process

## 2022-11-19 ENCOUNTER — Emergency Department (HOSPITAL_COMMUNITY)
Admission: EM | Admit: 2022-11-19 | Discharge: 2022-11-19 | Disposition: A | Discharge status: Home / Self Care | Payer: Medicare Other | Attending: Emergency Medicine | Admitting: Emergency Medicine

## 2022-11-19 ENCOUNTER — Other Ambulatory Visit: Payer: Self-pay

## 2022-11-19 ENCOUNTER — Encounter (HOSPITAL_COMMUNITY): Payer: Self-pay

## 2022-11-19 ENCOUNTER — Emergency Department (HOSPITAL_COMMUNITY): Payer: Medicare Other

## 2022-11-19 DIAGNOSIS — G40909 Epilepsy, unspecified, not intractable, without status epilepticus: Secondary | ICD-10-CM

## 2022-11-19 DIAGNOSIS — D72819 Decreased white blood cell count, unspecified: Secondary | ICD-10-CM | POA: Insufficient documentation

## 2022-11-19 DIAGNOSIS — R000 Tachycardia, unspecified: Secondary | ICD-10-CM | POA: Insufficient documentation

## 2022-11-19 DIAGNOSIS — R569 Unspecified convulsions: Secondary | ICD-10-CM | POA: Insufficient documentation

## 2022-11-19 LAB — COMPREHENSIVE METABOLIC PANEL
ALT: 14 U/L (ref 0–44)
AST: 21 U/L (ref 15–41)
Albumin: 3.9 g/dL (ref 3.5–5.0)
Alkaline Phosphatase: 36 U/L — ABNORMAL LOW (ref 38–126)
Anion gap: 10 (ref 5–15)
BUN: 8 mg/dL (ref 6–20)
CO2: 24 mmol/L (ref 22–32)
Calcium: 8.7 mg/dL — ABNORMAL LOW (ref 8.9–10.3)
Chloride: 102 mmol/L (ref 98–111)
Creatinine, Ser: 0.46 mg/dL (ref 0.44–1.00)
GFR, Estimated: >60 mL/min (ref >60)
Glucose, Bld: 85 mg/dL (ref 70–99)
Potassium: 3.6 mmol/L (ref 3.5–5.1)
Sodium: 136 mmol/L (ref 135–145)
Total Bilirubin: 0.3 mg/dL (ref <1.2)
Total Protein: 6.9 g/dL (ref 6.5–8.1)

## 2022-11-19 LAB — CBC WITH DIFFERENTIAL/PLATELET
Abs Immature Granulocytes: 0.01 10*3/uL (ref 0.00–0.07)
Basophils Absolute: 0.1 10*3/uL (ref 0.0–0.1)
Basophils Relative: 2 %
Eosinophils Absolute: 0.1 10*3/uL (ref 0.0–0.5)
Eosinophils Relative: 2 %
HCT: 38.4 % (ref 36.0–46.0)
Hemoglobin: 12.4 g/dL (ref 12.0–15.0)
Immature Granulocytes: 0 %
Lymphocytes Relative: 34 %
Lymphs Abs: 0.9 10*3/uL (ref 0.7–4.0)
MCH: 26.2 pg (ref 26.0–34.0)
MCHC: 32.3 g/dL (ref 30.0–36.0)
MCV: 81 fL (ref 80.0–100.0)
Monocytes Absolute: 0.4 10*3/uL (ref 0.1–1.0)
Monocytes Relative: 15 %
Neutro Abs: 1.2 10*3/uL — ABNORMAL LOW (ref 1.7–7.7)
Neutrophils Relative %: 47 %
Platelets: 244 10*3/uL (ref 150–400)
RBC: 4.74 MIL/uL (ref 3.87–5.11)
RDW: 13.2 % (ref 11.5–15.5)
WBC: 2.6 10*3/uL — ABNORMAL LOW (ref 4.0–10.5)
nRBC: 0 % (ref 0.0–0.2)

## 2022-11-19 LAB — ACETAMINOPHEN LEVEL: Acetaminophen (Tylenol), Serum: <10 ug/mL — ABNORMAL LOW (ref 10–30)

## 2022-11-19 LAB — URINALYSIS, ROUTINE W REFLEX MICROSCOPIC
Bilirubin Urine: NEGATIVE
Glucose, UA: NEGATIVE mg/dL
Hgb urine dipstick: NEGATIVE
Ketones, ur: NEGATIVE mg/dL
Leukocytes,Ua: NEGATIVE
Nitrite: NEGATIVE
Protein, ur: NEGATIVE mg/dL
Specific Gravity, Urine: 1.008 (ref 1.005–1.030)
pH: 8 (ref 5.0–8.0)

## 2022-11-19 LAB — SALICYLATE LEVEL: Salicylate Lvl: <7 mg/dL — ABNORMAL LOW (ref 7.0–30.0)

## 2022-11-19 LAB — HCG, SERUM, QUALITATIVE: Preg, Serum: NEGATIVE

## 2022-11-19 LAB — CARBAMAZEPINE LEVEL, TOTAL: Carbamazepine Lvl: 8.7 ug/mL (ref 4.0–12.0)

## 2022-11-19 LAB — ETHANOL: Alcohol, Ethyl (B): <10 mg/dL (ref <10)

## 2022-11-19 LAB — VALPROIC ACID LEVEL: Valproic Acid Lvl: <10 ug/mL — ABNORMAL LOW (ref 50.0–100.0)

## 2022-11-19 LAB — AMMONIA: Ammonia: 17 umol/L (ref 9–35)

## 2022-11-19 MED ORDER — LACTATED RINGERS IV BOLUS
1000.0000 mL | Freq: Once | INTRAVENOUS | Status: AC
  Administered 2022-11-19: 1000 mL via INTRAVENOUS

## 2022-11-19 MED ORDER — LORAZEPAM 2 MG/ML IJ SOLN
1.0000 mg | Freq: Once | INTRAMUSCULAR | Status: AC
  Administered 2022-11-19: 1 mg via INTRAVENOUS
  Filled 2022-11-19: qty 1

## 2022-11-19 MED ORDER — VALPROIC ACID 250 MG/5ML PO SOLN: 500.0000 mg | Freq: Three times a day (TID) | ORAL | 0 refills | Status: AC

## 2022-11-19 MED ORDER — VALPROATE SODIUM 100 MG/ML IV SOLN
1000.0000 mg | Freq: Once | INTRAVENOUS | Status: AC
  Administered 2022-11-19: 1000 mg via INTRAVENOUS
  Filled 2022-11-19: qty 10

## 2022-11-19 NOTE — Discharge Instructions (Signed)
Take your seizure medications as prescribed and follow-up with your primary doctor as well as neurologist.  Return to the ED with new or worsening symptoms.

## 2022-11-19 NOTE — ED Triage Notes (Signed)
Pt BIB EMS from [facility] for seizures. The nurse at the facility witnessed her seizure which lasted about 6 minutes. Per EMS pt "seemed sleepy but not shaking upon arrival". Pt is also nonverbal  Hx of seizures, cerebral palsy, and encephalopathy   124/68 102 HR 98% RA 169 cbg

## 2022-11-19 NOTE — ED Notes (Signed)
Attempt made to contact the legal guardian x2. Both times the call could not be completed.

## 2022-11-19 NOTE — ED Provider Notes (Signed)
[place] EMERGENCY DEPARTMENT AT [facility] Provider Note   CSN: [id number] Arrival date & time: 11/19/22  0347     History  Chief Complaint  Patient presents with   Seizures    Sheila Costa is a 37 y.o. female.  Level 5 caveat for nonverbal.  Patient here with caregiver after witnessed seizure.  Does have a seizure disorder.  Caregiver reports she started having a "full body seizure" that lasted for about 6 minutes and resolved on its own.  Did not fall or hit her head.  No tongue biting or incontinence.  Back to baseline now.  No recent illnesses.  No fever, chills, nausea, vomiting, chest pain or shortness of breath. Notably has not had her valproic acid for about 2 weeks per her caregiver.  She is no longer on carbamazepine that appears.  No recent seizure medication changes.  Unknown when her last seizure was.  No drug or alcohol use.  She is back to baseline now.  The history is provided by the patient and a caregiver. The history is limited by a developmental delay.  Seizures      Home Medications Prior to Admission medications   Medication Sig Start Date End Date Taking? Authorizing Provider  baclofen (LIORESAL) 10 MG tablet Take 10-20 mg by mouth See admin instructions. 10 mg in the morning, then 20 mg twice daily at 1200 and 2000    [provider]  benzoyl peroxide 10 % gel Apply 1 application topically every evening.    [provider]  Carbamazepine (EQUETRO) 300 MG CP12 Take 600-900 mg by mouth See admin instructions. 600 mg every morning and 900 mg every night    [provider]  chlorhexidine (PERIDEX) 0.12 % solution Use as directed 10 mLs in the mouth or throat at bedtime.     [provider]  cholecalciferol (VITAMIN D) 1000 UNITS tablet Take 2,000 Units by mouth daily.    [provider]  dicyclomine (BENTYL) 20 MG tablet Take 1 tablet (20 mg total) by mouth 2 (two) times daily as needed for  spasms. Patient not taking: No sig reported 02/22/16   Loren Racer, MD  diphenhydrAMINE (BENADRYL) 25 MG tablet Take 25 mg by mouth at bedtime.    [provider]  levOCARNitine (CARNITOR) 1 GM/10ML solution Take 500 mg by mouth at bedtime. 04/21/20   [provider]  magnesium oxide (MAG-OX) 400 (241.3 Mg) MG tablet Take 400 mg by mouth 3 (three) times daily.    [provider]  Multiple Vitamins-Minerals (MULTIVITAMIN WITH MINERALS) tablet Take 1 tablet by mouth daily.    [provider]  Nutritional Supplements (ENSURE ENLIVE PO) Take 237 mLs by mouth See admin instructions. With lunch on Monday's, Wednesday's, and Sunday's    [provider]  ondansetron (ZOFRAN-ODT) 4 MG disintegrating tablet 4mg  ODT q4 hours prn nausea/vomit 08/20/22   Charlynne Pander, MD  polyethylene glycol [facility] Ethelene Hal) packet Take 17 g by mouth daily. Mix with 4 ounces of prune juice.    [provider]  promethazine (PHENERGAN) 25 MG suppository Place 25 mg rectally See admin instructions. 25 mg every 4 to 6 hours as needed for vomiting 2 or more times in 4 hours 02/16/16   [provider]  promethazine (PHENERGAN) 25 MG tablet Take 25 mg by mouth See admin instructions. 25 mg every 4 to 6 hours as needed for vomiting 2 or more times in 4 hours 01/27/20  [provider]  scopolamine (TRANSDERM-SCOP) 1.5 MG Place 1 patch onto the skin every 3 (three) days. Behind ear.    [provider]  senna (SENOKOT) 8.6 MG TABS Take 1 tablet by mouth 2 (two) times daily.     [provider]  triazolam (HALCION) 0.25 MG tablet Take 0.25 mg by mouth See admin instructions. 0.25 mg prior to dental procedure as directed by the [facility] 01/11/20   [provider]  TRULANCE 3 MG TABS Take 3 mg by mouth daily. 04/07/20   [provider]  valproic acid (DEPAKENE) 250 MG/5ML solution Take 500 mg by mouth 3 (three)  times daily.    [provider]      Allergies    Patient has no known allergies.    Review of Systems   Review of Systems  Unable to perform ROS: Patient nonverbal  Neurological:  Positive for seizures.    Physical Exam Updated Vital Signs BP 101/75   Pulse (!) 106   Temp 97.9 F (36.6 C) (Axillary)   Resp 20   SpO2 98%  Physical Exam Vitals and nursing note reviewed.  Constitutional:      General: She is not in acute distress.    Appearance: She is well-developed.     Comments: Chronic ill-appearing, moans, nonverbal  HENT:     Head: Normocephalic and atraumatic.     Mouth/Throat:     Pharynx: No oropharyngeal exudate.  Eyes:     Conjunctiva/sclera: Conjunctivae normal.     Pupils: Pupils are equal, round, and reactive to light.  Neck:     Comments: No meningismus. Cardiovascular:     Rate and Rhythm: Regular rhythm. Tachycardia present.     Heart sounds: Normal heart sounds. No murmur heard. Pulmonary:     Effort: Pulmonary effort is normal. No respiratory distress.     Breath sounds: Normal breath sounds.  Abdominal:     Palpations: Abdomen is soft.     Tenderness: There is no abdominal tenderness. There is no guarding or rebound.  Musculoskeletal:        General: No tenderness. Normal range of motion.     Cervical back: Normal range of motion and neck supple.  Skin:    General: Skin is warm.  Neurological:     Mental Status: She is alert.     Motor: No abnormal muscle tone.     Comments: Moves all extremities spontaneously, does not speak or follow commands.  Psychiatric:        Behavior: Behavior normal.     ED Results / Procedures / Treatments   Labs (all labs ordered are listed, but only abnormal results are displayed) Labs Reviewed  CBC WITH DIFFERENTIAL/PLATELET - Abnormal; Notable for the following components:      Result Value   WBC 2.6 (*)    Neutro Abs 1.2 (*)    All other components within normal limits  COMPREHENSIVE  METABOLIC PANEL - Abnormal; Notable for the following components:   Calcium 8.7 (*)    Alkaline Phosphatase 36 (*)    All other components within normal limits  ACETAMINOPHEN LEVEL - Abnormal; Notable for the following components:   Acetaminophen (Tylenol), Serum <10 (*)    All other components within normal limits  SALICYLATE LEVEL - Abnormal; Notable for the following components:   Salicylate Lvl <7.0 (*)    All other components within normal limits  VALPROIC ACID LEVEL - Abnormal; Notable for the following components:  Valproic Acid Lvl <10 (*)    All other components within normal limits  ETHANOL  HCG, SERUM, QUALITATIVE  URINALYSIS, ROUTINE W REFLEX MICROSCOPIC  CARBAMAZEPINE LEVEL, TOTAL  AMMONIA    EKG None  Radiology CT Head Wo Contrast  Result Date: 11/19/2022 CLINICAL DATA:  37 year old female with witnessed seizure like activity for 6 minutes. Cerebral palsy. EXAM: CT HEAD WITHOUT CONTRAST TECHNIQUE: Contiguous axial images were obtained from the base of the skull through the vertex without intravenous contrast. RADIATION DOSE REDUCTION: This exam was performed according to the departmental dose-optimization program which includes automated exposure control, adjustment of the mA and/or kV according to patient size and/or use of iterative reconstruction technique. COMPARISON:  Head CT 11/28/2015. FINDINGS: Study is intermittently degraded by motion artifact despite repeated imaging attempts. Brain: Stable cerebral volume., decreased over expected for age. No midline shift, ventriculomegaly, mass effect, evidence of mass lesion, intracranial hemorrhage or evidence of cortically based acute infarction. Chronic calcified cortical encephalomalacia or laminar necrosis at the left lateral perirolandic area. Bulky dural calcification along the right tentorium. Stable gray-white matter differentiation throughout the brain. Vascular: No suspicious intracranial vascular hyperdensity.  Skull: Chronic hyperostosis, diffuse skull thickening. No acute osseous abnormality identified. Sinuses/Orbits: Visualized paranasal sinuses and mastoids are stable and well aerated. Other: Visualized scalp soft tissues are within normal limits. Grossly negative orbits soft tissues. IMPRESSION: No acute intracranial abnormality. 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 Electronically Signed   By: Odessa Fleming M.D.   On: 11/19/2022 08:47    Procedures Procedures    Medications Ordered in ED Medications  lactated ringers bolus 1,000 mL (has no administration in time range)    ED Course/ Medical Decision Making/ A&P                                 Medical Decision Making Amount and/or Complexity of Data Reviewed Independent Historian: caregiver Labs: ordered. Decision-making details documented in ED Course. Radiology: ordered and independent interpretation performed. Decision-making details documented in ED Course. ECG/medicine tests: ordered and independent interpretation performed. Decision-making details documented in ED Course.  Risk Prescription drug management.   Breakthrough seizure in setting of noncompliance with seizure medications.  Back to baseline now.  Tachycardic but afebrile.  Has not had her valproic acid for 2 weeks. Will load with 1000 mg of Depakote.  She is supposed to be on 500 mg 3 times daily. Discussed with Dr. Selina Cooley of neurology who agrees.  Discussed with patient's legal guardian Selinda Flavin.  Annice Pih not aware that patient has not been receiving her Depakote for 2 weeks.  Afebrile.  Leukopenia noted of 2.6.  Heart rate has improved to the 80s and 90s with IV fluids.  Valproic acid level is undetectable as expected.  Her CT head is stable.  No acute findings. Electrolytes stable.  Urinalysis negative.  Carbamazepine level is therapeutic.  Depakote level undetectable.  Patient given IV loading dose of Depakote after discussion with  neurology.  She is back to her baseline per caregivers in the room.  Tachycardia has resolved.  She is afebrile.  She is back to baseline per caregiver in the room.  Tachycardia has resolved.  Unclear why she has not been receiving her Depakote at her facility.  Caregiver not aware of this issue but will represcribe. Follow-up with her PCP as well as neurologist.  Return precautions discussed        Final Clinical Impression(s) /  ED Diagnoses Final diagnoses:  None    Rx / DC Orders ED Discharge Orders     None         Kyona Chauncey, Sheila Senior, MD 11/19/22 1016

## 2022-11-19 NOTE — ED Notes (Signed)
Attempted to contact the legal guardian x3.

## 2022-11-19 NOTE — ED Notes (Signed)
Final attempt made to contact the guardian. Naval architect made aware. Permission given to allow the patient to discharge.

## 2022-11-19 NOTE — ED Notes (Signed)
AVS reviewed by previous RN.

## 2022-11-19 NOTE — ED Notes (Signed)
Patient's ride has arrived.  

## 2023-07-05 ENCOUNTER — Emergency Department (HOSPITAL_COMMUNITY)
Admission: EM | Admit: 2023-07-05 | Discharge: 2023-07-06 | Disposition: A | Discharge status: Home / Self Care | Attending: Emergency Medicine | Admitting: Emergency Medicine

## 2023-07-05 ENCOUNTER — Encounter (HOSPITAL_COMMUNITY): Payer: Self-pay

## 2023-07-05 ENCOUNTER — Other Ambulatory Visit: Payer: Self-pay

## 2023-07-05 DIAGNOSIS — R1110 Vomiting, unspecified: Secondary | ICD-10-CM | POA: Insufficient documentation

## 2023-07-05 DIAGNOSIS — R109 Unspecified abdominal pain: Secondary | ICD-10-CM | POA: Insufficient documentation

## 2023-07-05 DIAGNOSIS — E871 Hypo-osmolality and hyponatremia: Secondary | ICD-10-CM | POA: Insufficient documentation

## 2023-07-05 LAB — URINALYSIS, ROUTINE W REFLEX MICROSCOPIC
Bacteria, UA: NONE SEEN
Bilirubin Urine: NEGATIVE
Glucose, UA: NEGATIVE mg/dL
Hgb urine dipstick: NEGATIVE
Ketones, ur: NEGATIVE mg/dL
Leukocytes,Ua: NEGATIVE
Nitrite: NEGATIVE
Protein, ur: 30 mg/dL — AB
Specific Gravity, Urine: 1.018 (ref 1.005–1.030)
pH: 7 (ref 5.0–8.0)

## 2023-07-05 LAB — COMPREHENSIVE METABOLIC PANEL WITH GFR
ALT: 9 U/L (ref 0–44)
AST: 22 U/L (ref 15–41)
Albumin: 3.5 g/dL (ref 3.5–5.0)
Alkaline Phosphatase: 40 U/L (ref 38–126)
Anion gap: 13 (ref 5–15)
BUN: 7 mg/dL (ref 6–20)
CO2: 19 mmol/L — ABNORMAL LOW (ref 22–32)
Calcium: 8.8 mg/dL — ABNORMAL LOW (ref 8.9–10.3)
Chloride: 98 mmol/L (ref 98–111)
Creatinine, Ser: 0.36 mg/dL — ABNORMAL LOW (ref 0.44–1.00)
GFR, Estimated: >60 mL/min (ref >60)
Glucose, Bld: 141 mg/dL — ABNORMAL HIGH (ref 70–99)
Potassium: 3.9 mmol/L (ref 3.5–5.1)
Sodium: 130 mmol/L — ABNORMAL LOW (ref 135–145)
Total Bilirubin: 0.5 mg/dL (ref 0.0–1.2)
Total Protein: 7 g/dL (ref 6.5–8.1)

## 2023-07-05 LAB — CBC
HCT: 36.2 % (ref 36.0–46.0)
Hemoglobin: 12.1 g/dL (ref 12.0–15.0)
MCH: 26.1 pg (ref 26.0–34.0)
MCHC: 33.4 g/dL (ref 30.0–36.0)
MCV: 78.2 fL — ABNORMAL LOW (ref 80.0–100.0)
Platelets: 334 10*3/uL (ref 150–400)
RBC: 4.63 MIL/uL (ref 3.87–5.11)
RDW: 12.7 % (ref 11.5–15.5)
WBC: 5.5 10*3/uL (ref 4.0–10.5)
nRBC: 0 % (ref 0.0–0.2)

## 2023-07-05 LAB — LIPASE, BLOOD: Lipase: 24 U/L (ref 11–51)

## 2023-07-05 MED ORDER — SODIUM CHLORIDE 0.9 % IV BOLUS
500.0000 mL | Freq: Once | INTRAVENOUS | Status: AC
  Administered 2023-07-05: 500 mL via INTRAVENOUS

## 2023-07-05 MED ORDER — ONDANSETRON HCL 4 MG/2ML IJ SOLN
4.0000 mg | Freq: Once | INTRAMUSCULAR | Status: DC
  Filled 2023-07-05: qty 2

## 2023-07-05 MED ORDER — LORAZEPAM 2 MG/ML IJ SOLN
1.0000 mg | Freq: Once | INTRAMUSCULAR | Status: AC
  Administered 2023-07-05: 1 mg via INTRAVENOUS
  Filled 2023-07-05: qty 1

## 2023-07-05 NOTE — ED Triage Notes (Signed)
 Pt is coming in from a group home, [facility], she is coming in for emesis and nausea and diarrhea x 1 days, it started after she took her medications. Staff reports she has been lethargic all day. She is non-verbal but can makes sounds. She is accompanied by Group home member.   Medic vitals   100 palp bp 60hr 120bgl 18rr

## 2023-07-05 NOTE — ED Provider Notes (Signed)
 [place] EMERGENCY DEPARTMENT AT [facility] Provider Note   CSN: [id number] Arrival date & time: 07/05/23  2124     Patient presents with: Nausea   Sheila Costa is a 38 y.o. female.  {Add pertinent medical, surgical, social history, OB history to HPI:747} 38 year old female brought in by EMS from group home with support person at bedside. Patient with vomiting after dinner tonight, appears uncomfortable/moaning when staff says she is generally a happy person. Patient is non verbal at baseline. No reports of fever. History of UTIs, urine output unknown, wears a diaper.        Prior to Admission medications   Medication Sig Start Date End Date Taking? Authorizing Provider  baclofen  (LIORESAL ) 10 MG tablet Take 10-20 mg by mouth See admin instructions. 10 mg in the morning, then 20 mg twice daily at 1200 and 2000    [provider]  benzoyl peroxide  10 % gel Apply 1 application topically every evening.    [provider]  Carbamazepine  (EQUETRO ) 300 MG CP12 Take 600-900 mg by mouth See admin instructions. 600 mg every morning and 900 mg every night    [provider]  chlorhexidine  (PERIDEX ) 0.12 % solution Use as directed 10 mLs in the mouth or throat at bedtime.     [provider]  cholecalciferol  (VITAMIN D ) 1000 UNITS tablet Take 2,000 Units by mouth daily.    [provider]  dicyclomine  (BENTYL ) 20 MG tablet Take 1 tablet (20 mg total) by mouth 2 (two) times daily as needed for spasms. Patient not taking: No sig reported 02/22/16   Carlyle Lenis, MD  diphenhydrAMINE  (BENADRYL ) 25 MG tablet Take 25 mg by mouth at bedtime.    [provider]  levOCARNitine  (CARNITOR ) 1 GM/10ML solution Take 500 mg by mouth at bedtime. 04/21/20   [provider]  magnesium  oxide (MAG-OX) 400 (241.3 Mg) MG tablet Take 400 mg by mouth 3 (three) times daily.    [provider]  Multiple Vitamins-Minerals  (MULTIVITAMIN WITH MINERALS) tablet Take 1 tablet by mouth daily.    [provider]  Nutritional Supplements (ENSURE ENLIVE PO) Take 237 mLs by mouth See admin instructions. With lunch on Monday's, Wednesday's, and Sunday's    [provider]  ondansetron  (ZOFRAN -ODT) 4 MG disintegrating tablet 4mg  ODT q4 hours prn nausea/vomit 08/20/22   Yao, David Hsienta, MD  polyethylene glycol (MIRALAX  / GLYCOLAX ) packet Take 17 g by mouth daily. Mix with 4 ounces of prune juice.    [provider]  promethazine  (PHENERGAN ) 25 MG suppository Place 25 mg rectally See admin instructions. 25 mg every 4 to 6 hours as needed for vomiting 2 or more times in 4 hours 02/16/16   [provider]  promethazine  (PHENERGAN ) 25 MG tablet Take 25 mg by mouth See admin instructions. 25 mg every 4 to 6 hours as needed for vomiting 2 or more times in 4 hours 01/27/20   [provider]  scopolamine  (TRANSDERM-SCOP) 1.5 MG Place 1 patch onto the skin every 3 (three) days. Behind ear.    [provider]  senna (SENOKOT) 8.6 MG TABS Take 1 tablet by mouth 2 (two) times daily.     [provider]  triazolam (HALCION) 0.25 MG tablet Take 0.25 mg by mouth See admin instructions. 0.25 mg prior to dental procedure as directed by the [facility] 01/11/20   [provider]  TRULANCE 3 MG TABS Take 3 mg  by mouth daily. 04/07/20   [provider]  valproic  acid (DEPAKENE ) 250 MG/5ML solution Take 10 mLs (500 mg total) by mouth 3 (three) times daily. 11/19/22 12/19/22  Carita Senior, MD    Allergies: Patient has no known allergies.    Review of Systems Level 5 caveat for non verbal patient  Updated Vital Signs BP (!) 123/95   Pulse 60   Temp 98.5 F (36.9 C) (Axillary)   Resp 17   SpO2 98%   Physical Exam Vitals and nursing note reviewed.  Constitutional:      Appearance: She is well-developed. She is not diaphoretic.     Comments: Moaning,  lying in fetal position, appears uncomfortable   HENT:     Head: Normocephalic and atraumatic.     Mouth/Throat:     Mouth: Mucous membranes are moist.   Cardiovascular:     Rate and Rhythm: Normal rate and regular rhythm.     Pulses: Normal pulses.  Pulmonary:     Effort: Pulmonary effort is normal.     Breath sounds: Normal breath sounds.  Abdominal:     General: Bowel sounds are decreased.     Palpations: Abdomen is soft.     Tenderness: There is no guarding.   Musculoskeletal:     Right lower leg: No edema.     Left lower leg: No edema.   Skin:    General: Skin is warm and dry.     Findings: No erythema.   Neurological:     Mental Status: She is alert. Mental status is at baseline.   Psychiatric:        Behavior: Behavior normal.     (all labs ordered are listed, but only abnormal results are displayed) Labs Reviewed  LIPASE, BLOOD  COMPREHENSIVE METABOLIC PANEL WITH GFR  CBC  URINALYSIS, ROUTINE W REFLEX MICROSCOPIC    EKG: None  Radiology: No results found.  {Document cardiac monitor, telemetry assessment procedure when appropriate:32947} Procedures   Medications Ordered in the ED  LORazepam  (ATIVAN ) injection 1 mg (has no administration in time range)  ondansetron  (ZOFRAN ) injection 4 mg (has no administration in time range)  sodium chloride  0.9 % bolus 500 mL (has no administration in time range)      {Click here for ABCD2, HEART and other calculators REFRESH Note before signing:1}                              Medical Decision Making Amount and/or Complexity of Data Reviewed Radiology: ordered.  Risk Prescription drug management.   This patient presents to the ED for concern of ***, this involves an extensive number of treatment options, and is a complaint that carries with it a high risk of complications and morbidity.  The differential diagnosis includes ***   Co morbidities / Chronic conditions that complicate the patient  evaluation  ***   Additional history obtained:  Additional history obtained from EMR External records from outside source obtained and reviewed including ***   Lab Tests:  I Ordered, and personally interpreted labs.  The pertinent results include:  ***   Imaging Studies ordered:  I ordered imaging studies including ***  I independently visualized and interpreted imaging which showed *** I agree with the radiologist interpretation   Cardiac Monitoring: / EKG:  The patient was maintained on a cardiac monitor.  I personally viewed and interpreted the cardiac monitored which showed an underlying rhythm of: ***  Problem List / ED Course / Critical interventions / Medication management  *** I ordered medication including ***   Reevaluation of the patient after these medicines showed that the patient *** I have reviewed the patients home medicines and have made adjustments as needed   Consultations Obtained:  I requested consultation with the ***,  and discussed lab and imaging findings as well as pertinent plan - they recommend: ***   Social Determinants of Health:  ***   Test / Admission - Considered:  ***   {Document critical care time when appropriate  Document review of labs and clinical decision tools ie CHADS2VASC2, etc  Document your independent review of radiology images and any outside records  Document your discussion with family members, caretakers and with consultants  Document social determinants of health affecting pt's care  Document your decision making why or why not admission, treatments were needed:32947:::1}   Final diagnoses:  None    ED Discharge Orders     None

## 2023-07-06 ENCOUNTER — Emergency Department (HOSPITAL_COMMUNITY)

## 2023-07-06 DIAGNOSIS — R1110 Vomiting, unspecified: Secondary | ICD-10-CM | POA: Diagnosis not present

## 2023-07-06 MED ORDER — IOHEXOL 300 MG/ML  SOLN
100.0000 mL | Freq: Once | INTRAMUSCULAR | Status: AC | PRN
  Administered 2023-07-06: 100 mL via INTRAVENOUS

## 2023-07-06 NOTE — Discharge Instructions (Signed)
 Home to rest and hydrate. Return to the ER for worsening or concerning symptoms.  Recheck with your primary care provider.

## 2023-07-06 NOTE — ED Notes (Signed)
 Patient transported to CT

## 2023-07-06 NOTE — ED Notes (Signed)
 Group home staff at bedside kept paperwork. Assisted with transferring pt to wheelchair and group home staff transported pt back to group home.

## 2023-08-25 ENCOUNTER — Emergency Department (HOSPITAL_COMMUNITY): Admission: EM | Admit: 2023-08-25 | Discharge: 2023-08-25 | Disposition: A | Discharge status: Home / Self Care

## 2023-08-25 ENCOUNTER — Emergency Department (HOSPITAL_COMMUNITY)

## 2023-08-25 DIAGNOSIS — R112 Nausea with vomiting, unspecified: Secondary | ICD-10-CM | POA: Diagnosis present

## 2023-08-25 DIAGNOSIS — D72829 Elevated white blood cell count, unspecified: Secondary | ICD-10-CM | POA: Insufficient documentation

## 2023-08-25 DIAGNOSIS — R1110 Vomiting, unspecified: Secondary | ICD-10-CM

## 2023-08-25 LAB — COMPREHENSIVE METABOLIC PANEL WITH GFR
ALT: 12 U/L (ref 0–44)
AST: 25 U/L (ref 15–41)
Albumin: 4.1 g/dL (ref 3.5–5.0)
Alkaline Phosphatase: 46 U/L (ref 38–126)
Anion gap: 14 (ref 5–15)
BUN: 7 mg/dL (ref 6–20)
CO2: 22 mmol/L (ref 22–32)
Calcium: 9.8 mg/dL (ref 8.9–10.3)
Chloride: 94 mmol/L — ABNORMAL LOW (ref 98–111)
Creatinine, Ser: 0.36 mg/dL — ABNORMAL LOW (ref 0.44–1.00)
GFR, Estimated: >60 mL/min (ref >60)
Glucose, Bld: 125 mg/dL — ABNORMAL HIGH (ref 70–99)
Potassium: 3.7 mmol/L (ref 3.5–5.1)
Sodium: 130 mmol/L — ABNORMAL LOW (ref 135–145)
Total Bilirubin: 0.4 mg/dL (ref 0.0–1.2)
Total Protein: 8 g/dL (ref 6.5–8.1)

## 2023-08-25 LAB — CBC WITH DIFFERENTIAL/PLATELET
Abs Immature Granulocytes: 0.04 K/uL (ref 0.00–0.07)
Basophils Absolute: 0 K/uL (ref 0.0–0.1)
Basophils Relative: 0 %
Eosinophils Absolute: 0 K/uL (ref 0.0–0.5)
Eosinophils Relative: 0 %
HCT: 41.1 % (ref 36.0–46.0)
Hemoglobin: 13.3 g/dL (ref 12.0–15.0)
Immature Granulocytes: 0 %
Lymphocytes Relative: 8 %
Lymphs Abs: 0.9 K/uL (ref 0.7–4.0)
MCH: 25.2 pg — ABNORMAL LOW (ref 26.0–34.0)
MCHC: 32.4 g/dL (ref 30.0–36.0)
MCV: 78 fL — ABNORMAL LOW (ref 80.0–100.0)
Monocytes Absolute: 0.6 K/uL (ref 0.1–1.0)
Monocytes Relative: 5 %
Neutro Abs: 9.6 K/uL — ABNORMAL HIGH (ref 1.7–7.7)
Neutrophils Relative %: 87 %
Platelets: 294 K/uL (ref 150–400)
RBC: 5.27 MIL/uL — ABNORMAL HIGH (ref 3.87–5.11)
RDW: 13.2 % (ref 11.5–15.5)
WBC: 11.1 K/uL — ABNORMAL HIGH (ref 4.0–10.5)
nRBC: 0 % (ref 0.0–0.2)

## 2023-08-25 LAB — HCG, SERUM, QUALITATIVE: Preg, Serum: NEGATIVE

## 2023-08-25 LAB — D-DIMER, QUANTITATIVE: D-Dimer, Quant: <0.27 ug{FEU}/mL (ref 0.00–0.50)

## 2023-08-25 LAB — LIPASE, BLOOD: Lipase: 25 U/L (ref 11–51)

## 2023-08-25 MED ORDER — IOHEXOL 300 MG/ML  SOLN
100.0000 mL | Freq: Once | INTRAMUSCULAR | Status: AC | PRN
  Administered 2023-08-25 (×2): 100 mL via INTRAVENOUS

## 2023-08-25 MED ORDER — ONDANSETRON HCL 4 MG/2ML IJ SOLN
4.0000 mg | Freq: Once | INTRAMUSCULAR | Status: AC
  Administered 2023-08-25 (×2): 4 mg via INTRAVENOUS
  Filled 2023-08-25: qty 2

## 2023-08-25 MED ORDER — SODIUM CHLORIDE 0.9 % IV BOLUS
500.0000 mL | Freq: Once | INTRAVENOUS | Status: AC
  Administered 2023-08-25 (×2): 500 mL via INTRAVENOUS

## 2023-08-25 NOTE — ED Provider Notes (Signed)
 Physical Exam   Vitals:   08/25/23 1310 08/25/23 1312 08/25/23 1548 08/25/23 1916  BP:  120/79 108/69 112/68  Pulse:  (!) 109 (!) 111 99  Resp:  18 19 18   Temp:  98.5 F (36.9 C) 98.2 F (36.8 C) 97.9 F (36.6 C)  TempSrc:  Oral Oral Axillary  SpO2: 100% 100% 100% 97%     Physical Exam  Procedures  Procedures  ED Course / MDM    Medical Decision Making Amount and/or Complexity of Data Reviewed Labs: ordered. Radiology: ordered.  Risk Prescription drug management.   Patient received at shift change from prior PA-C Jamie Barrett, see their note for initial history, physical exam findings, labs/imaging interpretation, medication management, and assessment/plan.  Patient presenting with nausea/vomiting since today, history of cerebral palsy/spastic quadriplegia/encephalopathy. Patient is accompanied by her caregiver who provides collateral information as she is nonverbal.    Dispo pending CT abdomen/pelvis and p.o. challenge. Added on D-dimer, as patient is persistently tachycardic despite receiving fluid bolus, d-dimer negative.  She does not meet SIRS criteria, with normal RR, WBC < 12,000, and afebrile.  CT abdomen/pelvis: 1. Image quality degraded due to beam hardening artifact from extensive spinal fusion hardware. 2. No acute findings in the abdomen or pelvis. Specifically, no findings to explain the patient's history of abdominal pain with nausea and vomiting. 3. Prominent stool volume in the rectum with colon otherwise largely decompressed. 4. Fluid in the vagina, potentially physiologic. 5. Small focus of peripheral opacity in the posterior left lower lobe is stable since prior. This may reflect atelectasis or infiltrate although given persistence, scar would also be a consideration. Given patient age, neoplasm is considered unlikely.  At time of my reassessment, I spoke with the patient's caregiver, she has not had an additional episode of vomiting since  presenting to the emergency department today, had 2 episodes of vomiting this morning.  I discussed CT findings of prominent stool volume, her caregiver states that she does take stool softener daily and receives a suppository if she has gone more than 3 days without a bowel movement, her last BM was yesterday.  I do not feel that any additional intervention is necessary in regard to this, as she is not obstructed nor impacted.    At time of my re-assessment, patient's urine had yet to be collected by nursing staff. Patient is non-verbal and would require catheterization for collection of urine sample, at this point I do not feel that it is necessary to collect her urine sample as I have a very low suspicion for UTI, she is afebrile with largely unremarkable labs and is at her baseline mentally, according to her caregiver. I discussed this with the patient's caregiver who is in agreement and defers collection of urine sample today, which I feel is reasonable. I requested that nursing staff PO challenge her, but they state that she only has thickened liquids, like the consistency of applesauce or ice cream, which they do not have at this facility at this time. After discussing this with nursing staff multiple times, the PO challenge was not done. However, she has not had any additional vomiting since presenting to this ED today. Given these above findings, I will hold off on PO challenge as she cannot tolerate thin liquids and unfortunately that is all we have access to at this time.  She is appropriate for discharge at this time, strict return precautions discussed with her caregiver and they are in agreement with this plan.  Glendia Rocky SAILOR, [place] 08/25/23 1940    Francesca Elsie CROME, MD 08/25/23 [phone]

## 2023-08-25 NOTE — ED Provider Notes (Signed)
 [place] EMERGENCY DEPARTMENT AT [facility] Provider Note   CSN: [id number] Arrival date & time: 08/25/23  1250     Patient presents with: Nausea and Emesis   Sheila Costa is a 38 y.o. female.  With past medical history of encephalopathy, seizure, spastic quadriplegia, cerebral palsy is presenting to emergency room with complaint of emesis.  Patient's care coordinator here with her.  Reports that patient has had emesis after eating her last 2 meals that started yesterday.  Patient has had decreased appetite and shortly after eating or drinking will vomit everything back up.  Her last bowel movement was yesterday.  Caregiver reports it was smaller than normal but no diarrhea.  No sick contacts or recent antibiotic use.  Has had history of similar.    Emesis      Prior to Admission medications   Medication Sig Start Date End Date Taking? Authorizing Provider  baclofen  (LIORESAL ) 10 MG tablet Take 10-20 mg by mouth See admin instructions. 10 mg in the morning, then 20 mg twice daily at 1200 and 2000    [provider]  benzoyl peroxide  10 % gel Apply 1 application topically every evening.    [provider]  Carbamazepine  (EQUETRO ) 300 MG CP12 Take 600-900 mg by mouth See admin instructions. 600 mg every morning and 900 mg every night    [provider]  chlorhexidine  (PERIDEX ) 0.12 % solution Use as directed 10 mLs in the mouth or throat at bedtime.     [provider]  cholecalciferol  (VITAMIN D ) 1000 UNITS tablet Take 2,000 Units by mouth daily.    [provider]  dicyclomine  (BENTYL ) 20 MG tablet Take 1 tablet (20 mg total) by mouth 2 (two) times daily as needed for spasms. Patient not taking: No sig reported 02/22/16   Carlyle Lenis, MD  diphenhydrAMINE  (BENADRYL ) 25 MG tablet Take 25 mg by mouth at bedtime.    [provider]  levOCARNitine  (CARNITOR ) 1 GM/10ML solution Take 500 mg by mouth at bedtime. 04/21/20    [provider]  magnesium  oxide (MAG-OX) 400 (241.3 Mg) MG tablet Take 400 mg by mouth 3 (three) times daily.    [provider]  Multiple Vitamins-Minerals (MULTIVITAMIN WITH MINERALS) tablet Take 1 tablet by mouth daily.    [provider]  Nutritional Supplements (ENSURE ENLIVE PO) Take 237 mLs by mouth See admin instructions. With lunch on Monday's, Wednesday's, and Sunday's    [provider]  ondansetron  (ZOFRAN -ODT) 4 MG disintegrating tablet 4mg  ODT q4 hours prn nausea/vomit 08/20/22   Yao, David Hsienta, MD  polyethylene glycol (MIRALAX  / GLYCOLAX ) packet Take 17 g by mouth daily. Mix with 4 ounces of prune juice.    [provider]  promethazine  (PHENERGAN ) 25 MG suppository Place 25 mg rectally See admin instructions. 25 mg every 4 to 6 hours as needed for vomiting 2 or more times in 4 hours 02/16/16   [provider]  promethazine  (PHENERGAN ) 25 MG tablet Take 25 mg by mouth See admin instructions. 25 mg every 4 to 6 hours as needed for vomiting 2 or more times in 4 hours 01/27/20   [provider]  scopolamine  (TRANSDERM-SCOP) 1.5 MG Place 1 patch onto the skin every 3 (three) days. Behind ear.    [provider]  senna (SENOKOT) 8.6 MG TABS Take 1 tablet by mouth 2 (two) times daily.     [provider]  triazolam (HALCION) 0.25 MG tablet Take 0.25  mg by mouth See admin instructions. 0.25 mg prior to dental procedure as directed by the [facility] 01/11/20   [provider]  TRULANCE 3 MG TABS Take 3 mg by mouth daily. 04/07/20   [provider]  valproic  acid (DEPAKENE ) 250 MG/5ML solution Take 10 mLs (500 mg total) by mouth 3 (three) times daily. 11/19/22 12/19/22  Carita Senior, MD    Allergies: Patient has no known allergies.    Review of Systems  Gastrointestinal:  Positive for vomiting.    Updated Vital Signs BP 120/79 (BP Location: Left Arm)   Pulse (!) 109   Temp  98.5 F (36.9 C) (Oral)   Resp 18   SpO2 100%   Physical Exam Vitals and nursing note reviewed.  Constitutional:      General: She is not in acute distress.    Appearance: She is not toxic-appearing.     Comments: Patient nonverbal.  Appears comfortable in room.  HENT:     Head: Normocephalic and atraumatic.  Eyes:     General: No scleral icterus.    Conjunctiva/sclera: Conjunctivae normal.  Cardiovascular:     Rate and Rhythm: Normal rate and regular rhythm.     Pulses: Normal pulses.     Heart sounds: Normal heart sounds.  Pulmonary:     Effort: Pulmonary effort is normal. No respiratory distress.     Breath sounds: Normal breath sounds.  Abdominal:     General: Abdomen is flat. Bowel sounds are normal.     Palpations: Abdomen is soft.     Tenderness: There is no abdominal tenderness.     Comments: Abdomen is soft.  Nondistended.  No rash.  Musculoskeletal:     Right lower leg: No edema.     Left lower leg: No edema.  Skin:    General: Skin is warm and dry.     Findings: No lesion.  Neurological:     General: No focal deficit present.     Mental Status: She is alert and oriented to person, place, and time. Mental status is at baseline.     (all labs ordered are listed, but only abnormal results are displayed) Labs Reviewed  COMPREHENSIVE METABOLIC PANEL WITH GFR - Abnormal; Notable for the following components:      Result Value   Sodium 130 (*)    Chloride 94 (*)    Glucose, Bld 125 (*)    Creatinine, Ser 0.36 (*)    All other components within normal limits  CBC WITH DIFFERENTIAL/PLATELET - Abnormal; Notable for the following components:   WBC 11.1 (*)    RBC 5.27 (*)    MCV 78.0 (*)    MCH 25.2 (*)    Neutro Abs 9.6 (*)    All other components within normal limits  LIPASE, BLOOD  URINALYSIS, ROUTINE W REFLEX MICROSCOPIC  HCG, SERUM, QUALITATIVE    EKG: None  Radiology: No results found.   Procedures   Medications Ordered in the ED - No data  to display                                  Medical Decision Making Amount and/or Complexity of Data Reviewed Labs: ordered. Radiology: ordered.  Risk Prescription drug management.   This patient presents to the ED for concern of nausea vomiting, this involves an extensive number of treatment options, and is a complaint that carries with it  a high risk of complications and morbidity.  The differential diagnosis includes bowel obstruction, cholecystitis, appendicitis, gastritis, gastroenteritis   Co morbidities that complicate the patient evaluation  Cerebral palsy   Lab Tests:  I personally interpreted labs.  The pertinent results include:   Patient has mild leukocytosis at 11, no significant anemia.   Imaging Studies ordered:  I ordered imaging studies including CT scan of abdomen pelvis Pending at time of sign out.    Cardiac Monitoring: / EKG:  The patient was maintained on a cardiac monitor.    Problem List / ED Course / Critical interventions / Medication management  Patient presents with complaint of nausea and vomiting.  This started yesterday.  Patient is nonverbal, but otherwise acting at baseline per caregiver.  In the room patient's vitals are stable.  She does not have fever.  She is mildly tachycardic at around 105.  She is not actively vomiting.  She did not have any vomiting with EMS.  Plan is to check labs.  Check CT abdomen pelvis to rule out significant bowel obstruction or other acute pathology.  Will give normal saline and Zofran  then reassess. I ordered medication including Zofran , normal saline Reevaluation of the patient after these medicines showed that the patient improved I have reviewed the patients home medicines and have made adjustments as needed. Signed out to Erin at shift change pending CT abdomen pelvis and reassessment.       Final diagnoses:  None    ED Discharge Orders     None          Shermon Warren SAILOR, PA-C 08/25/23  1511    Neysa Caron PARAS, DO 08/25/23 1526

## 2023-08-25 NOTE — ED Triage Notes (Signed)
 Pt BIB ems for nausea and vomiting starting today. Pt has cerebral palsy. She usually wears a patch behind her ear for nausea. She has it on today but she still has nausea and vomiting. Ems stated that she did not vomit on the way here though.

## 2023-08-25 NOTE — Discharge Instructions (Signed)
Return to the emergency department if symptoms persist or worsen.

## 2023-08-25 NOTE — ED Notes (Addendum)
 Attempted to call legal guardian, Lonell Bunker at [phone] to give her an update, phone number not in service and unreachable, attempt unsuccessful.

## 2023-08-25 NOTE — ED Notes (Signed)
 [facility] called for transportation back to [facility].
